# Patient Record
Sex: Male | Born: 1935
Health system: Southern US, Community
[De-identification: ages and names within clinical notes are randomized; demographics above are authoritative.]

## PROBLEM LIST (undated history)

## (undated) DIAGNOSIS — M109 Gout, unspecified: Secondary | ICD-10-CM

## (undated) DIAGNOSIS — F419 Anxiety disorder, unspecified: Secondary | ICD-10-CM

## (undated) DIAGNOSIS — I10 Essential (primary) hypertension: Secondary | ICD-10-CM

## (undated) DIAGNOSIS — N529 Male erectile dysfunction, unspecified: Secondary | ICD-10-CM

## (undated) DIAGNOSIS — G4700 Insomnia, unspecified: Secondary | ICD-10-CM

## (undated) DIAGNOSIS — F32A Depression, unspecified: Secondary | ICD-10-CM

## (undated) DIAGNOSIS — N400 Enlarged prostate without lower urinary tract symptoms: Secondary | ICD-10-CM

## (undated) DIAGNOSIS — F329 Major depressive disorder, single episode, unspecified: Secondary | ICD-10-CM

## (undated) DIAGNOSIS — E785 Hyperlipidemia, unspecified: Secondary | ICD-10-CM

## (undated) HISTORY — DX: Depression, unspecified: F32.A

## (undated) HISTORY — DX: Insomnia, unspecified: G47.00

## (undated) HISTORY — DX: Major depressive disorder, single episode, unspecified: F32.9

## (undated) HISTORY — DX: Anxiety disorder, unspecified: F41.9

## (undated) HISTORY — DX: Benign prostatic hyperplasia without lower urinary tract symptoms: N40.0

## (undated) HISTORY — DX: Hyperlipidemia, unspecified: E78.5

## (undated) HISTORY — DX: Male erectile dysfunction, unspecified: N52.9

## (undated) HISTORY — DX: Gout, unspecified: M10.9

## (undated) HISTORY — DX: Essential (primary) hypertension: I10

---

## 2007-03-26 ENCOUNTER — Ambulatory Visit: Payer: Self-pay | Admitting: Gastroenterology

## 2007-03-26 LAB — HM COLONOSCOPY

## 2012-07-18 ENCOUNTER — Encounter: Payer: Self-pay | Admitting: Nurse Practitioner

## 2012-07-18 ENCOUNTER — Encounter: Payer: Self-pay | Admitting: Cardiothoracic Surgery

## 2014-12-01 DIAGNOSIS — I10 Essential (primary) hypertension: Secondary | ICD-10-CM | POA: Diagnosis not present

## 2014-12-01 DIAGNOSIS — I129 Hypertensive chronic kidney disease with stage 1 through stage 4 chronic kidney disease, or unspecified chronic kidney disease: Secondary | ICD-10-CM | POA: Diagnosis not present

## 2014-12-01 DIAGNOSIS — F17200 Nicotine dependence, unspecified, uncomplicated: Secondary | ICD-10-CM | POA: Diagnosis not present

## 2014-12-01 DIAGNOSIS — E785 Hyperlipidemia, unspecified: Secondary | ICD-10-CM | POA: Diagnosis not present

## 2014-12-01 DIAGNOSIS — G4700 Insomnia, unspecified: Secondary | ICD-10-CM | POA: Diagnosis not present

## 2014-12-01 DIAGNOSIS — Z0000 Encounter for general adult medical examination without abnormal findings: Secondary | ICD-10-CM | POA: Diagnosis not present

## 2014-12-01 DIAGNOSIS — R8299 Other abnormal findings in urine: Secondary | ICD-10-CM | POA: Diagnosis not present

## 2015-05-05 DIAGNOSIS — I10 Essential (primary) hypertension: Secondary | ICD-10-CM | POA: Diagnosis not present

## 2015-05-05 DIAGNOSIS — E785 Hyperlipidemia, unspecified: Secondary | ICD-10-CM | POA: Diagnosis not present

## 2015-05-05 DIAGNOSIS — S51812A Laceration without foreign body of left forearm, initial encounter: Secondary | ICD-10-CM | POA: Diagnosis not present

## 2015-05-05 DIAGNOSIS — L233 Allergic contact dermatitis due to drugs in contact with skin: Secondary | ICD-10-CM | POA: Diagnosis not present

## 2015-05-05 DIAGNOSIS — T490X5A Adverse effect of local antifungal, anti-infective and anti-inflammatory drugs, initial encounter: Secondary | ICD-10-CM | POA: Diagnosis not present

## 2015-05-05 DIAGNOSIS — Z886 Allergy status to analgesic agent status: Secondary | ICD-10-CM | POA: Diagnosis not present

## 2015-05-05 DIAGNOSIS — S51822A Laceration with foreign body of left forearm, initial encounter: Secondary | ICD-10-CM | POA: Diagnosis not present

## 2015-05-05 DIAGNOSIS — R21 Rash and other nonspecific skin eruption: Secondary | ICD-10-CM | POA: Diagnosis not present

## 2015-05-05 DIAGNOSIS — L03114 Cellulitis of left upper limb: Secondary | ICD-10-CM | POA: Diagnosis not present

## 2015-05-05 DIAGNOSIS — Z792 Long term (current) use of antibiotics: Secondary | ICD-10-CM | POA: Diagnosis not present

## 2015-05-05 DIAGNOSIS — Z66 Do not resuscitate: Secondary | ICD-10-CM | POA: Diagnosis not present

## 2015-05-05 DIAGNOSIS — M799 Soft tissue disorder, unspecified: Secondary | ICD-10-CM | POA: Diagnosis not present

## 2015-05-05 DIAGNOSIS — Z7952 Long term (current) use of systemic steroids: Secondary | ICD-10-CM | POA: Diagnosis not present

## 2015-05-06 DIAGNOSIS — S51812A Laceration without foreign body of left forearm, initial encounter: Secondary | ICD-10-CM | POA: Insufficient documentation

## 2015-05-06 DIAGNOSIS — L03114 Cellulitis of left upper limb: Secondary | ICD-10-CM | POA: Insufficient documentation

## 2015-05-06 DIAGNOSIS — R21 Rash and other nonspecific skin eruption: Secondary | ICD-10-CM | POA: Insufficient documentation

## 2015-05-11 ENCOUNTER — Telehealth: Payer: Self-pay | Admitting: Unknown Physician Specialty

## 2015-05-11 DIAGNOSIS — R21 Rash and other nonspecific skin eruption: Secondary | ICD-10-CM | POA: Diagnosis not present

## 2015-05-11 DIAGNOSIS — L03114 Cellulitis of left upper limb: Secondary | ICD-10-CM | POA: Diagnosis not present

## 2015-05-11 DIAGNOSIS — I10 Essential (primary) hypertension: Secondary | ICD-10-CM | POA: Diagnosis not present

## 2015-05-11 DIAGNOSIS — S51812D Laceration without foreign body of left forearm, subsequent encounter: Secondary | ICD-10-CM | POA: Diagnosis not present

## 2015-05-11 NOTE — Telephone Encounter (Signed)
I looked up this patient in practice partner ([phone]) and I do not see anywhere that we have ever given him tramadol. Elnita MaxwellCheryl can you look and double check on this please?

## 2015-05-11 NOTE — Telephone Encounter (Signed)
[facility] was discharged form [facility] and has misplaced a rx for ultram and was wondering what he needed to do. He has already called [facility] but got the run around.

## 2015-05-11 NOTE — Telephone Encounter (Signed)
Called and spoke to a woman who said that the patient was with a nurse right now. I asked for the patient to call us back when he could.

## 2015-05-11 NOTE — Telephone Encounter (Signed)
I have not.  [facility]  I will not prescribe without an evaluation.

## 2015-05-13 DIAGNOSIS — I10 Essential (primary) hypertension: Secondary | ICD-10-CM | POA: Diagnosis not present

## 2015-05-13 DIAGNOSIS — R21 Rash and other nonspecific skin eruption: Secondary | ICD-10-CM | POA: Diagnosis not present

## 2015-05-13 DIAGNOSIS — S51812D Laceration without foreign body of left forearm, subsequent encounter: Secondary | ICD-10-CM | POA: Diagnosis not present

## 2015-05-13 DIAGNOSIS — L03114 Cellulitis of left upper limb: Secondary | ICD-10-CM | POA: Diagnosis not present

## 2015-05-13 NOTE — Telephone Encounter (Signed)
Called and left patient a voicemail asking for him to please return my call.  

## 2015-05-14 ENCOUNTER — Other Ambulatory Visit: Payer: Self-pay | Admitting: Unknown Physician Specialty

## 2015-05-16 NOTE — Telephone Encounter (Signed)
Called and left patient a voicemail asking for him to please return my call.  

## 2015-05-16 NOTE — Telephone Encounter (Signed)
Pt has appt with [facility] tomorrowand will take care of everything tomorrow

## 2015-05-17 DIAGNOSIS — S41112S Laceration without foreign body of left upper arm, sequela: Secondary | ICD-10-CM | POA: Diagnosis not present

## 2015-05-19 DIAGNOSIS — R21 Rash and other nonspecific skin eruption: Secondary | ICD-10-CM | POA: Diagnosis not present

## 2015-05-19 DIAGNOSIS — I10 Essential (primary) hypertension: Secondary | ICD-10-CM | POA: Diagnosis not present

## 2015-05-19 DIAGNOSIS — S51812D Laceration without foreign body of left forearm, subsequent encounter: Secondary | ICD-10-CM | POA: Diagnosis not present

## 2015-05-19 DIAGNOSIS — L03114 Cellulitis of left upper limb: Secondary | ICD-10-CM | POA: Diagnosis not present

## 2015-05-26 DIAGNOSIS — E291 Testicular hypofunction: Secondary | ICD-10-CM

## 2015-05-26 DIAGNOSIS — N400 Enlarged prostate without lower urinary tract symptoms: Secondary | ICD-10-CM | POA: Insufficient documentation

## 2015-05-26 DIAGNOSIS — E785 Hyperlipidemia, unspecified: Secondary | ICD-10-CM | POA: Insufficient documentation

## 2015-05-26 DIAGNOSIS — F32A Depression, unspecified: Secondary | ICD-10-CM | POA: Insufficient documentation

## 2015-05-26 DIAGNOSIS — F329 Major depressive disorder, single episode, unspecified: Secondary | ICD-10-CM | POA: Insufficient documentation

## 2015-05-26 DIAGNOSIS — G4700 Insomnia, unspecified: Secondary | ICD-10-CM | POA: Insufficient documentation

## 2015-05-26 DIAGNOSIS — F419 Anxiety disorder, unspecified: Secondary | ICD-10-CM

## 2015-05-26 DIAGNOSIS — M109 Gout, unspecified: Secondary | ICD-10-CM

## 2015-05-26 DIAGNOSIS — I10 Essential (primary) hypertension: Secondary | ICD-10-CM

## 2015-06-04 ENCOUNTER — Other Ambulatory Visit: Payer: Self-pay | Admitting: Unknown Physician Specialty

## 2015-06-08 ENCOUNTER — Ambulatory Visit: Payer: Self-pay | Admitting: Unknown Physician Specialty

## 2015-06-25 ENCOUNTER — Other Ambulatory Visit: Payer: Self-pay | Admitting: Unknown Physician Specialty

## 2015-10-22 ENCOUNTER — Other Ambulatory Visit: Payer: Self-pay | Admitting: Family Medicine

## 2015-10-23 ENCOUNTER — Other Ambulatory Visit: Payer: Self-pay | Admitting: Unknown Physician Specialty

## 2015-10-24 ENCOUNTER — Encounter: Payer: Self-pay | Admitting: Family Medicine

## 2015-10-24 NOTE — Telephone Encounter (Signed)
apt 

## 2015-10-24 NOTE — Telephone Encounter (Signed)
Letter sent.

## 2015-11-21 ENCOUNTER — Other Ambulatory Visit: Payer: Self-pay | Admitting: Family Medicine

## 2016-10-15 ENCOUNTER — Telehealth: Payer: Self-pay

## 2016-10-15 NOTE — Telephone Encounter (Signed)
Attempted to reach to schedule for Medicare Wellness. Message left for pt to return call.   

## 2018-07-10 DIAGNOSIS — H5203 Hypermetropia, bilateral: Secondary | ICD-10-CM | POA: Diagnosis not present

## 2018-07-10 DIAGNOSIS — H25813 Combined forms of age-related cataract, bilateral: Secondary | ICD-10-CM | POA: Diagnosis not present

## 2018-08-05 DIAGNOSIS — H25813 Combined forms of age-related cataract, bilateral: Secondary | ICD-10-CM | POA: Diagnosis not present

## 2018-12-08 ENCOUNTER — Inpatient Hospital Stay
Admission: EM | Admit: 2018-12-08 | Discharge: 2018-12-12 | DRG: 291 | Disposition: A | Discharge status: Home / Self Care | Payer: Medicare Other | Attending: Internal Medicine | Admitting: Internal Medicine

## 2018-12-08 ENCOUNTER — Emergency Department: Payer: Medicare Other

## 2018-12-08 ENCOUNTER — Ambulatory Visit: Payer: Self-pay | Admitting: *Deleted

## 2018-12-08 ENCOUNTER — Encounter: Payer: Self-pay | Admitting: Emergency Medicine

## 2018-12-08 ENCOUNTER — Inpatient Hospital Stay
Admit: 2018-12-08 | Discharge: 2018-12-08 | Disposition: A | Discharge status: Home / Self Care | Payer: Medicare Other | Attending: Nurse Practitioner | Admitting: Nurse Practitioner

## 2018-12-08 ENCOUNTER — Other Ambulatory Visit: Payer: Self-pay

## 2018-12-08 DIAGNOSIS — F419 Anxiety disorder, unspecified: Secondary | ICD-10-CM | POA: Diagnosis present

## 2018-12-08 DIAGNOSIS — M109 Gout, unspecified: Secondary | ICD-10-CM | POA: Diagnosis present

## 2018-12-08 DIAGNOSIS — Z881 Allergy status to other antibiotic agents status: Secondary | ICD-10-CM

## 2018-12-08 DIAGNOSIS — J9801 Acute bronchospasm: Secondary | ICD-10-CM | POA: Diagnosis present

## 2018-12-08 DIAGNOSIS — F329 Major depressive disorder, single episode, unspecified: Secondary | ICD-10-CM | POA: Diagnosis present

## 2018-12-08 DIAGNOSIS — J9811 Atelectasis: Secondary | ICD-10-CM | POA: Diagnosis not present

## 2018-12-08 DIAGNOSIS — Z66 Do not resuscitate: Secondary | ICD-10-CM | POA: Diagnosis present

## 2018-12-08 DIAGNOSIS — I10 Essential (primary) hypertension: Secondary | ICD-10-CM | POA: Diagnosis not present

## 2018-12-08 DIAGNOSIS — F1010 Alcohol abuse, uncomplicated: Secondary | ICD-10-CM | POA: Diagnosis present

## 2018-12-08 DIAGNOSIS — J449 Chronic obstructive pulmonary disease, unspecified: Secondary | ICD-10-CM | POA: Diagnosis present

## 2018-12-08 DIAGNOSIS — Z885 Allergy status to narcotic agent status: Secondary | ICD-10-CM

## 2018-12-08 DIAGNOSIS — J90 Pleural effusion, not elsewhere classified: Secondary | ICD-10-CM | POA: Diagnosis not present

## 2018-12-08 DIAGNOSIS — Z79890 Hormone replacement therapy (postmenopausal): Secondary | ICD-10-CM

## 2018-12-08 DIAGNOSIS — R000 Tachycardia, unspecified: Secondary | ICD-10-CM | POA: Diagnosis not present

## 2018-12-08 DIAGNOSIS — J9600 Acute respiratory failure, unspecified whether with hypoxia or hypercapnia: Secondary | ICD-10-CM | POA: Diagnosis present

## 2018-12-08 DIAGNOSIS — I4891 Unspecified atrial fibrillation: Secondary | ICD-10-CM | POA: Diagnosis present

## 2018-12-08 DIAGNOSIS — I110 Hypertensive heart disease with heart failure: Secondary | ICD-10-CM | POA: Diagnosis not present

## 2018-12-08 DIAGNOSIS — Z79899 Other long term (current) drug therapy: Secondary | ICD-10-CM | POA: Diagnosis not present

## 2018-12-08 DIAGNOSIS — E785 Hyperlipidemia, unspecified: Secondary | ICD-10-CM | POA: Diagnosis present

## 2018-12-08 DIAGNOSIS — Z87891 Personal history of nicotine dependence: Secondary | ICD-10-CM | POA: Diagnosis not present

## 2018-12-08 DIAGNOSIS — G4700 Insomnia, unspecified: Secondary | ICD-10-CM | POA: Diagnosis not present

## 2018-12-08 DIAGNOSIS — I429 Cardiomyopathy, unspecified: Secondary | ICD-10-CM | POA: Diagnosis not present

## 2018-12-08 DIAGNOSIS — I5021 Acute systolic (congestive) heart failure: Secondary | ICD-10-CM | POA: Diagnosis present

## 2018-12-08 DIAGNOSIS — N179 Acute kidney failure, unspecified: Secondary | ICD-10-CM | POA: Diagnosis present

## 2018-12-08 DIAGNOSIS — I5033 Acute on chronic diastolic (congestive) heart failure: Secondary | ICD-10-CM | POA: Diagnosis not present

## 2018-12-08 DIAGNOSIS — J9601 Acute respiratory failure with hypoxia: Secondary | ICD-10-CM | POA: Diagnosis not present

## 2018-12-08 DIAGNOSIS — Z20828 Contact with and (suspected) exposure to other viral communicable diseases: Secondary | ICD-10-CM | POA: Diagnosis present

## 2018-12-08 DIAGNOSIS — R0602 Shortness of breath: Secondary | ICD-10-CM

## 2018-12-08 DIAGNOSIS — R079 Chest pain, unspecified: Secondary | ICD-10-CM | POA: Diagnosis not present

## 2018-12-08 DIAGNOSIS — I480 Paroxysmal atrial fibrillation: Secondary | ICD-10-CM | POA: Diagnosis not present

## 2018-12-08 DIAGNOSIS — N400 Enlarged prostate without lower urinary tract symptoms: Secondary | ICD-10-CM | POA: Diagnosis present

## 2018-12-08 DIAGNOSIS — Z7982 Long term (current) use of aspirin: Secondary | ICD-10-CM

## 2018-12-08 LAB — CBC WITH DIFFERENTIAL/PLATELET
Abs Immature Granulocytes: 0.05 10*3/uL (ref 0.00–0.07)
Basophils Absolute: 0.1 10*3/uL (ref 0.0–0.1)
Basophils Relative: 0 %
Eosinophils Absolute: 0.2 10*3/uL (ref 0.0–0.5)
Eosinophils Relative: 2 %
HCT: 46.7 % (ref 39.0–52.0)
Hemoglobin: 15.4 g/dL (ref 13.0–17.0)
Immature Granulocytes: 0 %
Lymphocytes Relative: 11 %
Lymphs Abs: 1.5 10*3/uL (ref 0.7–4.0)
MCH: 31.6 pg (ref 26.0–34.0)
MCHC: 33 g/dL (ref 30.0–36.0)
MCV: 95.7 fL (ref 80.0–100.0)
Monocytes Absolute: 1.2 10*3/uL — ABNORMAL HIGH (ref 0.1–1.0)
Monocytes Relative: 9 %
Neutro Abs: 10.8 10*3/uL — ABNORMAL HIGH (ref 1.7–7.7)
Neutrophils Relative %: 78 %
Platelets: 253 10*3/uL (ref 150–400)
RBC: 4.88 MIL/uL (ref 4.22–5.81)
RDW: 12.7 % (ref 11.5–15.5)
WBC: 13.9 10*3/uL — ABNORMAL HIGH (ref 4.0–10.5)
nRBC: 0 % (ref 0.0–0.2)

## 2018-12-08 LAB — COMPREHENSIVE METABOLIC PANEL
ALT: 18 U/L (ref 0–44)
AST: 24 U/L (ref 15–41)
Albumin: 4.1 g/dL (ref 3.5–5.0)
Alkaline Phosphatase: 53 U/L (ref 38–126)
Anion gap: 15 (ref 5–15)
BUN: 28 mg/dL — ABNORMAL HIGH (ref 8–23)
CO2: 18 mmol/L — ABNORMAL LOW (ref 22–32)
Calcium: 8.6 mg/dL — ABNORMAL LOW (ref 8.9–10.3)
Chloride: 108 mmol/L (ref 98–111)
Creatinine, Ser: 1.48 mg/dL — ABNORMAL HIGH (ref 0.61–1.24)
GFR calc Af Amer: 50 mL/min — ABNORMAL LOW (ref >60)
GFR calc non Af Amer: 43 mL/min — ABNORMAL LOW (ref >60)
Glucose, Bld: 129 mg/dL — ABNORMAL HIGH (ref 70–99)
Potassium: 3.8 mmol/L (ref 3.5–5.1)
Sodium: 141 mmol/L (ref 135–145)
Total Bilirubin: 1.1 mg/dL (ref 0.3–1.2)
Total Protein: 7 g/dL (ref 6.5–8.1)

## 2018-12-08 LAB — TROPONIN I
Troponin I: <0.03 ng/mL (ref <0.03)
Troponin I: <0.03 ng/mL (ref <0.03)
Troponin I: <0.03 ng/mL (ref <0.03)

## 2018-12-08 LAB — PROTIME-INR
INR: 1.1 (ref 0.8–1.2)
Prothrombin Time: 14 seconds (ref 11.4–15.2)

## 2018-12-08 LAB — SARS CORONAVIRUS 2 BY RT PCR (HOSPITAL ORDER, PERFORMED IN ~~LOC~~ HOSPITAL LAB): SARS Coronavirus 2: NEGATIVE

## 2018-12-08 LAB — URINE DRUG SCREEN, QUALITATIVE (ARMC ONLY)
Amphetamines, Ur Screen: NOT DETECTED
Barbiturates, Ur Screen: NOT DETECTED
Benzodiazepine, Ur Scrn: NOT DETECTED
Cannabinoid 50 Ng, Ur ~~LOC~~: NOT DETECTED
Cocaine Metabolite,Ur ~~LOC~~: NOT DETECTED
MDMA (Ecstasy)Ur Screen: NOT DETECTED
Methadone Scn, Ur: NOT DETECTED
Opiate, Ur Screen: NOT DETECTED
Phencyclidine (PCP) Ur S: NOT DETECTED
Tricyclic, Ur Screen: NOT DETECTED

## 2018-12-08 LAB — URINALYSIS, COMPLETE (UACMP) WITH MICROSCOPIC
Bilirubin Urine: NEGATIVE
Glucose, UA: NEGATIVE mg/dL
Hgb urine dipstick: NEGATIVE
Ketones, ur: 20 mg/dL — AB
Nitrite: NEGATIVE
Protein, ur: 30 mg/dL — AB
Specific Gravity, Urine: 1.021 (ref 1.005–1.030)
WBC, UA: >50 WBC/hpf — ABNORMAL HIGH (ref 0–5)
pH: 5 (ref 5.0–8.0)

## 2018-12-08 LAB — APTT: aPTT: 33 seconds (ref 24–36)

## 2018-12-08 LAB — ECHOCARDIOGRAM COMPLETE
Height: 66 in
Weight: 2464 oz

## 2018-12-08 MED ORDER — ONDANSETRON HCL 4 MG/2ML IJ SOLN: 4.0000 mg | Freq: Four times a day (QID) | INTRAMUSCULAR | Status: DC | PRN

## 2018-12-08 MED ORDER — ENOXAPARIN SODIUM 100 MG/ML ~~LOC~~ SOLN
1.0000 mg/kg | Freq: Two times a day (BID) | SUBCUTANEOUS | Status: DC
  Administered 2018-12-08 – 2018-12-09 (×2): 95 mg via SUBCUTANEOUS
  Filled 2018-12-08 (×2): qty 1

## 2018-12-08 MED ORDER — DILTIAZEM HCL 100 MG IV SOLR
5.0000 mg/h | INTRAVENOUS | Status: DC
  Administered 2018-12-08: 12.5 mg/h via INTRAVENOUS
  Administered 2018-12-08: 10 mg/h via INTRAVENOUS
  Administered 2018-12-09: 12.5 mg/h via INTRAVENOUS
  Filled 2018-12-08 (×3): qty 100

## 2018-12-08 MED ORDER — ZOLPIDEM TARTRATE 5 MG PO TABS
5.0000 mg | ORAL_TABLET | Freq: Every evening | ORAL | Status: DC | PRN
  Administered 2018-12-08 – 2018-12-10 (×3): 5 mg via ORAL
  Filled 2018-12-08 (×4): qty 1

## 2018-12-08 MED ORDER — FERROUS GLUCONATE 324 (38 FE) MG PO TABS
324.0000 mg | ORAL_TABLET | Freq: Every day | ORAL | Status: DC
  Administered 2018-12-09 – 2018-12-12 (×4): 324 mg via ORAL
  Filled 2018-12-08 (×4): qty 1

## 2018-12-08 MED ORDER — DILTIAZEM HCL 25 MG/5ML IV SOLN
15.0000 mg | Freq: Once | INTRAVENOUS | Status: AC
  Administered 2018-12-08: 15 mg via INTRAVENOUS
  Filled 2018-12-08: qty 5

## 2018-12-08 MED ORDER — ACETAMINOPHEN 325 MG PO TABS
650.0000 mg | ORAL_TABLET | ORAL | Status: DC | PRN
  Administered 2018-12-08 – 2018-12-12 (×4): 650 mg via ORAL
  Filled 2018-12-08 (×4): qty 2

## 2018-12-08 MED ORDER — SIMVASTATIN 10 MG PO TABS
20.0000 mg | ORAL_TABLET | Freq: Every day | ORAL | Status: DC
  Administered 2018-12-08 – 2018-12-11 (×4): 20 mg via ORAL
  Filled 2018-12-08 (×6): qty 2

## 2018-12-08 NOTE — ED Triage Notes (Signed)
Pt from home via EMS with c/o CP x3months, was told by his PCP to come to ER. Per EMS pt was a-fib, hr 90s-150s. PT has no hx of afib. PT has noted wheezing. States SOB with exertion . A&Ox4

## 2018-12-08 NOTE — ED Notes (Signed)
diltiazen titrated to 51ml/hr

## 2018-12-08 NOTE — Consult Note (Signed)
ANTICOAGULATION CONSULT NOTE - Initial Consult  Pharmacy Consult for Lovenox Dosing Indication: atrial fibrillation  Allergies  Allergen Reactions  . Bactrim [Sulfamethoxazole-Trimethoprim] Itching  . Codeine     Patient Measurements: Weight: 210 lb (95.3 kg) Heparin Dosing Weight: N/A  Vital Signs: Temp Source: Oral (06/01 1040) BP: 131/107 (06/01 1330) Pulse Rate: 62 (06/01 1330)  Labs: Recent Labs    12/08/18 1046  HGB 15.4  HCT 46.7  PLT 253  APTT 33  LABPROT 14.0  INR 1.1  CREATININE 1.48*  TROPONINI <0.03    CrCl cannot be calculated (Unknown ideal weight.).   Medical History: Past Medical History:  Diagnosis Date  . Anxiety   . BPH (benign prostatic hypertrophy)   . Depression   . ED (erectile dysfunction)   . Gout   . Hyperlipidemia   . Hypertension   . Insomnia     Medications:  (Not in a hospital admission)  Scheduled:  . enoxaparin (LOVENOX) injection  1 mg/kg Subcutaneous Q12H  . [START ON 12/09/2018] ferrous gluconate  324 mg Oral Q breakfast  . simvastatin  20 mg Oral q1800   Infusions:  . diltiazem (CARDIZEM) infusion 10 mg/hr (12/08/18 1059)   PRN: acetaminophen, ondansetron (ZOFRAN) IV  Assessment: Pharmacy consulted for initiation of Lovenox dosing in patient with Afib. Patient has no prior record of having condition and was not on any prior anticoagulation, therefore will start therapy immediately. Baseline Hgb level= 15.4, aPTT=33, Protime/INR=1.1.    Goal of Therapy:  Prevention of Cardiovascular Complications   Plan:  Lovenox 95mg  subcutaneous injection Q12 hours based off recommended dosing of 1mg /kg for Afib treatment.  Will monitor renal function and CBC daily and at least every 3 days per protocol.   Bettey Costa, PharmD Clinical Pharmacist 12/08/2018 2:51 PM

## 2018-12-08 NOTE — Telephone Encounter (Signed)
Pt and his daughter in law called with the patient having chest pain, shortness of breath and pain facial pain. He is having visible shortness of breath.  Pt states it hurts to take a deep breath in his stomach. He wheezes at night. Advised her to call 911, she voiced understanding.

## 2018-12-08 NOTE — ED Provider Notes (Signed)
[facility] Emergency Department Provider Note   ____________________________________________    I have reviewed the triage vital signs and the nursing notes.   HISTORY  Chief Complaint Chest Pain     HPI Caleb Fleming. is a 85 y.o. male with a history as noted below who presents with complaints of mild chest tightness and shortness of breath over the last 2 to 3 days.  He reports he feels quite winded especially with any exertion but also at rest.  He is never had this before.  He denies fevers or chills.  No cough.  No nausea or vomiting or diaphoresis.  No recent travel.  No calf pain or swelling.  Is not take anything for this  Past Medical History:  Diagnosis Date  . Anxiety   . BPH (benign prostatic hypertrophy)   . Depression   . ED (erectile dysfunction)   . Gout   . Hyperlipidemia   . Hypertension   . Insomnia     Patient Active Problem List   Diagnosis Date Noted  . BPH (benign prostatic hypertrophy) 05/26/2015  . Gout 05/26/2015  . Hyperlipidemia 05/26/2015  . Insomnia 05/26/2015  . Hypogonadism in male 05/26/2015  . Hypertension 05/26/2015  . Anxiety and depression 05/26/2015    History reviewed. No pertinent surgical history.  Prior to Admission medications   Medication Sig Start Date End Date Taking? Authorizing Provider  acetaminophen (TYLENOL) 325 MG tablet Take 650 mg by mouth every 4 (four) hours as needed. 05/09/15  Yes [provider]  amLODipine (NORVASC) 5 MG tablet TAKE ONE TABLET BY MOUTH ONCE DAILY 11/21/15   Steele Sizer, MD  aspirin 81 MG tablet Take 81 mg by mouth daily.    [provider]  Aspirin-Calcium Carbonate 81-777 MG TABS Take 1 tablet by mouth daily.    [provider]  atenolol (TENORMIN) 25 MG tablet TAKE 1 TABLET BY MOUTH DAILY 06/06/15   Gabriel Cirri, NP  Cholecalciferol (VITAMIN D3) 25 MCG (1000 UT) CAPS Take 1,000 Units by mouth daily.    [provider]  citalopram (CELEXA) 20 MG tablet 1 TABLET AT BED-TIME ORAL 05/16/15   Gabriel Cirri, NP  ferrous gluconate (FERGON) 324 MG tablet Take 324 mg by mouth daily with breakfast.    [provider]  Melatonin 300 MCG TABS Take 300 mcg by mouth daily.    [provider]  Misc Natural Products (TUMERSAID) TABS Take 1 tablet by mouth daily.    [provider]  Omega-3 1000 MG CAPS Take 2 g by mouth daily.    [provider]  simvastatin (ZOCOR) 20 MG tablet 1 TABLET ONCE EACH DAY ORAL 10/24/15   Gabriel Cirri, NP  vitamin C (ASCORBIC ACID) 500 MG tablet Take 500 mg by mouth daily.    [provider]     Allergies Bactrim [sulfamethoxazole-trimethoprim] and Codeine  Family History  Problem Relation Age of Onset  . Cancer Mother   . Cancer Father     Social History Social History   Tobacco Use  . Smoking status: Former Games developer  . Smokeless tobacco: Never Used  Substance Use Topics  . Alcohol use: Not Currently    Alcohol/week: 0.0 standard drinks  . Drug use: Never    Review of Systems  Constitutional: No fever/chills Eyes: No visual changes.  ENT: No sore throat. Cardiovascular: As above Respiratory: As above Gastrointestinal: No abdominal pain Genitourinary: Negative for dysuria. Musculoskeletal: Negative for  back pain. Skin: Negative for rash. Neurological: Negative for headaches   ____________________________________________   PHYSICAL EXAM:  VITAL SIGNS: ED Triage Vitals [12/08/18 1040]  Enc Vitals Group     BP      Pulse Rate (!) 175     Resp (!) 24     Temp      Temp Source Oral     SpO2      Weight      Height      Head Circumference      Peak Flow      Pain Score 0     Pain Loc      Pain Edu?      Excl. in GC?     Constitutional: Alert and oriented.   Mouth/Throat: Mucous membranes are moist.    Cardiovascular: Tachycardia, regular rhythm. Grossly normal heart sounds.  Good peripheral  circulation. Respiratory: Normal respiratory effort.  No retractions.  Bibasilar Rales Gastrointestinal: Soft and nontender. No distention.  No CVA tenderness. Genitourinary: deferred Musculoskeletal: No lower extremity tenderness nor edema.  Warm and well perfused Neurologic:  Normal speech and language. No gross focal neurologic deficits are appreciated.  Skin:  Skin is warm, dry and intact. No rash noted. Psychiatric: Mood and affect are normal. Speech and behavior are normal.  ____________________________________________   LABS (all labs ordered are listed, but only abnormal results are displayed)  Labs Reviewed  CBC WITH DIFFERENTIAL/PLATELET - Abnormal; Notable for the following components:      Result Value   WBC 13.9 (*)    Neutro Abs 10.8 (*)    Monocytes Absolute 1.2 (*)    All other components within normal limits  COMPREHENSIVE METABOLIC PANEL - Abnormal; Notable for the following components:   CO2 18 (*)    Glucose, Bld 129 (*)    BUN 28 (*)    Creatinine, Ser 1.48 (*)    Calcium 8.6 (*)    GFR calc non Af Amer 43 (*)    GFR calc Af Amer 50 (*)    All other components within normal limits  SARS CORONAVIRUS 2 (HOSPITAL ORDER, PERFORMED IN [facility])  TROPONIN I  APTT  PROTIME-INR   ____________________________________________  EKG  ED ECG REPORT I, Jene Every, the attending physician, personally viewed and interpreted this ECG.  Date: 12/08/2018  Rhythm: Atrial fibrillation QRS Axis: normal Intervals: Abnormal ST/T Wave abnormalities: normal Narrative Interpretation: Atrial fibrillation with RVR  ____________________________________________  RADIOLOGY  Chest x-ray pending ____________________________________________   PROCEDURES  Procedure(s) performed: No  Procedures   Critical Care performed: yes  CRITICAL CARE Performed by: Jene Every   Total critical care time:30 minutes  Critical care time was exclusive of  separately billable procedures and treating other patients.  Critical care was necessary to treat or prevent imminent or life-threatening deterioration.  Critical care was time spent personally by me on the following activities: development of treatment plan with patient and/or surrogate as well as nursing, discussions with consultants, evaluation of patient's response to treatment, examination of patient, obtaining history from patient or surrogate, ordering and performing treatments and interventions, ordering and review of laboratory studies, ordering and review of radiographic studies, pulse oximetry and re-evaluation of patient's condition.  ____________________________________________   INITIAL IMPRESSION / ASSESSMENT AND PLAN / ED COURSE  Pertinent labs & imaging results that were available during my care of the patient were reviewed by me and considered in my medical decision making (see chart for details).  Patient  presents with shortness of breath, notable tachycardia.  EKG is most consistent with atrial fibrillation with RVR.  I suspect this is the cause of his symptoms.  We will give IV Cardizem given blood pressure of 150 systolic and start Cardizem drip.  Patient's heart rate is improving on a Cardizem drip, blood pressure remained stable.  Lab work is overall reassuring.  I discussed with hospitalist for admission    ____________________________________________   FINAL CLINICAL IMPRESSION(S) / ED DIAGNOSES  Final diagnoses:  Atrial fibrillation with RVR (HCC)        Note:  This document was prepared using Dragon voice recognition software and may include unintentional dictation errors.   Jene EveryKinner, Kortney Schoenfelder, MD 12/08/18 1243

## 2018-12-08 NOTE — H&P (Addendum)
[facility] [place] [facility]   PATIENT NAME: Caleb Fleming    MR#:  [id number]  DATE OF BIRTH:  [birth year]  DATE OF ADMISSION:  12/08/2018  PRIMARY CARE PHYSICIAN: Steele Sizerrissman, Mark A, MD   REQUESTING/REFERRING PHYSICIAN:   CHIEF COMPLAINT:   Chief Complaint  Patient presents with  . Chest Pain   HISTORY OF PRESENT ILLNESS:  Caleb Samrnest Hoskin is a 85 y.o. male with a known history of hyperlipidemia, depression and anxiety, hypertension and insomnia presenting to the ED with chief complaints of chest pain, shortness of breath and facial pain.  Patient reports that he has been having intermittent chest pain x3 months associated shortness of breath.  He reports the symptoms have been worse over the last 2 to 3 days.  He denies associated symptoms of diaphoresis, nausea or vomiting, dizziness, cough, fevers or chills.  He called his PCP today who advised him to go to the ED for further evaluation.  On arrival to the ED, he was afebrile with blood pressure 157/139 mm Hg and pulse rate 175 beats/min. There were no focal neurological deficits; he was alert and oriented x4.  In the ED he was noted to be short of breath at rest and audibly wheezing.  EKG showed atrial fibrillation with RVR.  Patient was given IV Cardizem bolus and started on Cardizem drip for rate control.  Initial labs reveal elevated white count of 13.9, BUN 28, creatinine 1.48, troponin <0.03, SARS Coronavirus 2 negative.  Chest x-ray showed small pleural effusion with bibasilar atelectasis, no frank edema or consolidation.  Patient is currently being admitted to hospitalist service for further management.  PAST MEDICAL HISTORY:   Past Medical History:  Diagnosis Date  . Anxiety   . BPH (benign prostatic hypertrophy)   . Depression   . ED (erectile dysfunction)   . Gout   . Hyperlipidemia   . Hypertension   . Insomnia     PAST SURGICAL HISTORY:  History reviewed. No pertinent surgical  history.  SOCIAL HISTORY:   Social History   Tobacco Use  . Smoking status: Former Games developermoker  . Smokeless tobacco: Never Used  Substance Use Topics  . Alcohol use: Not Currently    Alcohol/week: 0.0 standard drinks    FAMILY HISTORY:   Family History  Problem Relation Age of Onset  . Cancer Mother   . Cancer Father     DRUG ALLERGIES:   Allergies  Allergen Reactions  . Bactrim [Sulfamethoxazole-Trimethoprim] Itching  . Codeine     REVIEW OF SYSTEMS:   Review of Systems  Constitutional: Negative for chills, fever, malaise/fatigue and weight loss.  HENT: Positive for hearing loss. Negative for congestion and sore throat.   Eyes: Negative for blurred vision and double vision.  Respiratory: Negative for cough, shortness of breath and wheezing.   Cardiovascular: Positive for chest pain and palpitations. Negative for orthopnea and leg swelling.  Gastrointestinal: Negative for abdominal pain, diarrhea, nausea and vomiting.  Genitourinary: Negative for dysuria and urgency.  Musculoskeletal: Negative for myalgias.  Skin: Negative for rash.  Neurological: Negative for dizziness, sensory change, speech change, focal weakness and headaches.  Psychiatric/Behavioral: Positive for depression. The patient is nervous/anxious and has insomnia.    MEDICATIONS AT HOME:   Prior to Admission medications   Medication Sig Start Date End Date Taking? Authorizing Provider  acetaminophen (TYLENOL) 325 MG tablet Take 650 mg by mouth every 4 (four) hours as needed. 05/09/15  Yes [provider]  amLODipine (  NORVASC) 5 MG tablet TAKE ONE TABLET BY MOUTH ONCE DAILY 11/21/15  Yes Crissman, Redge Gainer, MD  aspirin 81 MG tablet Take 81 mg by mouth daily.   Yes [provider]  atenolol (TENORMIN) 25 MG tablet TAKE 1 TABLET BY MOUTH DAILY 06/06/15  Yes Gabriel Cirri, NP  Cholecalciferol (VITAMIN D3) 25 MCG (1000 UT) CAPS Take 1,000 Units by mouth daily.   Yes [provider]   citalopram (CELEXA) 20 MG tablet 1 TABLET AT BED-TIME ORAL 05/16/15  Yes Gabriel Cirri, NP  ferrous gluconate (FERGON) 324 MG tablet Take 324 mg by mouth daily with breakfast.   Yes [provider]  Misc Natural Products (TUMERSAID) TABS Take 1 tablet by mouth daily.   Yes [provider]  Omega-3 1000 MG CAPS Take 2 g by mouth daily.   Yes [provider]  simvastatin (ZOCOR) 20 MG tablet 1 TABLET ONCE EACH DAY ORAL 10/24/15  Yes Gabriel Cirri, NP  vitamin C (ASCORBIC ACID) 500 MG tablet Take 500 mg by mouth daily.   Yes [provider]     VITAL SIGNS:  Blood pressure (!) 131/107, pulse 62, resp. rate (!) 23, SpO2 98 %.  PHYSICAL EXAMINATION:   Physical Exam  GENERAL:  85 y.o.-year-old patient lying in the bed with no acute distress.  EYES: Pupils equal, round, reactive to light and accommodation. No scleral icterus. Extraocular muscles intact.  HEENT: Head atraumatic, normocephalic. Oropharynx and nasopharynx clear.  NECK:  Supple, no jugular venous distention. No thyroid enlargement, no tenderness.  LUNGS: Decreased breath sounds bilaterally, mild wheezing bilaterally, rales,rhonchi or crepitation. No use of accessory muscles of respiration.  CARDIOVASCULAR: S1, S2 normal. No murmurs, Irregular. No rubs, or gallops.  ABDOMEN: Soft, nontender, nondistended. Bowel sounds present. No organomegaly or mass.  EXTREMITIES: No pedal edema, cyanosis, or clubbing.  NEUROLOGIC: Mental Status:Alert, oriented, thought content appropriate.  Speech fluent without evidence of aphasia.  Able to follow 3 step commands without difficulty. Attention span and concentration seemed appropriate  Cranial Nerves: II: Discs flat bilaterally; Visual fields grossly normal, pupils equal, round, reactive to light and accommodation III,IV, VI: ptosis not present, extra-ocular motions intact bilaterally V,VII: smile symmetric, facial light touch sensation intact VIII: hearing  normal bilaterally IX,X: gag reflex present XI: bilateral shoulder shrug XII: midline tongue extension Muscle strength 5/5 in all extremities.  Gait: not tested due to safety concerns PSYCHIATRIC: The patient is alert and oriented x 3.  SKIN: No obvious rash, lesion, or ulcer.   DATA REVIEWED:  LABORATORY PANEL:   CBC Recent Labs  Lab 12/08/18 1046  WBC 13.9*  HGB 15.4  HCT 46.7  PLT 253   ------------------------------------------------------------------------------------------------------------------  Chemistries  Recent Labs  Lab 12/08/18 1046  NA 141  K 3.8  CL 108  CO2 18*  GLUCOSE 129*  BUN 28*  CREATININE 1.48*  CALCIUM 8.6*  AST 24  ALT 18  ALKPHOS 53  BILITOT 1.1   ------------------------------------------------------------------------------------------------------------------  Cardiac Enzymes Recent Labs  Lab 12/08/18 1046  TROPONINI <0.03   ------------------------------------------------------------------------------------------------------------------  RADIOLOGY:  Dg Chest Port 1 View  Result Date: 12/08/2018 CLINICAL DATA:  Shortness of breath EXAM: PORTABLE CHEST 1 VIEW COMPARISON:  None. FINDINGS: There are small pleural effusions bilaterally with bibasilar atelectasis. There is no edema or consolidation. Heart is upper normal in size with pulmonary vascularity normal. No adenopathy. There old healed rib fractures on the right. IMPRESSION: Small pleural effusions with bibasilar atelectasis. No frank edema or consolidation. Heart upper  normal in size. Electronically Signed   By: Bretta Bang III M.D.   On: 12/08/2018 11:17    EKG:  EKG: atrial fibrillation with RVR. Vent. rate 143 BPM PR interval * ms QRS duration 76 ms QT/QTc 320/494 ms P-R-T axes * 70 72 IMPRESSION AND PLAN:   85 y.o. male with a known history of hyperlipidemia, depression and anxiety, hypertension and insomnia presenting to the ED with chief complaints of chest  pain, shortness of breath and facial pain.  1. New onset Atrial Fibrillation with RVR -unclear etiology, no history of MI, CAD or COPD, no use of stimulants. - Admit to telemetry unit - TTEcho to evaluate  any structural abnormalities (atrial dilation, valvular disease, infiltrative, ischemia, etc.) - Chest x-ray showed small pleural effusion with bibasilar atelectasis - EKG+Telemetry - Trend troponins - TSH, FT4 - EtOH + utox - Diltiazem 0.25mg /kg ( ) IV x1 then maintain 5-15mg /hr drip - Keep NPO for option of TEEcho + DCCardioversion - Cardiology Consultation for other regimens, ablation, pacer strategies, etc.  2. AKI -BUN/Cr elevated - Likely prerenal, will check UA/utox - Avoid nephrotoxins    3. HLD + Goal LDL<100 - Simvastatin  PO qhs  4. HTN + Goal BP <130/80 - Beta-blocker: Atenolol held due to RVR - Hold Norvasc for now will reconsider restarting when hemodynamically stable  5. Thromboembolism Risk Management CHADS2 = 2 - Lovenox    All the records are reviewed and case discussed with ED provider. Management plans discussed with the patient, family and they are in agreement.  CODE STATUS: DNR  TOTAL TIME TAKING CARE OF THIS PATIENT: 45 minutes.    on 12/08/2018 at 2:08 PM  This patient was staffed with Dr. Enid Baas, Jude who personally evaluated patient, reviewed documentation and agreed with assessment and plan of care as above.  Webb Silversmith, DNP, FNP-BC [facility] Nurse Practitioner Between 7am to 6pm - Pager [phone]  After 6pm go to [email or web address] - Social research officer, government  [facility]  Office  [phone]  CC: Primary care physician; Steele Sizer, MD

## 2018-12-08 NOTE — ED Notes (Signed)
Patient reports feeling tired and short of breath.  Patient states he began feeling weak 2-3 days ago.

## 2018-12-08 NOTE — Telephone Encounter (Signed)
  Reason for Disposition . Sounds like a life-threatening emergency to the triager  Protocols used: CHEST PAIN-A-AH

## 2018-12-08 NOTE — ED Notes (Signed)
Assumed care of patient aox4, denies pain, reports just feeling weak and tired. Cardizem drip infusing rate afib btwn 127-150's. States felling a little sob 2lnc applied, patient sating 99%. Will continue to monitor.

## 2018-12-08 NOTE — ED Notes (Signed)
MD at bedside. 

## 2018-12-08 NOTE — ED Notes (Signed)
ED TO INPATIENT HANDOFF REPORT  ED Nurse Name and Phone #:   S Name/Age/Gender Caleb ShiverErnest E Carmicheal Jr. 85 y.o. male Room/Bed: ED07A/ED07A  Code Status   Code Status: DNR  Home/SNF/Other Home Patient oriented to: self, place, time and situation Is this baseline? Yes   Triage Complete: Triage complete  Chief Complaint chest discomfort ems  Triage Note Pt from home via EMS with c/o CP [phone], was told by his PCP to come to ER. Per EMS pt was a-fib, hr 90s-150s. PT has no hx of afib. PT has noted wheezing. States SOB with exertion . A&Ox4   Allergies Allergies  Allergen Reactions  . Bactrim [Sulfamethoxazole-Trimethoprim] Itching  . Codeine     Level of Care/Admitting Diagnosis ED Disposition    ED Disposition Condition Comment   Admit  Hospital Area[facility] [[id number]]  Level of Care: Telemetry [5]  Covid Evaluation: Confirmed COVID Negative  Diagnosis: Atrial fibrillation with rapid ventricular response [facility](HCC) [[id number]]  Admitting Physician: Cristie HemUMA, ELIZABETH ACHIENG [[id number]]  Attending Physician: Jama FlavorsJIE, JUDE [[id number]]  Estimated length of stay: past midnight tomorrow  Certification:: I certify this patient will need inpatient services for at least 2 midnights  PT Class (Do Not Modify): Inpatient [101]  PT Acc Code (Do Not Modify): Private [1]       B Medical/Surgery History Past Medical History:  Diagnosis Date  . Anxiety   . BPH (benign prostatic hypertrophy)   . Depression   . ED (erectile dysfunction)   . Gout   . Hyperlipidemia   . Hypertension   . Insomnia    History reviewed. No pertinent surgical history.   A IV Location/Drains/Wounds Patient Lines/Drains/Airways Status   Active Line/Drains/Airways    Name:   Placement date:   Placement time:   Site:   Days:   Peripheral IV 12/08/18 Right Antecubital   12/08/18    1046    Antecubital   less than 1   Peripheral IV 12/08/18 Left Antecubital   12/08/18    1034    Antecubital    less than 1          Intake/Output Last 24 hours No intake or output data in the 24 hours ending 12/08/18 1549  Labs/Imaging Results for orders placed or performed during the hospital encounter of 12/08/18 (from the past 48 hour(s))  CBC with Differential     Status: Abnormal   Collection Time: 12/08/18 10:46 AM  Result Value Ref Range   WBC 13.9 (H) 4.0 - 10.5 K/uL   RBC 4.88 4.22 - 5.81 MIL/uL   Hemoglobin 15.4 13.0 - 17.0 g/dL   HCT 04.546.7 40.939.0 - 81.152.0 %   MCV 95.7 80.0 - 100.0 fL   MCH 31.6 26.0 - 34.0 pg   MCHC 33.0 30.0 - 36.0 g/dL   RDW 91.412.7 78.211.5 - 95.615.5 %   Platelets 253 150 - 400 K/uL   nRBC 0.0 0.0 - 0.2 %   Neutrophils Relative % 78 %   Neutro Abs 10.8 (H) 1.7 - 7.7 K/uL   Lymphocytes Relative 11 %   Lymphs Abs 1.5 0.7 - 4.0 K/uL   Monocytes Relative 9 %   Monocytes Absolute 1.2 (H) 0.1 - 1.0 K/uL   Eosinophils Relative 2 %   Eosinophils Absolute 0.2 0.0 - 0.5 K/uL   Basophils Relative 0 %   Basophils Absolute 0.1 0.0 - 0.1 K/uL   Immature Granulocytes 0 %   Abs Immature Granulocytes 0.05 0.00 - 0.07 K/uL  Comment: Performed at [facility], [address] [place], [place] [address]  Comprehensive metabolic panel     Status: Abnormal   Collection Time: 12/08/18 10:46 AM  Result Value Ref Range   Sodium 141 135 - 145 mmol/L   Potassium 3.8 3.5 - 5.1 mmol/L   Chloride 108 98 - 111 mmol/L   CO2 18 (L) 22 - 32 mmol/L   Glucose, Bld 129 (H) 70 - 99 mg/dL   BUN 28 (H) 8 - 23 mg/dL   Creatinine, Ser 8.76 (H) 0.61 - 1.24 mg/dL   Calcium 8.6 (L) 8.9 - 10.3 mg/dL   Total Protein 7.0 6.5 - 8.1 g/dL   Albumin 4.1 3.5 - 5.0 g/dL   AST 24 15 - 41 U/L   ALT 18 0 - 44 U/L   Alkaline Phosphatase 53 38 - 126 U/L   Total Bilirubin 1.1 0.3 - 1.2 mg/dL   GFR calc non Af Amer 43 (L) >60 mL/min   GFR calc Af Amer 50 (L) >60 mL/min   Anion gap 15 5 - 15    Comment: Performed at [facility], [address] [place], [place] [address]  Troponin I - ONCE  - STAT     Status: None   Collection Time: 12/08/18 10:46 AM  Result Value Ref Range   Troponin I <0.03 <0.03 ng/mL    Comment: Performed at [facility], [address] [place], [place] [address]  APTT     Status: None   Collection Time: 12/08/18 10:46 AM  Result Value Ref Range   aPTT 33 24 - 36 seconds    Comment: Performed at [facility], [address] [place], [place] [address]  Protime-INR     Status: None   Collection Time: 12/08/18 10:46 AM  Result Value Ref Range   Prothrombin Time 14.0 11.4 - 15.2 seconds   INR 1.1 0.8 - 1.2    Comment: (NOTE) INR goal varies based on device and disease states. Performed at [facility], [address] [place], [place] [address]   SARS Coronavirus 2 (CEPHEID - Performed in [facility] hospital lab), Hosp Order     Status: None   Collection Time: 12/08/18 12:14 PM  Result Value Ref Range   SARS Coronavirus 2 NEGATIVE NEGATIVE    Comment: (NOTE) If result is NEGATIVE SARS-CoV-2 target nucleic acids are NOT DETECTED. The SARS-CoV-2 RNA is generally detectable in upper and lower  respiratory specimens during the acute phase of infection. The lowest  concentration of SARS-CoV-2 viral copies this assay can detect is 250  copies / mL. A negative result does not preclude SARS-CoV-2 infection  and should not be used as the sole basis for treatment or other  patient management decisions.  A negative result may occur with  improper specimen collection / handling, submission of specimen other  than nasopharyngeal swab, presence of viral mutation(s) within the  areas targeted by this assay, and inadequate number of viral copies  (<250 copies / mL). A negative result must be combined with clinical  observations, patient history, and epidemiological information. If result is POSITIVE SARS-CoV-2 target nucleic acids are DETECTED. The SARS-CoV-2 RNA is generally detectable in upper and lower  respiratory specimens  dur ing the acute phase of infection.  Positive  results are indicative of active infection with SARS-CoV-2.  Clinical  correlation with patient history and other diagnostic information is  necessary to determine patient infection status.  Positive results do  not rule out bacterial infection or co-infection with other viruses. If result is PRESUMPTIVE POSTIVE SARS-CoV-2 nucleic acids MAY BE PRESENT.   A presumptive positive result was obtained on the submitted specimen  and confirmed on repeat testing.  While 2019 novel coronavirus  (SARS-CoV-2) nucleic acids may be present in the submitted sample  additional confirmatory testing may be necessary for epidemiological  and / or clinical management purposes  to differentiate between  SARS-CoV-2 and other Sarbecovirus currently known to infect humans.  If clinically indicated additional testing with an alternate test  methodology [phone]) is advised. The SARS-CoV-2 RNA is generally  detectable in upper and lower respiratory sp ecimens during the acute  phase of infection. The expected result is Negative. Fact Sheet for Patients:  BoilerBrush.com.cy Fact Sheet for Healthcare Providers: https://pope.com/ This test is not yet approved or cleared by the [place] FDA and has been authorized for detection and/or diagnosis of SARS-CoV-2 by FDA under an Emergency Use Authorization (EUA).  This EUA will remain in effect (meaning this test can be used) for the duration of the COVID-19 declaration under Section 564(b)(1) of the Act, 21 U.S.C. section 360bbb-3(b)(1), unless the authorization is terminated or revoked sooner. Performed at [facility], [address] [place], [place] [address]    Dg Chest Port 1 View  Result Date: 12/08/2018 CLINICAL DATA:  Shortness of breath EXAM: PORTABLE CHEST 1 VIEW COMPARISON:  None. FINDINGS: There are small pleural effusions bilaterally with  bibasilar atelectasis. There is no edema or consolidation. Heart is upper normal in size with pulmonary vascularity normal. No adenopathy. There old healed rib fractures on the right. IMPRESSION: Small pleural effusions with bibasilar atelectasis. No frank edema or consolidation. Heart upper normal in size. Electronically Signed   By: Bretta Bang III M.D.   On: 12/08/2018 11:17    Pending Labs Unresulted Labs (From admission, onward)    Start     Ordered   12/09/18 0500  TSH  Tomorrow morning,   STAT     12/08/18 1343   12/09/18 0500  Basic metabolic panel  Tomorrow morning,   STAT     12/08/18 1343   12/09/18 0500  CBC  Tomorrow morning,   STAT     12/08/18 1343   12/09/18 0500  Magnesium  Tomorrow morning,   STAT     12/08/18 1343   12/08/18 1643  Troponin I - Once  Once,   STAT     12/08/18 1343   12/08/18 1449  Urinalysis, Complete w Microscopic  Once,   STAT     12/08/18 1448   12/08/18 1449  Urine Drug Screen, Qualitative ([facility] only)  Once,   STAT     12/08/18 1448   12/08/18 1343  Troponin I - Once  Once,   STAT     12/08/18 1343          Vitals/Pain Today's Vitals   12/08/18 1445 12/08/18 1445 12/08/18 1500 12/08/18 1528  BP: 140/76 140/76 (!) 133/103 (!) 133/103  Pulse: 67 (!) 125  (!) 118  Resp: (!) 23 18 (!) 27 (!) 23  TempSrc:      SpO2: 100% 96%  96%  Weight:      PainSc:  0-No pain  0-No pain    Isolation Precautions No active isolations  Medications Medications  diltiazem (CARDIZEM) 100 mg in dextrose 5 % 100 mL (1 mg/mL) infusion (10 mg/hr Intravenous New Bag/Given 12/08/18 1059)  simvastatin (ZOCOR) tablet 20 mg (  has no administration in time range)  ferrous gluconate (FERGON) tablet 324 mg (has no administration in time range)  acetaminophen (TYLENOL) tablet 650 mg (has no administration in time range)  ondansetron (ZOFRAN) injection 4 mg (has no administration in time range)  enoxaparin (LOVENOX) injection 95 mg (has no administration in time  range)  diltiazem (CARDIZEM) injection 15 mg (15 mg Intravenous Given 12/08/18 1101)    Mobility walks Low fall risk   Focused Assessments Cardiac Assessment Handoff:  Cardiac Rhythm: Atrial fibrillation Lab Results  Component Value Date   TROPONINI <0.03 12/08/2018   No results found for: DDIMER Does the Patient currently have chest pain? No     R Recommendations: See Admitting Provider Note  Report given to:   Additional Notes:

## 2018-12-09 ENCOUNTER — Inpatient Hospital Stay: Payer: Medicare Other

## 2018-12-09 LAB — BASIC METABOLIC PANEL
Anion gap: 10 (ref 5–15)
BUN: 31 mg/dL — ABNORMAL HIGH (ref 8–23)
CO2: 23 mmol/L (ref 22–32)
Calcium: 8.6 mg/dL — ABNORMAL LOW (ref 8.9–10.3)
Chloride: 110 mmol/L (ref 98–111)
Creatinine, Ser: 1.19 mg/dL (ref 0.61–1.24)
GFR calc Af Amer: >60 mL/min (ref >60)
GFR calc non Af Amer: 56 mL/min — ABNORMAL LOW (ref >60)
Glucose, Bld: 90 mg/dL (ref 70–99)
Potassium: 3.7 mmol/L (ref 3.5–5.1)
Sodium: 143 mmol/L (ref 135–145)

## 2018-12-09 LAB — CBC
HCT: 41.4 % (ref 39.0–52.0)
Hemoglobin: 13.5 g/dL (ref 13.0–17.0)
MCH: 31.3 pg (ref 26.0–34.0)
MCHC: 32.6 g/dL (ref 30.0–36.0)
MCV: 96.1 fL (ref 80.0–100.0)
Platelets: 217 10*3/uL (ref 150–400)
RBC: 4.31 MIL/uL (ref 4.22–5.81)
RDW: 12.5 % (ref 11.5–15.5)
WBC: 9 10*3/uL (ref 4.0–10.5)
nRBC: 0 % (ref 0.0–0.2)

## 2018-12-09 LAB — MAGNESIUM: Magnesium: 2.2 mg/dL (ref 1.7–2.4)

## 2018-12-09 LAB — BRAIN NATRIURETIC PEPTIDE: B Natriuretic Peptide: 257 pg/mL — ABNORMAL HIGH (ref 0.0–100.0)

## 2018-12-09 LAB — TSH: TSH: 1.608 u[IU]/mL (ref 0.350–4.500)

## 2018-12-09 MED ORDER — IOHEXOL 350 MG/ML SOLN
75.0000 mL | Freq: Once | INTRAVENOUS | Status: AC | PRN
  Administered 2018-12-09: 75 mL via INTRAVENOUS

## 2018-12-09 MED ORDER — LEVALBUTEROL HCL 1.25 MG/0.5ML IN NEBU
1.2500 mg | INHALATION_SOLUTION | Freq: Four times a day (QID) | RESPIRATORY_TRACT | Status: DC
  Administered 2018-12-09 – 2018-12-10 (×4): 1.25 mg via RESPIRATORY_TRACT
  Filled 2018-12-09 (×6): qty 0.5

## 2018-12-09 MED ORDER — ATENOLOL 25 MG PO TABS
25.0000 mg | ORAL_TABLET | Freq: Every day | ORAL | Status: DC
  Administered 2018-12-09: 25 mg via ORAL
  Filled 2018-12-09 (×2): qty 1

## 2018-12-09 MED ORDER — BUDESONIDE 0.25 MG/2ML IN SUSP
0.2500 mg | Freq: Two times a day (BID) | RESPIRATORY_TRACT | Status: DC
  Administered 2018-12-09 – 2018-12-12 (×6): 0.25 mg via RESPIRATORY_TRACT
  Filled 2018-12-09 (×6): qty 2

## 2018-12-09 MED ORDER — FUROSEMIDE 10 MG/ML IJ SOLN
40.0000 mg | Freq: Two times a day (BID) | INTRAMUSCULAR | Status: DC
  Administered 2018-12-09 – 2018-12-12 (×7): 40 mg via INTRAVENOUS
  Filled 2018-12-09 (×7): qty 4

## 2018-12-09 MED ORDER — ENOXAPARIN SODIUM 80 MG/0.8ML ~~LOC~~ SOLN: 1.0000 mg/kg | Freq: Two times a day (BID) | SUBCUTANEOUS | Status: DC

## 2018-12-09 MED ORDER — GUAIFENESIN ER 600 MG PO TB12
600.0000 mg | ORAL_TABLET | Freq: Two times a day (BID) | ORAL | Status: DC
  Administered 2018-12-09 – 2018-12-12 (×7): 600 mg via ORAL
  Filled 2018-12-09 (×7): qty 1

## 2018-12-09 MED ORDER — METHYLPREDNISOLONE SODIUM SUCC 125 MG IJ SOLR
60.0000 mg | Freq: Four times a day (QID) | INTRAMUSCULAR | Status: DC
  Administered 2018-12-09 – 2018-12-12 (×13): 60 mg via INTRAVENOUS
  Filled 2018-12-09 (×13): qty 2

## 2018-12-09 MED ORDER — GUAIFENESIN-DM 100-10 MG/5ML PO SYRP: 15.0000 mL | ORAL_SOLUTION | ORAL | Status: DC | PRN

## 2018-12-09 MED ORDER — APIXABAN 5 MG PO TABS
5.0000 mg | ORAL_TABLET | Freq: Two times a day (BID) | ORAL | Status: DC
  Administered 2018-12-09 – 2018-12-12 (×6): 5 mg via ORAL
  Filled 2018-12-09 (×6): qty 1

## 2018-12-09 MED ORDER — DILTIAZEM HCL 30 MG PO TABS
60.0000 mg | ORAL_TABLET | Freq: Four times a day (QID) | ORAL | Status: DC
  Administered 2018-12-09 (×3): 60 mg via ORAL
  Filled 2018-12-09 (×3): qty 2

## 2018-12-09 NOTE — Progress Notes (Signed)
Back from CT

## 2018-12-09 NOTE — Progress Notes (Signed)
[name] daughter-in-law called and she was updated on the plan of care. She has no questions at this time but did want to make [place] aware that the patient drinks approximately 12 pack/day of beer.

## 2018-12-09 NOTE — Plan of Care (Signed)
Pt continues on cardizem gtt @ 12.5mg /hr, still afib, but rate in the 90's. Pt did not get any sleep, but states he never sleeps, even got pt an Ambien PRN to help with sleep, with no effect. On 2L O2 acute, still pretty dyspneic when moving at all.   Problem: Nutrition: Goal: Adequate nutrition will be maintained Outcome: Progressing   Problem: Coping: Goal: Level of anxiety will decrease Outcome: Progressing   Problem: Pain Managment: Goal: General experience of comfort will improve Outcome: Progressing Note:  Complained of a headache once, treated with tylenol which gave relief   Problem: Safety: Goal: Ability to remain free from injury will improve Outcome: Progressing   Problem: Skin Integrity: Goal: Risk for impaired skin integrity will decrease Outcome: Progressing   Problem: Cardiac: Goal: Ability to achieve and maintain adequate cardiopulmonary perfusion will improve Outcome: Progressing

## 2018-12-09 NOTE — Consult Note (Signed)
[facility] Cardiology  CARDIOLOGY CONSULT NOTE  Patient ID: Caleb Fleming. MRN: [id number] DOB/AGE: [birth year] 85 y.o.  Admit date: 12/08/2018 Referring Physician Enid Baas Primary Physician [facility] Primary Cardiologist None per patient Reason for Consultation New onset atrial fibrillation with RVR  HPI: 85 year old male referred for new onset atrial fibrillation with RVR.  The patient has a history of hyperlipidemia, hypertension, and alcohol abuse, reportedly drinks a 12 pack of beer per day.  The patient reports experiencing intermittent mild left sided chest discomfort for about a year, which he has not given much thought to. Over the last 2-3 days, he experienced persistent left sided chest discomfort with shortness of breath.  The patient presented to [facility] ER for further evaluation. ECG revealed atrial fibrillation with RVR at a rate of 143 bpm without acute ST or T wave abnormalities. The patient was given IV Cardizem bolus and started on Cardizem drip.  Chest x-ray revealed small pleural effusion with basilar atelectasis. CT angio chest revealed no pulmonary embolism with moderate dependent bilateral pleural effusions with moderate atelectasis in the lower lobes, borderline mild cardiomegaly, and pulmonary edema. Admission labs notable for BNP 257, TSH within normal range, troponin less than 0.033, negative COVID. Currently, the patient reports feeling fine. He denies chest pain or shrotness of breath on supplemental oxygen, and denies palpitations. He denies a known cardiac history, stroke, heart failure, diabetes, CKD, or blood clot. He has a chads vasc score of 2 (age, HTN).   Review of systems complete and found to be negative unless listed above     Past Medical History:  Diagnosis Date  . Anxiety   . BPH (benign prostatic hypertrophy)   . Depression   . ED (erectile dysfunction)   . Gout   . Hyperlipidemia   . Hypertension   . Insomnia     History reviewed. No pertinent surgical  history.  Medications Prior to Admission  Medication Sig Dispense Refill Last Dose  . acetaminophen (TYLENOL) 325 MG tablet Take 650 mg by mouth every 4 (four) hours as needed.   prn at prn  . amLODipine (NORVASC) 5 MG tablet TAKE ONE TABLET BY MOUTH ONCE DAILY 5 tablet 0 Past Month at Unknown time  . aspirin 81 MG tablet Take 81 mg by mouth daily.   12/08/2018 at 0800  . atenolol (TENORMIN) 25 MG tablet TAKE 1 TABLET BY MOUTH DAILY 90 tablet 1 Past Month at Unknown time  . Cholecalciferol (VITAMIN D3) 25 MCG (1000 UT) CAPS Take 1,000 Units by mouth daily.   12/08/2018 at 0800  . citalopram (CELEXA) 20 MG tablet 1 TABLET AT BED-TIME ORAL 90 tablet 1 Past Month at Unknown time  . ferrous gluconate (FERGON) 324 MG tablet Take 324 mg by mouth daily with breakfast.   12/08/2018 at 0800  . Misc Natural Products (TUMERSAID) TABS Take 1 tablet by mouth daily.   12/08/2018 at 0800  . Omega-3 1000 MG CAPS Take 2 g by mouth daily.   12/08/2018 at 0800  . simvastatin (ZOCOR) 20 MG tablet 1 TABLET ONCE EACH DAY ORAL 90 tablet 1 Past Month at Unknown time  . vitamin C (ASCORBIC ACID) 500 MG tablet Take 500 mg by mouth daily.   12/08/2018 at 0800   Social History   Socioeconomic History  . Marital status: Married    Spouse name: Not on file  . Number of children: Not on file  . Years of education: Not on file  . Highest education level: Not on  file  Occupational History  . Not on file  Social Needs  . Financial resource strain: Not on file  . Food insecurity:    Worry: Not on file    Inability: Not on file  . Transportation needs:    Medical: Not on file    Non-medical: Not on file  Tobacco Use  . Smoking status: Former Games developermoker  . Smokeless tobacco: Never Used  Substance and Sexual Activity  . Alcohol use: Not Currently    Alcohol/week: 0.0 standard drinks  . Drug use: Never  . Sexual activity: Not on file  Lifestyle  . Physical activity:    Days per week: Not on file    Minutes per session: Not on  file  . Stress: Not on file  Relationships  . Social connections:    Talks on phone: Not on file    Gets together: Not on file    Attends religious service: Not on file    Active member of club or organization: Not on file    Attends meetings of clubs or organizations: Not on file    Relationship status: Not on file  . Intimate partner violence:    Fear of current or ex partner: Not on file    Emotionally abused: Not on file    Physically abused: Not on file    Forced sexual activity: Not on file  Other Topics Concern  . Not on file  Social History Narrative  . Not on file    Family History  Problem Relation Age of Onset  . Cancer Mother   . Cancer Father       Review of systems complete and found to be negative unless listed above      PHYSICAL EXAM  General: Well developed, well nourished, in no acute distress HEENT:  Normocephalic and atramatic Neck:  No JVD.  Lungs: Clear bilaterally to auscultation and percussion. Heart: HRRR . Normal S1 and S2 without gallops or murmurs.  Abdomen: Bowel sounds are positive, abdomen soft and non-tender  Msk:  Back normal, normal gait. Normal strength and tone for age. Extremities: No clubbing, cyanosis or edema.   Neuro: Alert and oriented X 3. Psych:  Good affect, responds appropriately  Labs:   Lab Results  Component Value Date   WBC 9.0 12/09/2018   HGB 13.5 12/09/2018   HCT 41.4 12/09/2018   MCV 96.1 12/09/2018   PLT 217 12/09/2018    Recent Labs  Lab 12/08/18 1046 12/09/18 0321  NA 141 143  K 3.8 3.7  CL 108 110  CO2 18* 23  BUN 28* 31*  CREATININE 1.48* 1.19  CALCIUM 8.6* 8.6*  PROT 7.0  --   BILITOT 1.1  --   ALKPHOS 53  --   ALT 18  --   AST 24  --   GLUCOSE 129* 90   Lab Results  Component Value Date   TROPONINI <0.03 12/08/2018   No results found for: CHOL No results found for: HDL No results found for: LDLCALC No results found for: TRIG No results found for: CHOLHDL No results found for:  LDLDIRECT    Radiology: Ct Angio Chest Pe W Or Wo Contrast  Result Date: 12/09/2018 CLINICAL DATA:  Inpatient. Dyspnea and chest pain. Atrial fibrillation. EXAM: CT ANGIOGRAPHY CHEST WITH CONTRAST TECHNIQUE: Multidetector CT imaging of the chest was performed using the standard protocol during bolus administration of intravenous contrast. Multiplanar CT image reconstructions and MIPs were obtained to evaluate the vascular anatomy.  CONTRAST:  75mL OMNIPAQUE IOHEXOL 350 MG/ML SOLN COMPARISON:  Chest radiograph from one day prior. FINDINGS: Cardiovascular: The study is moderate quality for the evaluation of pulmonary embolism, with some motion degradation. There are no filling defects in the central, lobar, segmental or subsegmental pulmonary artery branches to suggest acute pulmonary embolism. Atherosclerotic nonaneurysmal thoracic aorta. Normal caliber pulmonary arteries. Borderline mild cardiomegaly. No significant pericardial fluid/thickening. Mediastinum/Nodes: No discrete thyroid nodules. Unremarkable esophagus. No pathologically enlarged axillary, mediastinal or hilar lymph nodes. Lungs/Pleura: No pneumothorax. Moderate dependent bilateral pleural effusions with moderate passive atelectasis in the dependent lower lobes bilaterally. No central airway stenoses. No lung masses or significant pulmonary nodules in the aerated portions of the lungs. Mild patchy ground-glass attenuation and minimal interlobular septal thickening in both lungs. Upper abdomen: Small hiatal hernia. Partially visualize simple 1.3 cm medial upper right renal cyst. Musculoskeletal: No aggressive appearing focal osseous lesions. Moderate thoracic spondylosis. Review of the MIP images confirms the above findings. IMPRESSION: 1. No pulmonary embolism. 2. Moderate dependent bilateral pleural effusions with moderate passive atelectasis in the lower lobes. 3. Borderline mild cardiomegaly. Mild patchy ground-glass attenuation and minimal  interlobular septal thickening in the lungs, suggesting mild cardiogenic pulmonary edema. 4. Small hiatal hernia. Aortic Atherosclerosis (ICD10-I70.0). Electronically Signed   By: Delbert Phenix M.D.   On: 12/09/2018 10:21   Dg Chest Port 1 View  Result Date: 12/08/2018 CLINICAL DATA:  Shortness of breath EXAM: PORTABLE CHEST 1 VIEW COMPARISON:  None. FINDINGS: There are small pleural effusions bilaterally with bibasilar atelectasis. There is no edema or consolidation. Heart is upper normal in size with pulmonary vascularity normal. No adenopathy. There old healed rib fractures on the right. IMPRESSION: Small pleural effusions with bibasilar atelectasis. No frank edema or consolidation. Heart upper normal in size. Electronically Signed   By: Bretta Bang III M.D.   On: 12/08/2018 11:17    EKG: Atrial fibrillation, rate 96-103 bpm  ASSESSMENT AND PLAN:  1. New onset atrial fibrillation with RVR, started on Cardizem drip, rates improved to 90-low100s bpm at rest. Chads vasc score of 2 (age, HTN).  2. Acute CHF, unknown type, BNP elevated to 257, bilateral moderate pleural effusions. Receiving IV Lasix. Troponin less than 0.03 x 3.  3. Hypertension, typically on atenolol and amlodipine at home. Diastolic BP mildly elevated, 129/95 4. Alcohol abuse, reportedly drinks 12 pack/day  Recommendations: 1. Start Eliquis 5 mg BID for stroke prevention; discontinue Lovenox 2. 2D echocardiogram 3. Continue IV Lasix 40 mg BID for now with careful monitoring of renal status 4. Discontinue cardizem drip 5. Start Cardizem 60 mg q 6 hrs 6. Resume atenolol 25 mg once daily 7. Strongly encouraged patient to limit alcohol intake  Signed: Leanora Ivanoff PA-C 12/09/2018, 11:27 AM

## 2018-12-09 NOTE — Progress Notes (Signed)
[employer] - [place] [facility]                                                                                                                                                                                  Patient Demographics   Caleb Fleming, is a 85 y.o. male, DOB - [birth year], ZOX:[id number]  Admit date - 12/08/2018   Admitting Physician Jude Enid Baas, MD  Outpatient Primary MD for the patient is Steele Sizer, MD   LOS - 1  Subjective: Patient was very dyspneic this morning.    Review of Systems:   CONSTITUTIONAL: No documented fever. No fatigue, weakness. No weight gain, no weight loss.  EYES: No blurry or double vision.  ENT: No tinnitus. No postnasal drip. No redness of the oropharynx.  RESPIRATORY: Positive cough, no wheeze, no hemoptysis.  Positive dyspnea.  CARDIOVASCULAR: No chest pain. No orthopnea. No palpitations. No syncope.  GASTROINTESTINAL: No nausea, no vomiting or diarrhea. No abdominal pain. No melena or hematochezia.  GENITOURINARY: No dysuria or hematuria.  ENDOCRINE: No polyuria or nocturia. No heat or cold intolerance.  HEMATOLOGY: No anemia. No bruising. No bleeding.  INTEGUMENTARY: No rashes. No lesions.  MUSCULOSKELETAL: No arthritis. No swelling. No gout.  NEUROLOGIC: No numbness, tingling, or ataxia. No seizure-type activity.  PSYCHIATRIC: No anxiety. No insomnia. No ADD.    Vitals:   Vitals:   12/09/18 0011 12/09/18 0457 12/09/18 0727 12/09/18 1338  BP: 117/84 125/88 (!) 129/95   Pulse: 96 (!) 57 92   Resp: (!) 26  20   Temp:  (!) 97.5 F (36.4 C) 97.8 F (36.6 C)   TempSrc:  Oral Oral   SpO2: 96% 97% 98% 97%  Weight:      Height:        Wt Readings from Last 3 Encounters:  12/08/18 69.9 kg  12/01/14 75.3 kg     Intake/Output Summary (Last 24 hours) at 12/09/2018 1442 Last data filed at 12/09/2018 1300 Gross per 24 hour  Intake 272.8 ml  Output 1425 ml  Net -1152.2 ml    Physical Exam:   GENERAL:  Pleasant-appearing in no apparent distress.  HEAD, EYES, EARS, NOSE AND THROAT: Atraumatic, normocephalic. Extraocular muscles are intact. Pupils equal and reactive to light. Sclerae anicteric. No conjunctival injection. No oro-pharyngeal erythema.  NECK: Supple. There is no jugular venous distention. No bruits, no lymphadenopathy, no thyromegaly.  HEART: Regular rate and rhythm,. No murmurs, no rubs, no clicks.  LUNGS: Diminished sounds at the bases as well as crackles ABDOMEN: Soft, flat, nontender, nondistended. Has good bowel sounds. No hepatosplenomegaly appreciated.  EXTREMITIES: No evidence of any cyanosis, clubbing, or peripheral edema.  +  2 pedal and radial pulses bilaterally.  NEUROLOGIC: The patient is alert, awake, and oriented x3 with no focal motor or sensory deficits appreciated bilaterally.  SKIN: Moist and warm with no rashes appreciated.  Psych: Not anxious, depressed LN: No inguinal LN enlargement    Antibiotics   Anti-infectives (From admission, onward)   None      Medications   Scheduled Meds: . apixaban  5 mg Oral BID  . atenolol  25 mg Oral Daily  . budesonide (PULMICORT) nebulizer solution  0.25 mg Nebulization BID  . diltiazem  60 mg Oral Q6H  . ferrous gluconate  324 mg Oral Q breakfast  . furosemide  40 mg Intravenous Q12H  . guaiFENesin  600 mg Oral BID  . levalbuterol  1.25 mg Nebulization Q6H  . methylPREDNISolone (SOLU-MEDROL) injection  60 mg Intravenous Q6H  . simvastatin  20 mg Oral q1800   Continuous Infusions: PRN Meds:.acetaminophen, guaiFENesin-dextromethorphan, ondansetron (ZOFRAN) IV, zolpidem   Data Review:   Micro Results Recent Results (from the past 240 hour(s))  SARS Coronavirus 2 (CEPHEID - [facility] Hosp Order     Status: None   Collection Time: 12/08/18 12:14 PM  Result Value Ref Range Status   SARS Coronavirus 2 NEGATIVE NEGATIVE Final    Comment: (NOTE) If result is NEGATIVE SARS-CoV-2  target nucleic acids are NOT DETECTED. The SARS-CoV-2 RNA is generally detectable in upper and lower  respiratory specimens during the acute phase of infection. The lowest  concentration of SARS-CoV-2 viral copies this assay can detect is 250  copies / mL. A negative result does not preclude SARS-CoV-2 infection  and should not be used as the sole basis for treatment or other  patient management decisions.  A negative result may occur with  improper specimen collection / handling, submission of specimen other  than nasopharyngeal swab, presence of viral mutation(s) within the  areas targeted by this assay, and inadequate number of viral copies  (<250 copies / mL). A negative result must be combined with clinical  observations, patient history, and epidemiological information. If result is POSITIVE SARS-CoV-2 target nucleic acids are DETECTED. The SARS-CoV-2 RNA is generally detectable in upper and lower  respiratory specimens dur ing the acute phase of infection.  Positive  results are indicative of active infection with SARS-CoV-2.  Clinical  correlation with patient history and other diagnostic information is  necessary to determine patient infection status.  Positive results do  not rule out bacterial infection or co-infection with other viruses. If result is PRESUMPTIVE POSTIVE SARS-CoV-2 nucleic acids MAY BE PRESENT.   A presumptive positive result was obtained on the submitted specimen  and confirmed on repeat testing.  While 2019 novel coronavirus  (SARS-CoV-2) nucleic acids may be present in the submitted sample  additional confirmatory testing may be necessary for epidemiological  and / or clinical management purposes  to differentiate between  SARS-CoV-2 and other Sarbecovirus currently known to infect humans.  If clinically indicated additional testing with an alternate test  methodology [phone]([id number]) is advised. The SARS-CoV-2 RNA is generally  detectable in upper and lower  respiratory sp ecimens during the acute  phase of infection. The expected result is Negative. Fact Sheet for Patients:  BoilerBrush.com.cyhttps://www.fda.gov/media/136312/download Fact Sheet for Healthcare Providers: https://pope.com/https://www.fda.gov/media/136313/download This test is not yet approved or cleared by the [place] FDA and has been authorized for detection and/or diagnosis of SARS-CoV-2 by FDA under an Emergency Use Authorization (EUA).  This EUA will remain in  effect (meaning this test can be used) for the duration of the COVID-19 declaration under Section 564(b)(1) of the Act, 21 U.S.C. section 360bbb-3(b)(1), unless the authorization is terminated or revoked sooner. Performed at [facility], [address], [place], [place] [address]     Radiology Reports Ct Angio Chest Pe W Or Wo Contrast  Result Date: 12/09/2018 CLINICAL DATA:  Inpatient. Dyspnea and chest pain. Atrial fibrillation. EXAM: CT ANGIOGRAPHY CHEST WITH CONTRAST TECHNIQUE: Multidetector CT imaging of the chest was performed using the standard protocol during bolus administration of intravenous contrast. Multiplanar CT image reconstructions and MIPs were obtained to evaluate the vascular anatomy. CONTRAST:  75mL OMNIPAQUE IOHEXOL 350 MG/ML SOLN COMPARISON:  Chest radiograph from one day prior. FINDINGS: Cardiovascular: The study is moderate quality for the evaluation of pulmonary embolism, with some motion degradation. There are no filling defects in the central, lobar, segmental or subsegmental pulmonary artery branches to suggest acute pulmonary embolism. Atherosclerotic nonaneurysmal thoracic aorta. Normal caliber pulmonary arteries. Borderline mild cardiomegaly. No significant pericardial fluid/thickening. Mediastinum/Nodes: No discrete thyroid nodules. Unremarkable esophagus. No pathologically enlarged axillary, mediastinal or hilar lymph nodes. Lungs/Pleura: No pneumothorax. Moderate dependent bilateral pleural effusions with  moderate passive atelectasis in the dependent lower lobes bilaterally. No central airway stenoses. No lung masses or significant pulmonary nodules in the aerated portions of the lungs. Mild patchy ground-glass attenuation and minimal interlobular septal thickening in both lungs. Upper abdomen: Small hiatal hernia. Partially visualize simple 1.3 cm medial upper right renal cyst. Musculoskeletal: No aggressive appearing focal osseous lesions. Moderate thoracic spondylosis. Review of the MIP images confirms the above findings. IMPRESSION: 1. No pulmonary embolism. 2. Moderate dependent bilateral pleural effusions with moderate passive atelectasis in the lower lobes. 3. Borderline mild cardiomegaly. Mild patchy ground-glass attenuation and minimal interlobular septal thickening in the lungs, suggesting mild cardiogenic pulmonary edema. 4. Small hiatal hernia. Aortic Atherosclerosis (ICD10-I70.0). Electronically Signed   By: Delbert Phenix M.D.   On: 12/09/2018 10:21   Dg Chest Port 1 View  Result Date: 12/08/2018 CLINICAL DATA:  Shortness of breath EXAM: PORTABLE CHEST 1 VIEW COMPARISON:  None. FINDINGS: There are small pleural effusions bilaterally with bibasilar atelectasis. There is no edema or consolidation. Heart is upper normal in size with pulmonary vascularity normal. No adenopathy. There old healed rib fractures on the right. IMPRESSION: Small pleural effusions with bibasilar atelectasis. No frank edema or consolidation. Heart upper normal in size. Electronically Signed   By: Bretta Bang III M.D.   On: 12/08/2018 11:17     CBC Recent Labs  Lab 12/08/18 1046 12/09/18 0321  WBC 13.9* 9.0  HGB 15.4 13.5  HCT 46.7 41.4  PLT 253 217  MCV 95.7 96.1  MCH 31.6 31.3  MCHC 33.0 32.6  RDW 12.7 12.5  LYMPHSABS 1.5  --   MONOABS 1.2*  --   EOSABS 0.2  --   BASOSABS 0.1  --     Chemistries  Recent Labs  Lab 12/08/18 1046 12/09/18 0321  NA 141 143  K 3.8 3.7  CL 108 110  CO2 18* 23   GLUCOSE 129* 90  BUN 28* 31*  CREATININE 1.48* 1.19  CALCIUM 8.6* 8.6*  MG  --  2.2  AST 24  --   ALT 18  --   ALKPHOS 53  --   BILITOT 1.1  --    ------------------------------------------------------------------------------------------------------------------ estimated creatinine clearance is 41.7 mL/min (by C-G formula based on SCr of 1.19 mg/dL). ------------------------------------------------------------------------------------------------------------------ No results for input(s): HGBA1C  in the last 72 hours. ------------------------------------------------------------------------------------------------------------------ No results for input(s): CHOL, HDL, LDLCALC, TRIG, CHOLHDL, LDLDIRECT in the last 72 hours. ------------------------------------------------------------------------------------------------------------------ Recent Labs    12/09/18 0321  TSH 1.608   ------------------------------------------------------------------------------------------------------------------ No results for input(s): VITAMINB12, FOLATE, FERRITIN, TIBC, IRON, RETICCTPCT in the last 72 hours.  Coagulation profile Recent Labs  Lab 12/08/18 1046  INR 1.1    No results for input(s): DDIMER in the last 72 hours.  Cardiac Enzymes Recent Labs  Lab 12/08/18 1046 12/08/18 1447 12/08/18 1639  TROPONINI <0.03 <0.03 <0.03   ------------------------------------------------------------------------------------------------------------------ Invalid input(s): POCBNP    Assessment & Plan   85 y.o. male with a known history of hyperlipidemia, depression and anxiety, hypertension and insomnia presenting to the ED with chief complaints of chest pain, shortness of breath and facial pain.  1. New onset Atrial Fibrillation with RVR  Heart rate stable Patient converted to Cardizem orally Continue Eliquis twice daily    2.  Acute respiratory failure CT scan was ordered by me and shows  bilateral pleural effusions consistent with acute CHF We will treat with IV Lasix Also he has bronchospasm we will treat with nebs steroids    3.. AKI -BUN/Cr elevated -Follow renal function  4.. HLD + Goal LDL<100 - Simvastatin 20mg  PO qhs  5. HTN + Goal BP <130/80 - Beta-blocker: Atenolol held due to RVR - Hold Norvasc for now will reconsider restarting when hemodynamically stable  6. Thromboembolism Risk Management CHADS2 = 2 - Lovenox     Code Status Orders  (From admission, onward)         Start     Ordered   12/08/18 1320  Do not attempt resuscitation (DNR)  Continuous    Question Answer Comment  In the event of cardiac or respiratory ARREST Do not call a "code blue"   In the event of cardiac or respiratory ARREST Do not perform Intubation, CPR, defibrillation or ACLS   In the event of cardiac or respiratory ARREST Use medication by any route, position, wound care, and other measures to relive pain and suffering. May use oxygen, suction and manual treatment of airway obstruction as needed for comfort.      12/08/18 1323        Code Status History    This patient has a current code status but no historical code status.           Consults   Cardiology DVT Prophylaxis Eliquis Lab Results  Component Value Date   PLT 217 12/09/2018     Time Spent in minutes Greater than 50% of time spent in care coordination and counseling patient regarding the condition and plan of care.   Auburn Bilberry M.D on 12/09/2018 at 2:42 PM  Between 7am to 6pm - Pager - [phone]  After 6pm go to [email or web address] - Social research officer, government  [employer]   Office  [phone]

## 2018-12-09 NOTE — Progress Notes (Signed)
To cat scan via bed with Marchelle Folks RN

## 2018-12-09 NOTE — Plan of Care (Signed)
  Problem: Health Behavior/Discharge Planning: Goal: Ability to manage health-related needs will improve Outcome: Progressing   Problem: Clinical Measurements: Goal: Ability to maintain clinical measurements within normal limits will improve Outcome: Progressing Goal: Will remain free from infection Outcome: Progressing Note:  Remains afebrile   Problem: Clinical Measurements: Goal: Diagnostic test results will improve Outcome: Not Progressing Note:  BNP 257, chest CT shows bil pleural effusions, mild pulm edema Goal: Respiratory complications will improve Outcome: Not Progressing Note:  IV steroids, Nebs ordered Goal: Cardiovascular complication will be avoided Outcome: Not Progressing Note:  Remains in afib   Problem: Education: Goal: Understanding of medication regimen will improve Outcome: Not Progressing Note:  Remains on Cardizem gtt

## 2018-12-09 NOTE — Progress Notes (Signed)
Page out to Dr. Allena Katz regarding pt's bil wheezing and diminished lung sounds and SOB with 3L O2.

## 2018-12-10 MED ORDER — LEVALBUTEROL HCL 1.25 MG/0.5ML IN NEBU
1.2500 mg | INHALATION_SOLUTION | Freq: Two times a day (BID) | RESPIRATORY_TRACT | Status: DC
  Administered 2018-12-11 – 2018-12-12 (×3): 1.25 mg via RESPIRATORY_TRACT
  Filled 2018-12-10 (×4): qty 0.5

## 2018-12-10 MED ORDER — CITALOPRAM HYDROBROMIDE 20 MG PO TABS
20.0000 mg | ORAL_TABLET | Freq: Every day | ORAL | Status: DC
  Administered 2018-12-10 – 2018-12-12 (×3): 20 mg via ORAL
  Filled 2018-12-10 (×3): qty 1

## 2018-12-10 MED ORDER — AMIODARONE HCL 200 MG PO TABS
200.0000 mg | ORAL_TABLET | Freq: Every day | ORAL | Status: DC
  Administered 2018-12-10 – 2018-12-12 (×3): 200 mg via ORAL
  Filled 2018-12-10 (×3): qty 1

## 2018-12-10 MED ORDER — METOPROLOL SUCCINATE ER 25 MG PO TB24
12.5000 mg | ORAL_TABLET | Freq: Every day | ORAL | Status: DC
  Administered 2018-12-10: 12.5 mg via ORAL
  Filled 2018-12-10: qty 1

## 2018-12-10 NOTE — Plan of Care (Signed)
  Problem: Clinical Measurements: Goal: Will remain free from infection Outcome: Progressing   Problem: Coping: Goal: Level of anxiety will decrease Outcome: Progressing   Problem: Elimination: Goal: Will not experience complications related to urinary retention Outcome: Progressing   Problem: Pain Managment: Goal: General experience of comfort will improve Outcome: Progressing   Problem: Safety: Goal: Ability to remain free from injury will improve Outcome: Progressing   Problem: Skin Integrity: Goal: Risk for impaired skin integrity will decrease Outcome: Progressing   Problem: Activity: Goal: Ability to tolerate increased activity will improve Outcome: Progressing

## 2018-12-10 NOTE — Progress Notes (Signed)
[employer] - [place] [facility]                                                                                                                                                                                  Patient Demographics   Caleb Fleming, is a 85 y.o. male, DOB - 1932/03/08, ZOX:[id number]  Admit date - 12/08/2018   Admitting Physician Jude Enid Baasjie, MD  Outpatient Primary MD for the patient is Steele Sizerrissman, Mark A, MD   LOS - 2  Subjective: Patient still short of breath but improved compared to yesterday    Review of Systems:   CONSTITUTIONAL: No documented fever. No fatigue, weakness. No weight gain, no weight loss.  EYES: No blurry or double vision.  ENT: No tinnitus. No postnasal drip. No redness of the oropharynx.  RESPIRATORY: Positive cough, no wheeze, no hemoptysis.  Positive dyspnea.  CARDIOVASCULAR: No chest pain. No orthopnea. No palpitations. No syncope.  GASTROINTESTINAL: No nausea, no vomiting or diarrhea. No abdominal pain. No melena or hematochezia.  GENITOURINARY: No dysuria or hematuria.  ENDOCRINE: No polyuria or nocturia. No heat or cold intolerance.  HEMATOLOGY: No anemia. No bruising. No bleeding.  INTEGUMENTARY: No rashes. No lesions.  MUSCULOSKELETAL: No arthritis. No swelling. No gout.  NEUROLOGIC: No numbness, tingling, or ataxia. No seizure-type activity.  PSYCHIATRIC: No anxiety. No insomnia. No ADD.    Vitals:   Vitals:   12/09/18 2354 12/10/18 0537 12/10/18 0742 12/10/18 1000  BP:  96/76  128/87  Pulse: 79 74  (!) 114  Resp:  18    Temp:  97.7 F (36.5 C)    TempSrc:  Oral    SpO2: 94% 91% 91%   Weight:  68.9 kg    Height:        Wt Readings from Last 3 Encounters:  12/10/18 68.9 kg  12/01/14 75.3 kg     Intake/Output Summary (Last 24 hours) at 12/10/2018 1526 Last data filed at 12/10/2018 1036 Gross per 24 hour  Intake 480 ml  Output 1600 ml  Net -1120 ml    Physical Exam:   GENERAL: Pleasant-appearing in  no apparent distress.  HEAD, EYES, EARS, NOSE AND THROAT: Atraumatic, normocephalic. Extraocular muscles are intact. Pupils equal and reactive to light. Sclerae anicteric. No conjunctival injection. No oro-pharyngeal erythema.  NECK: Supple. There is no jugular venous distention. No bruits, no lymphadenopathy, no thyromegaly.  HEART: Regular rate and rhythm,. No murmurs, no rubs, no clicks.  LUNGS: Diminished sounds at the bases as well as crackles ABDOMEN: Soft, flat, nontender, nondistended. Has good bowel sounds. No hepatosplenomegaly appreciated.  EXTREMITIES: No evidence of any cyanosis, clubbing, or peripheral edema.  +2  pedal and radial pulses bilaterally.  NEUROLOGIC: The patient is alert, awake, and oriented x3 with no focal motor or sensory deficits appreciated bilaterally.  SKIN: Moist and warm with no rashes appreciated.  Psych: Not anxious, depressed LN: No inguinal LN enlargement    Antibiotics   Anti-infectives (From admission, onward)   None      Medications   Scheduled Meds: . amiodarone  200 mg Oral Daily  . apixaban  5 mg Oral BID  . budesonide (PULMICORT) nebulizer solution  0.25 mg Nebulization BID  . citalopram  20 mg Oral Daily  . ferrous gluconate  324 mg Oral Q breakfast  . furosemide  40 mg Intravenous Q12H  . guaiFENesin  600 mg Oral BID  . [START ON 12/11/2018] levalbuterol  1.25 mg Nebulization BID  . methylPREDNISolone (SOLU-MEDROL) injection  60 mg Intravenous Q6H  . metoprolol succinate  12.5 mg Oral Daily  . simvastatin  20 mg Oral q1800   Continuous Infusions: PRN Meds:.acetaminophen, guaiFENesin-dextromethorphan, ondansetron (ZOFRAN) IV, zolpidem   Data Review:   Micro Results Recent Results (from the past 240 hour(s))  SARS Coronavirus 2 (CEPHEID - Performed in [facility] hospital lab), Hosp Order     Status: None   Collection Time: 12/08/18 12:14 PM  Result Value Ref Range Status   SARS Coronavirus 2 NEGATIVE NEGATIVE Final     Comment: (NOTE) If result is NEGATIVE SARS-CoV-2 target nucleic acids are NOT DETECTED. The SARS-CoV-2 RNA is generally detectable in upper and lower  respiratory specimens during the acute phase of infection. The lowest  concentration of SARS-CoV-2 viral copies this assay can detect is 250  copies / mL. A negative result does not preclude SARS-CoV-2 infection  and should not be used as the sole basis for treatment or other  patient management decisions.  A negative result may occur with  improper specimen collection / handling, submission of specimen other  than nasopharyngeal swab, presence of viral mutation(s) within the  areas targeted by this assay, and inadequate number of viral copies  (<250 copies / mL). A negative result must be combined with clinical  observations, patient history, and epidemiological information. If result is POSITIVE SARS-CoV-2 target nucleic acids are DETECTED. The SARS-CoV-2 RNA is generally detectable in upper and lower  respiratory specimens dur ing the acute phase of infection.  Positive  results are indicative of active infection with SARS-CoV-2.  Clinical  correlation with patient history and other diagnostic information is  necessary to determine patient infection status.  Positive results do  not rule out bacterial infection or co-infection with other viruses. If result is PRESUMPTIVE POSTIVE SARS-CoV-2 nucleic acids MAY BE PRESENT.   A presumptive positive result was obtained on the submitted specimen  and confirmed on repeat testing.  While 2019 novel coronavirus  (SARS-CoV-2) nucleic acids may be present in the submitted sample  additional confirmatory testing may be necessary for epidemiological  and / or clinical management purposes  to differentiate between  SARS-CoV-2 and other Sarbecovirus currently known to infect humans.  If clinically indicated additional testing with an alternate test  methodology [phone]) is advised. The SARS-CoV-2  RNA is generally  detectable in upper and lower respiratory sp ecimens during the acute  phase of infection. The expected result is Negative. Fact Sheet for Patients:  BoilerBrush.com.cy Fact Sheet for Healthcare Providers: https://pope.com/ This test is not yet approved or cleared by the [place] FDA and has been authorized for detection and/or diagnosis of SARS-CoV-2 by FDA under  an Emergency Use Authorization (EUA).  This EUA will remain in effect (meaning this test can be used) for the duration of the COVID-19 declaration under Section 564(b)(1) of the Act, 21 U.S.C. section 360bbb-3(b)(1), unless the authorization is terminated or revoked sooner. Performed at [facility], [address], [place], [place] [address]     Radiology Reports Ct Angio Chest Pe W Or Wo Contrast  Result Date: 12/09/2018 CLINICAL DATA:  Inpatient. Dyspnea and chest pain. Atrial fibrillation. EXAM: CT ANGIOGRAPHY CHEST WITH CONTRAST TECHNIQUE: Multidetector CT imaging of the chest was performed using the standard protocol during bolus administration of intravenous contrast. Multiplanar CT image reconstructions and MIPs were obtained to evaluate the vascular anatomy. CONTRAST:  75mL OMNIPAQUE IOHEXOL 350 MG/ML SOLN COMPARISON:  Chest radiograph from one day prior. FINDINGS: Cardiovascular: The study is moderate quality for the evaluation of pulmonary embolism, with some motion degradation. There are no filling defects in the central, lobar, segmental or subsegmental pulmonary artery branches to suggest acute pulmonary embolism. Atherosclerotic nonaneurysmal thoracic aorta. Normal caliber pulmonary arteries. Borderline mild cardiomegaly. No significant pericardial fluid/thickening. Mediastinum/Nodes: No discrete thyroid nodules. Unremarkable esophagus. No pathologically enlarged axillary, mediastinal or hilar lymph nodes. Lungs/Pleura: No pneumothorax.  Moderate dependent bilateral pleural effusions with moderate passive atelectasis in the dependent lower lobes bilaterally. No central airway stenoses. No lung masses or significant pulmonary nodules in the aerated portions of the lungs. Mild patchy ground-glass attenuation and minimal interlobular septal thickening in both lungs. Upper abdomen: Small hiatal hernia. Partially visualize simple 1.3 cm medial upper right renal cyst. Musculoskeletal: No aggressive appearing focal osseous lesions. Moderate thoracic spondylosis. Review of the MIP images confirms the above findings. IMPRESSION: 1. No pulmonary embolism. 2. Moderate dependent bilateral pleural effusions with moderate passive atelectasis in the lower lobes. 3. Borderline mild cardiomegaly. Mild patchy ground-glass attenuation and minimal interlobular septal thickening in the lungs, suggesting mild cardiogenic pulmonary edema. 4. Small hiatal hernia. Aortic Atherosclerosis (ICD10-I70.0). Electronically Signed   By: Delbert Phenix M.D.   On: 12/09/2018 10:21   Dg Chest Port 1 View  Result Date: 12/08/2018 CLINICAL DATA:  Shortness of breath EXAM: PORTABLE CHEST 1 VIEW COMPARISON:  None. FINDINGS: There are small pleural effusions bilaterally with bibasilar atelectasis. There is no edema or consolidation. Heart is upper normal in size with pulmonary vascularity normal. No adenopathy. There old healed rib fractures on the right. IMPRESSION: Small pleural effusions with bibasilar atelectasis. No frank edema or consolidation. Heart upper normal in size. Electronically Signed   By: Bretta Bang III M.D.   On: 12/08/2018 11:17     CBC Recent Labs  Lab 12/08/18 1046 12/09/18 0321  WBC 13.9* 9.0  HGB 15.4 13.5  HCT 46.7 41.4  PLT 253 217  MCV 95.7 96.1  MCH 31.6 31.3  MCHC 33.0 32.6  RDW 12.7 12.5  LYMPHSABS 1.5  --   MONOABS 1.2*  --   EOSABS 0.2  --   BASOSABS 0.1  --     Chemistries  Recent Labs  Lab 12/08/18 1046 12/09/18 0321  NA  141 143  K 3.8 3.7  CL 108 110  CO2 18* 23  GLUCOSE 129* 90  BUN 28* 31*  CREATININE 1.48* 1.19  CALCIUM 8.6* 8.6*  MG  --  2.2  AST 24  --   ALT 18  --   ALKPHOS 53  --   BILITOT 1.1  --    ------------------------------------------------------------------------------------------------------------------ estimated creatinine clearance is 41.7 mL/min (by C-G formula based  on SCr of 1.19 mg/dL). ------------------------------------------------------------------------------------------------------------------ No results for input(s): HGBA1C in the last 72 hours. ------------------------------------------------------------------------------------------------------------------ No results for input(s): CHOL, HDL, LDLCALC, TRIG, CHOLHDL, LDLDIRECT in the last 72 hours. ------------------------------------------------------------------------------------------------------------------ Recent Labs    12/09/18 0321  TSH 1.608   ------------------------------------------------------------------------------------------------------------------ No results for input(s): VITAMINB12, FOLATE, FERRITIN, TIBC, IRON, RETICCTPCT in the last 72 hours.  Coagulation profile Recent Labs  Lab 12/08/18 1046  INR 1.1    No results for input(s): DDIMER in the last 72 hours.  Cardiac Enzymes Recent Labs  Lab 12/08/18 1046 12/08/18 1447 12/08/18 1639  TROPONINI <0.03 <0.03 <0.03   ------------------------------------------------------------------------------------------------------------------ Invalid input(s): POCBNP    Assessment & Plan   85 y.o. male with a known history of hyperlipidemia, depression and anxiety, hypertension and insomnia presenting to the ED with chief complaints of chest pain, shortness of breath and facial pain.  1. New onset Atrial Fibrillation with RVR  Heart rate stable Now on metoprolol Continue Eliquis twice daily    2.  Acute respiratory failure CT scan was  ordered by me and shows bilateral pleural effusions consistent with acute systolic CHF Continue diuresis  3.. AKI -BUN/Cr elevated -Follow renal function  4.. HLD + Goal LDL<100 - Simvastatin  PO qhs  5. HTN + Goal BP <130/80 - Beta-blocker: Atenolol held due to RVR   6. Thromboembolism Risk Management CHADS2 = 2 - Eliquis     Code Status Orders  (From admission, onward)         Start     Ordered   12/08/18 1320  Do not attempt resuscitation (DNR)  Continuous    Question Answer Comment  In the event of cardiac or respiratory ARREST Do not call a "code blue"   In the event of cardiac or respiratory ARREST Do not perform Intubation, CPR, defibrillation or ACLS   In the event of cardiac or respiratory ARREST Use medication by any route, position, wound care, and other measures to relive pain and suffering. May use oxygen, suction and manual treatment of airway obstruction as needed for comfort.      12/08/18 1323        Code Status History    This patient has a current code status but no historical code status.           Consults   Cardiology DVT Prophylaxis Eliquis Lab Results  Component Value Date   PLT 217 12/09/2018     Time Spent in minutes Greater than 50% of time spent in care coordination and counseling patient regarding the condition and plan of care.   Auburn Bilberry M.D on 12/10/2018 at 3:26 PM  Between 7am to 6pm - Pager - [phone]  After 6pm go to [email or web address] - Social research officer, government  [employer]   Office  [phone]

## 2018-12-10 NOTE — Progress Notes (Signed)
[facility]  SUBJECTIVE: The patient reports feeling well this morning, is even making jokes, and denies chest pain, palpitations, or shortness of breath.   Vitals:   12/09/18 2300 12/09/18 2354 12/10/18 0537 12/10/18 0742  BP: (!) 117/91  96/76   Pulse: (!) 101 79 74   Resp:   18   Temp:   97.7 F (36.5 C)   TempSrc:   Oral   SpO2:  94% 91% 91%  Weight:   68.9 kg   Height:         Intake/Output Summary (Last 24 hours) at 12/10/2018 0915 Last data filed at 12/10/2018 0800 Gross per 24 hour  Intake 296.22 ml  Output 3250 ml  Net -2953.78 ml      PHYSICAL EXAM  General: Well developed, well nourished, in no acute distress HEENT:  Normocephalic and atramatic Neck:  No JVD.  Lungs: normal effort of breathing on room air, bibasilar crackles, no wheezing Heart: irregularly irregular. Normal S1 and S2 without gallops or murmurs.  Abdomen: nondistended  Msk:  Back normal, gait not assessed. Normal strength and tone for age. Extremities: No clubbing, cyanosis or edema.   Neuro: Alert and oriented X 3. Psych:  Good affect, responds appropriately   LABS: Basic Metabolic Panel: Recent Labs    12/08/18 1046 12/09/18 0321  NA 141 143  K 3.8 3.7  CL 108 110  CO2 18* 23  GLUCOSE 129* 90  BUN 28* 31*  CREATININE 1.48* 1.19  CALCIUM 8.6* 8.6*  MG  --  2.2   Liver Function Tests: Recent Labs    12/08/18 1046  AST 24  ALT 18  ALKPHOS 53  BILITOT 1.1  PROT 7.0  ALBUMIN 4.1   No results for input(s): LIPASE, AMYLASE in the last 72 hours. CBC: Recent Labs    12/08/18 1046 12/09/18 0321  WBC 13.9* 9.0  NEUTROABS 10.8*  --   HGB 15.4 13.5  HCT 46.7 41.4  MCV 95.7 96.1  PLT 253 217   Cardiac Enzymes: Recent Labs    12/08/18 1046 12/08/18 1447 12/08/18 1639  TROPONINI <0.03 <0.03 <0.03   BNP: Invalid input(s): POCBNP D-Dimer: No results for input(s): DDIMER in the last 72 hours. Hemoglobin A1C: No results for input(s): HGBA1C in the last 72  hours. Fasting Lipid Panel: No results for input(s): CHOL, HDL, LDLCALC, TRIG, CHOLHDL, LDLDIRECT in the last 72 hours. Thyroid Function Tests: Recent Labs    12/09/18 0321  TSH 1.608   Anemia Panel: No results for input(s): VITAMINB12, FOLATE, FERRITIN, TIBC, IRON, RETICCTPCT in the last 72 hours.  Ct Angio Chest Pe W Or Wo Contrast  Result Date: 12/09/2018 CLINICAL DATA:  Inpatient. Dyspnea and chest pain. Atrial fibrillation. EXAM: CT ANGIOGRAPHY CHEST WITH CONTRAST TECHNIQUE: Multidetector CT imaging of the chest was performed using the standard protocol during bolus administration of intravenous contrast. Multiplanar CT image reconstructions and MIPs were obtained to evaluate the vascular anatomy. CONTRAST:  75mL OMNIPAQUE IOHEXOL 350 MG/ML SOLN COMPARISON:  Chest radiograph from one day prior. FINDINGS: Cardiovascular: The study is moderate quality for the evaluation of pulmonary embolism, with some motion degradation. There are no filling defects in the central, lobar, segmental or subsegmental pulmonary artery branches to suggest acute pulmonary embolism. Atherosclerotic nonaneurysmal thoracic aorta. Normal caliber pulmonary arteries. Borderline mild cardiomegaly. No significant pericardial fluid/thickening. Mediastinum/Nodes: No discrete thyroid nodules. Unremarkable esophagus. No pathologically enlarged axillary, mediastinal or hilar lymph nodes. Lungs/Pleura: No pneumothorax. Moderate dependent bilateral pleural effusions with moderate passive  atelectasis in the dependent lower lobes bilaterally. No central airway stenoses. No lung masses or significant pulmonary nodules in the aerated portions of the lungs. Mild patchy ground-glass attenuation and minimal interlobular septal thickening in both lungs. Upper abdomen: Small hiatal hernia. Partially visualize simple 1.3 cm medial upper right renal cyst. Musculoskeletal: No aggressive appearing focal osseous lesions. Moderate thoracic  spondylosis. Review of the MIP images confirms the above findings. IMPRESSION: 1. No pulmonary embolism. 2. Moderate dependent bilateral pleural effusions with moderate passive atelectasis in the lower lobes. 3. Borderline mild cardiomegaly. Mild patchy ground-glass attenuation and minimal interlobular septal thickening in the lungs, suggesting mild cardiogenic pulmonary edema. 4. Small hiatal hernia. Aortic Atherosclerosis (ICD10-I70.0). Electronically Signed   By: Delbert Phenix M.D.   On: 12/09/2018 10:21   Dg Chest Port 1 View  Result Date: 12/08/2018 CLINICAL DATA:  Shortness of breath EXAM: PORTABLE CHEST 1 VIEW COMPARISON:  None. FINDINGS: There are small pleural effusions bilaterally with bibasilar atelectasis. There is no edema or consolidation. Heart is upper normal in size with pulmonary vascularity normal. No adenopathy. There old healed rib fractures on the right. IMPRESSION: Small pleural effusions with bibasilar atelectasis. No frank edema or consolidation. Heart upper normal in size. Electronically Signed   By: Bretta Bang III M.D.   On: 12/08/2018 11:17     Echo EF 35-40%, mild to moderate MR, moderate TR  TELEMETRY: atrial fibrillation, rate 120s  ASSESSMENT AND PLAN:  Active Problems:   Atrial fibrillation with rapid ventricular response (HCC)    1. New onset atrial fibrillation with RVR, on Cardizem drip, rates around low 100s-120s. Chads vasc score of 2 (age, HTN). Patient asymptomatic. 2D echocardiogram revealed LVEF 35-40%, likely appears lower due to patient being in atrial fibrillation with RVR. Can assess reduced LVEF as outpatient once patient is in sinus rhythm. 2. Acute systolic CHF exacerbated by atrial fibrillation with RVR, with bilateral pleural effusions, receiving IV Lasix. BNP 257. Troponin negative x 3. Adding beta blocker. Will hold off on ACE-I or ARB for now due to soft BP.  3. Hypertension, on Lasix, low dose metoprolol  Recommendations: 1.  Discontinue Cardizem with reduced EF 2. Add low dose metoprolol succinate 12.5 mg as blood pressure is soft 3. Add amiodarone 200 mg for rhythm control  4. Continue IV Lasix 40 mg with careful monitoring of renal status and blood pressure response 5. Continue Eliquis for stroke prevention   Leanora Ivanoff, PA-C 12/10/2018 9:15 AM

## 2018-12-11 ENCOUNTER — Inpatient Hospital Stay: Payer: Medicare Other

## 2018-12-11 MED ORDER — CIPROFLOXACIN HCL 500 MG PO TABS
250.0000 mg | ORAL_TABLET | Freq: Two times a day (BID) | ORAL | Status: DC
  Administered 2018-12-11 – 2018-12-12 (×3): 250 mg via ORAL
  Filled 2018-12-11 (×5): qty 0.5

## 2018-12-11 MED ORDER — ENSURE ENLIVE PO LIQD
237.0000 mL | Freq: Two times a day (BID) | ORAL | Status: DC
  Administered 2018-12-11: 237 mL via ORAL

## 2018-12-11 MED ORDER — ADULT MULTIVITAMIN W/MINERALS CH
1.0000 | ORAL_TABLET | Freq: Every day | ORAL | Status: DC
  Administered 2018-12-12: 1 via ORAL
  Filled 2018-12-11: qty 1

## 2018-12-11 MED ORDER — ACETYLCYSTEINE 20 % IN SOLN: 600.0000 mg | Freq: Two times a day (BID) | RESPIRATORY_TRACT | Status: DC

## 2018-12-11 MED ORDER — METOPROLOL SUCCINATE ER 25 MG PO TB24
25.0000 mg | ORAL_TABLET | Freq: Every day | ORAL | Status: DC
  Administered 2018-12-11 – 2018-12-12 (×2): 25 mg via ORAL
  Filled 2018-12-11 (×2): qty 1

## 2018-12-11 NOTE — Plan of Care (Signed)
Receiving IV lasix & Iv steroids, BM last night 6/3  Problem: Clinical Measurements: Goal: Respiratory complications will improve Outcome: Progressing Note:  On room air   Problem: Activity: Goal: Risk for activity intolerance will decrease Outcome: Progressing Note:  Walked around nurses station 3 times, independently, tolerated well   Problem: Coping: Goal: Level of anxiety will decrease Outcome: Progressing   Problem: Pain Managment: Goal: General experience of comfort will improve Outcome: Progressing Note:  Complaints of a headache once, treated with tylenol    Problem: Safety: Goal: Ability to remain free from injury will improve Outcome: Progressing   Problem: Skin Integrity: Goal: Risk for impaired skin integrity will decrease Outcome: Progressing   Problem: Nutrition: Goal: Adequate nutrition will be maintained Outcome: Completed/Met

## 2018-12-11 NOTE — Plan of Care (Signed)
Nutrition Education Note  RD consulted for nutrition education regarding new onset CHF.  85 y.o. male with a known history of hyperlipidemia, depression and anxiety, hypertension and insomnia presenting to the ED with chief complaints of chest pain, shortness of breath and facial pain. Pt found to have Afib and CHF  Spoke with pt via phone. Pt reports fair appetite and oral intake pta; pt reports " I aint no big eater". Pt reports eating one meal per day and frequent snacking. Pt reports he used to drink chocolate protein shakes but he stopped because they quit making the kind he likes. Pt currently eating 100% of meals. Pt is willing to drink chocolate supplements while in hospital. There is no weight history in chart to determine if any recent loss but pt denies any weight loss.   RD provided "Low Sodium Nutrition Therapy" handout from the [employer] via mail. Reviewed patient's dietary recall. Provided examples on ways to decrease sodium intake in diet. Discouraged intake of processed foods and use of salt shaker. Encouraged fresh fruits and vegetables as well as whole grain sources of carbohydrates to maximize fiber intake.   RD discussed why it is important for patient to adhere to diet recommendations, and emphasized the role of fluids, foods to avoid, and importance of weighing self daily. Teach back method used.  Expect fair compliance.  Body mass index is 24.32 kg/m. Pt meets criteria for normal weight for age based on current BMI.  Current diet order is 2 gram sodium diet, patient is consuming approximately 100% of meals at this time. Labs and medications reviewed.   RD will add Ensure Enlive po BID, each supplement provides 350 kcal and 20 grams of protein  No further nutrition interventions warranted at this time. RD contact information provided. If additional nutrition issues arise, please re-consult RD.   Betsey Holiday MS, RD, LDN Pager #-  [phone] Office#- [phone] After Hours Pager: [phone]

## 2018-12-11 NOTE — Progress Notes (Signed)
Pt HR is going from 119-140 lowest one I saw was HR went to 106. Notify [facility] and talked to Marylene Land and order that if pt HR sustain at 130 to get EKG and give 2.5mg  IV metropolol one time dose.. Will continue to monitor.

## 2018-12-11 NOTE — Progress Notes (Signed)
[employer] - [place] at [facility]                                                                                                                                                                                  Patient Demographics   Caleb Fleming, is a 85 y.o. male, DOB - May 03, 1932, FAO:[id number]  Admit date - 12/08/2018   Admitting Physician Jude Enid Baas, MD  Outpatient Primary MD for the patient is Steele Sizer, MD   LOS - 3  Subjective: Patient's breathing is improved    Review of Systems:   CONSTITUTIONAL: No documented fever. No fatigue, weakness. No weight gain, no weight loss.  EYES: No blurry or double vision.  ENT: No tinnitus. No postnasal drip. No redness of the oropharynx.  RESPIRATORY: Positive cough, no wheeze, no hemoptysis.  Positive dyspnea.  CARDIOVASCULAR: No chest pain. No orthopnea. No palpitations. No syncope.  GASTROINTESTINAL: No nausea, no vomiting or diarrhea. No abdominal pain. No melena or hematochezia.  GENITOURINARY: No dysuria or hematuria.  ENDOCRINE: No polyuria or nocturia. No heat or cold intolerance.  HEMATOLOGY: No anemia. No bruising. No bleeding.  INTEGUMENTARY: No rashes. No lesions.  MUSCULOSKELETAL: No arthritis. No swelling. No gout.  NEUROLOGIC: No numbness, tingling, or ataxia. No seizure-type activity.  PSYCHIATRIC: No anxiety. No insomnia. No ADD.    Vitals:   Vitals:   12/11/18 0436 12/11/18 0800 12/11/18 0845 12/11/18 0847  BP:   97/84 117/70  Pulse:   (!) 131 (!) 52  Resp:   16   Temp:      TempSrc:      SpO2:  92% 96% 97%  Weight: 68.4 kg     Height:        Wt Readings from Last 3 Encounters:  12/11/18 68.4 kg  12/01/14 75.3 kg     Intake/Output Summary (Last 24 hours) at 12/11/2018 1412 Last data filed at 12/11/2018 1256 Gross per 24 hour  Intake -  Output 850 ml  Net -850 ml    Physical Exam:   GENERAL: Pleasant-appearing in no apparent distress.  HEAD, EYES, EARS, NOSE AND THROAT:  Atraumatic, normocephalic. Extraocular muscles are intact. Pupils equal and reactive to light. Sclerae anicteric. No conjunctival injection. No oro-pharyngeal erythema.  NECK: Supple. There is no jugular venous distention. No bruits, no lymphadenopathy, no thyromegaly.  HEART: Regular rate and rhythm,. No murmurs, no rubs, no clicks.  LUNGS: Diminished sounds at the bases as well as crackles ABDOMEN: Soft, flat, nontender, nondistended. Has good bowel sounds. No hepatosplenomegaly appreciated.  EXTREMITIES: No evidence of any cyanosis, clubbing, or peripheral edema.  +2 pedal and radial pulses bilaterally.  NEUROLOGIC: The patient  is alert, awake, and oriented x3 with no focal motor or sensory deficits appreciated bilaterally.  SKIN: Moist and warm with no rashes appreciated.  Psych: Not anxious, depressed LN: No inguinal LN enlargement    Antibiotics   Anti-infectives (From admission, onward)   Start     Dose/Rate Route Frequency Ordered Stop   12/11/18 0900  ciprofloxacin (CIPRO) tablet 250 mg     250 mg Oral 2 times daily 12/11/18 0854        Medications   Scheduled Meds: . amiodarone  200 mg Oral Daily  . apixaban  5 mg Oral BID  . budesonide (PULMICORT) nebulizer solution  0.25 mg Nebulization BID  . ciprofloxacin  250 mg Oral BID  . citalopram  20 mg Oral Daily  . ferrous gluconate  324 mg Oral Q breakfast  . furosemide  40 mg Intravenous Q12H  . guaiFENesin  600 mg Oral BID  . levalbuterol  1.25 mg Nebulization BID  . methylPREDNISolone (SOLU-MEDROL) injection  60 mg Intravenous Q6H  . metoprolol succinate  25 mg Oral Daily  . simvastatin  20 mg Oral q1800   Continuous Infusions: PRN Meds:.acetaminophen, guaiFENesin-dextromethorphan, ondansetron (ZOFRAN) IV, zolpidem   Data Review:   Micro Results Recent Results (from the past 240 hour(s))  SARS Coronavirus 2 (CEPHEID - Performed in [facility] hospital lab), Hosp Order     Status: None   Collection Time: 12/08/18  12:14 PM  Result Value Ref Range Status   SARS Coronavirus 2 NEGATIVE NEGATIVE Final    Comment: (NOTE) If result is NEGATIVE SARS-CoV-2 target nucleic acids are NOT DETECTED. The SARS-CoV-2 RNA is generally detectable in upper and lower  respiratory specimens during the acute phase of infection. The lowest  concentration of SARS-CoV-2 viral copies this assay can detect is 250  copies / mL. A negative result does not preclude SARS-CoV-2 infection  and should not be used as the sole basis for treatment or other  patient management decisions.  A negative result may occur with  improper specimen collection / handling, submission of specimen other  than nasopharyngeal swab, presence of viral mutation(s) within the  areas targeted by this assay, and inadequate number of viral copies  (<250 copies / mL). A negative result must be combined with clinical  observations, patient history, and epidemiological information. If result is POSITIVE SARS-CoV-2 target nucleic acids are DETECTED. The SARS-CoV-2 RNA is generally detectable in upper and lower  respiratory specimens dur ing the acute phase of infection.  Positive  results are indicative of active infection with SARS-CoV-2.  Clinical  correlation with patient history and other diagnostic information is  necessary to determine patient infection status.  Positive results do  not rule out bacterial infection or co-infection with other viruses. If result is PRESUMPTIVE POSTIVE SARS-CoV-2 nucleic acids MAY BE PRESENT.   A presumptive positive result was obtained on the submitted specimen  and confirmed on repeat testing.  While 2019 novel coronavirus  (SARS-CoV-2) nucleic acids may be present in the submitted sample  additional confirmatory testing may be necessary for epidemiological  and / or clinical management purposes  to differentiate between  SARS-CoV-2 and other Sarbecovirus currently known to infect humans.  If clinically indicated  additional testing with an alternate test  methodology [phone]) is advised. The SARS-CoV-2 RNA is generally  detectable in upper and lower respiratory sp ecimens during the acute  phase of infection. The expected result is Negative. Fact Sheet for Patients:  BoilerBrush.com.cy Fact Sheet for  Healthcare Providers: https://pope.com/ This test is not yet approved or cleared by the [place] and has been authorized for detection and/or diagnosis of SARS-CoV-2 by FDA under an Emergency Use Authorization (EUA).  This EUA will remain in effect (meaning this test can be used) for the duration of the COVID-19 declaration under Section 564(b)(1) of the Act, 21 U.S.C. section 360bbb-3(b)(1), unless the authorization is terminated or revoked sooner. Performed at [facility], [address] [place], [place] [address]     Radiology Reports Ct Angio Chest Pe W Or Wo Contrast  Result Date: 12/09/2018 CLINICAL DATA:  Inpatient. Dyspnea and chest pain. Atrial fibrillation. EXAM: CT ANGIOGRAPHY CHEST WITH CONTRAST TECHNIQUE: Multidetector CT imaging of the chest was performed using the standard protocol during bolus administration of intravenous contrast. Multiplanar CT image reconstructions and MIPs were obtained to evaluate the vascular anatomy. CONTRAST:  75mL OMNIPAQUE IOHEXOL 350 MG/ML SOLN COMPARISON:  Chest radiograph from one day prior. FINDINGS: Cardiovascular: The study is moderate quality for the evaluation of pulmonary embolism, with some motion degradation. There are no filling defects in the central, lobar, segmental or subsegmental pulmonary artery branches to suggest acute pulmonary embolism. Atherosclerotic nonaneurysmal thoracic aorta. Normal caliber pulmonary arteries. Borderline mild cardiomegaly. No significant pericardial fluid/thickening. Mediastinum/Nodes: No discrete thyroid nodules. Unremarkable esophagus. No  pathologically enlarged axillary, mediastinal or hilar lymph nodes. Lungs/Pleura: No pneumothorax. Moderate dependent bilateral pleural effusions with moderate passive atelectasis in the dependent lower lobes bilaterally. No central airway stenoses. No lung masses or significant pulmonary nodules in the aerated portions of the lungs. Mild patchy ground-glass attenuation and minimal interlobular septal thickening in both lungs. Upper abdomen: Small hiatal hernia. Partially visualize simple 1.3 cm medial upper right renal cyst. Musculoskeletal: No aggressive appearing focal osseous lesions. Moderate thoracic spondylosis. Review of the MIP images confirms the above findings. IMPRESSION: 1. No pulmonary embolism. 2. Moderate dependent bilateral pleural effusions with moderate passive atelectasis in the lower lobes. 3. Borderline mild cardiomegaly. Mild patchy ground-glass attenuation and minimal interlobular septal thickening in the lungs, suggesting mild cardiogenic pulmonary edema. 4. Small hiatal hernia. Aortic Atherosclerosis (ICD10-I70.0). Electronically Signed   By: Delbert Phenix M.D.   On: 12/09/2018 10:21   Dg Chest Port 1 View  Result Date: 12/11/2018 CLINICAL DATA:  Increasing shortness of breath EXAM: PORTABLE CHEST 1 VIEW COMPARISON:  12/09/2018 FINDINGS: Cardiac shadow is at the upper limits of normal in size but accentuated by the frontal technique. The lungs are well aerated bilaterally with the exception of a small left pleural effusion. No focal infiltrate is seen. Old rib fractures are noted on the right. IMPRESSION: Small left pleural effusion. Old rib fractures on the right. Electronically Signed   By: Alcide Clever M.D.   On: 12/11/2018 10:04   Dg Chest Port 1 View  Result Date: 12/08/2018 CLINICAL DATA:  Shortness of breath EXAM: PORTABLE CHEST 1 VIEW COMPARISON:  None. FINDINGS: There are small pleural effusions bilaterally with bibasilar atelectasis. There is no edema or consolidation. Heart  is upper normal in size with pulmonary vascularity normal. No adenopathy. There old healed rib fractures on the right. IMPRESSION: Small pleural effusions with bibasilar atelectasis. No frank edema or consolidation. Heart upper normal in size. Electronically Signed   By: Bretta Bang III M.D.   On: 12/08/2018 11:17     CBC Recent Labs  Lab 12/08/18 1046 12/09/18 0321  WBC 13.9* 9.0  HGB 15.4 13.5  HCT 46.7 41.4  PLT 253 217  MCV  95.7 96.1  MCH 31.6 31.3  MCHC 33.0 32.6  RDW 12.7 12.5  LYMPHSABS 1.5  --   MONOABS 1.2*  --   EOSABS 0.2  --   BASOSABS 0.1  --     Chemistries  Recent Labs  Lab 12/08/18 1046 12/09/18 0321  NA 141 143  K 3.8 3.7  CL 108 110  CO2 18* 23  GLUCOSE 129* 90  BUN 28* 31*  CREATININE 1.48* 1.19  CALCIUM 8.6* 8.6*  MG  --  2.2  AST 24  --   ALT 18  --   ALKPHOS 53  --   BILITOT 1.1  --    ------------------------------------------------------------------------------------------------------------------ estimated creatinine clearance is 41.7 mL/min (by C-G formula based on SCr of 1.19 mg/dL). ------------------------------------------------------------------------------------------------------------------ No results for input(s): HGBA1C in the last 72 hours. ------------------------------------------------------------------------------------------------------------------ No results for input(s): CHOL, HDL, LDLCALC, TRIG, CHOLHDL, LDLDIRECT in the last 72 hours. ------------------------------------------------------------------------------------------------------------------ Recent Labs    12/09/18 0321  TSH 1.608   ------------------------------------------------------------------------------------------------------------------ No results for input(s): VITAMINB12, FOLATE, FERRITIN, TIBC, IRON, RETICCTPCT in the last 72 hours.  Coagulation profile Recent Labs  Lab 12/08/18 1046  INR 1.1    No results for input(s): DDIMER in the last  72 hours.  Cardiac Enzymes Recent Labs  Lab 12/08/18 1046 12/08/18 1447 12/08/18 1639  TROPONINI <0.03 <0.03 <0.03   ------------------------------------------------------------------------------------------------------------------ Invalid input(s): POCBNP    Assessment & Plan   85 y.o. male with a known history of hyperlipidemia, depression and anxiety, hypertension and insomnia presenting to the ED with chief complaints of chest pain, shortness of breath and facial pain.  1. New onset Atrial Fibrillation with RVR  Heart rate stable Metoprolol increased today Continue Eliquis twice daily    2.  Acute respiratory failure CT scan was ordered by me and shows bilateral pleural effusions consistent with acute systolic CHF Continue diuresis  3.. AKI -BUN/Cr elevated -Renal function stable  4.. HLD + Goal LDL<100 - Simvastatin 20mg  PO qhs  5. HTN + Goal BP <130/80 -Continue metoprolol   6. Thromboembolism Risk Management CHADS2 = 2 - Eliquis     Code Status Orders  (From admission, onward)         Start     Ordered   12/08/18 1320  Do not attempt resuscitation (DNR)  Continuous    Question Answer Comment  In the event of cardiac or respiratory ARREST Do not call a "code blue"   In the event of cardiac or respiratory ARREST Do not perform Intubation, CPR, defibrillation or ACLS   In the event of cardiac or respiratory ARREST Use medication by any route, position, wound care, and other measures to relive pain and suffering. May use oxygen, suction and manual treatment of airway obstruction as needed for comfort.      12/08/18 1323        Code Status History    This patient has a current code status but no historical code status.           Consults   Cardiology DVT Prophylaxis Eliquis Lab Results  Component Value Date   PLT 217 12/09/2018     Time Spent in minutes 35min Greater than 50% of time spent in care coordination and counseling  patient regarding the condition and plan of care.   Auburn BilberryShreyang Kayliegh Boyers M.D on 12/11/2018 at 2:12 PM  Between 7am to 6pm - Pager - [phone]  After 6pm go to [email or web address] - Librarian, [email or web address] [email or web address]  [employer]   Office  [phone]  

## 2018-12-11 NOTE — Plan of Care (Signed)
Patient remains in Afib HR anywhere from 90's-140's at rest, cardio made aware, metop was increased, following for improvement.   Patient able to ambulate with minimal assistance.  No c/o pain.   Problem: Activity: Goal: Risk for activity intolerance will decrease Outcome: Progressing   Problem: Elimination: Goal: Will not experience complications related to bowel motility Outcome: Progressing   Problem: Safety: Goal: Ability to remain free from injury will improve Outcome: Progressing   Problem: Cardiac: Goal: Ability to achieve and maintain adequate cardiopulmonary perfusion will improve Outcome: Not Progressing   Problem: Cardiac: Goal: Ability to achieve and maintain adequate cardiopulmonary perfusion will improve Outcome: Not Progressing

## 2018-12-11 NOTE — Progress Notes (Signed)
[facility]  SUBJECTIVE: The patient has no complaints this morning, other than requesting to go home today. His breathing is better. He denies chest pain or palpitations.   Vitals:   12/10/18 2039 12/11/18 0419 12/11/18 0436 12/11/18 0800  BP:  116/83    Pulse:  98    Resp:  18    Temp:  97.9 F (36.6 C)    TempSrc:  Oral    SpO2: 95% 93%  92%  Weight:   68.4 kg   Height:         Intake/Output Summary (Last 24 hours) at 12/11/2018 0825 Last data filed at 12/11/2018 0444 Gross per 24 hour  Intake 240 ml  Output 850 ml  Net -610 ml      PHYSICAL EXAM  General: Elderly gentleman sitting on side of bed after walking to bathroom, in no acute distress HEENT:  Normocephalic and atramatic Neck:  No JVD.  Lungs: some wheezing, no crackles, normal effort of breathing on room air. Slightly dyspneic after walking to bathroom. Heart: irregularly irregular. Normal S1 and S2 without gallops or murmurs.  Abdomen: nondistended Msk:  Back normal. Normal strength and tone for age. Extremities: No clubbing, cyanosis or edema.   Neuro: Alert and oriented X 3. Psych:  Good affect, responds appropriately   LABS: Basic Metabolic Panel: Recent Labs    12/08/18 1046 12/09/18 0321  NA 141 143  K 3.8 3.7  CL 108 110  CO2 18* 23  GLUCOSE 129* 90  BUN 28* 31*  CREATININE 1.48* 1.19  CALCIUM 8.6* 8.6*  MG  --  2.2   Liver Function Tests: Recent Labs    12/08/18 1046  AST 24  ALT 18  ALKPHOS 53  BILITOT 1.1  PROT 7.0  ALBUMIN 4.1   No results for input(s): LIPASE, AMYLASE in the last 72 hours. CBC: Recent Labs    12/08/18 1046 12/09/18 0321  WBC 13.9* 9.0  NEUTROABS 10.8*  --   HGB 15.4 13.5  HCT 46.7 41.4  MCV 95.7 96.1  PLT 253 217   Cardiac Enzymes: Recent Labs    12/08/18 1046 12/08/18 1447 12/08/18 1639  TROPONINI <0.03 <0.03 <0.03   BNP: Invalid input(s): POCBNP D-Dimer: No results for input(s): DDIMER in the last 72 hours. Hemoglobin A1C: No  results for input(s): HGBA1C in the last 72 hours. Fasting Lipid Panel: No results for input(s): CHOL, HDL, LDLCALC, TRIG, CHOLHDL, LDLDIRECT in the last 72 hours. Thyroid Function Tests: Recent Labs    12/09/18 0321  TSH 1.608   Anemia Panel: No results for input(s): VITAMINB12, FOLATE, FERRITIN, TIBC, IRON, RETICCTPCT in the last 72 hours.  Ct Angio Chest Pe W Or Wo Contrast  Result Date: 12/09/2018 CLINICAL DATA:  Inpatient. Dyspnea and chest pain. Atrial fibrillation. EXAM: CT ANGIOGRAPHY CHEST WITH CONTRAST TECHNIQUE: Multidetector CT imaging of the chest was performed using the standard protocol during bolus administration of intravenous contrast. Multiplanar CT image reconstructions and MIPs were obtained to evaluate the vascular anatomy. CONTRAST:  75mL OMNIPAQUE IOHEXOL 350 MG/ML SOLN COMPARISON:  Chest radiograph from one day prior. FINDINGS: Cardiovascular: The study is moderate quality for the evaluation of pulmonary embolism, with some motion degradation. There are no filling defects in the central, lobar, segmental or subsegmental pulmonary artery branches to suggest acute pulmonary embolism. Atherosclerotic nonaneurysmal thoracic aorta. Normal caliber pulmonary arteries. Borderline mild cardiomegaly. No significant pericardial fluid/thickening. Mediastinum/Nodes: No discrete thyroid nodules. Unremarkable esophagus. No pathologically enlarged axillary, mediastinal or hilar lymph nodes.  Lungs/Pleura: No pneumothorax. Moderate dependent bilateral pleural effusions with moderate passive atelectasis in the dependent lower lobes bilaterally. No central airway stenoses. No lung masses or significant pulmonary nodules in the aerated portions of the lungs. Mild patchy ground-glass attenuation and minimal interlobular septal thickening in both lungs. Upper abdomen: Small hiatal hernia. Partially visualize simple 1.3 cm medial upper right renal cyst. Musculoskeletal: No aggressive appearing focal  osseous lesions. Moderate thoracic spondylosis. Review of the MIP images confirms the above findings. IMPRESSION: 1. No pulmonary embolism. 2. Moderate dependent bilateral pleural effusions with moderate passive atelectasis in the lower lobes. 3. Borderline mild cardiomegaly. Mild patchy ground-glass attenuation and minimal interlobular septal thickening in the lungs, suggesting mild cardiogenic pulmonary edema. 4. Small hiatal hernia. Aortic Atherosclerosis (ICD10-I70.0). Electronically Signed   By: Delbert Phenix M.D.   On: 12/09/2018 10:21     Echo EF 35-40% (in setting of rapid atrial fibrillation)  TELEMETRY: Atrial fibrillation, rates 120-140s with minimal activity  ASSESSMENT AND PLAN:  Active Problems:   Atrial fibrillation with rapid ventricular response (HCC)    1. New onset atrial fibrillation with RVR, on Cardizem drip, rates around low 100s-120s. Chads vasc score of 2 (age, HTN). Patient asymptomatic. 2D echocardiogram revealed LVEF 35-40%, may appear lower due to patient being in atrial fibrillation with RVR. Can assess reduced LVEF as outpatient once patient is in sinus rhythm. Amiodarone added. Heart rate elevates to 130-140s with minimal activity. 2. Acute systolic CHF exacerbated by atrial fibrillation with RVR, with bilateral pleural effusions, receiving IV Lasix. Slowly improving. BNP 257. Troponin negative x 3. Currently on metoprolol. Will hold off on ACE-I or ARB for now due to soft BP.  3. Hypertension, on Lasix and metoprolol  Recommendations: 1. Increase metoprolol to 25 mg for better heart rate control, monitoring of low blood pressure 2. Continue IV Lasix 40 mg BID with careful monitoring of renal status 3. Continue amiodarone 200 mg once daily 4. Continue Eliquis for stroke prevention 5. Continue simvastatin  6. Plan to scheduled follow-up appointment with Dr. Darrold Junker for one week   Leanora Ivanoff, PA-C 12/11/2018 8:25 AM

## 2018-12-11 NOTE — Care Management Important Message (Signed)
Important Message  Patient Details  Name: Caleb Fleming. MRN: [id number] Date of Birth: 11-14-1931   Medicare Important Message Given:  Yes   Spoke with Boss Kubik, daughter-in-law, at [phone] to review upon patient's request.  [name] requested copy be sent to her email.  Copy sent securely to email address provided and confirmed: [email or web address].  Patient's daughter, Rosey Bath [phone]), is also aware of right.  Johnell Comings 12/11/2018, 2:36 PM

## 2018-12-12 LAB — BASIC METABOLIC PANEL
Anion gap: 11 (ref 5–15)
BUN: 53 mg/dL — ABNORMAL HIGH (ref 8–23)
CO2: 24 mmol/L (ref 22–32)
Calcium: 8.1 mg/dL — ABNORMAL LOW (ref 8.9–10.3)
Chloride: 101 mmol/L (ref 98–111)
Creatinine, Ser: 1.69 mg/dL — ABNORMAL HIGH (ref 0.61–1.24)
GFR calc Af Amer: 42 mL/min — ABNORMAL LOW (ref >60)
GFR calc non Af Amer: 36 mL/min — ABNORMAL LOW (ref >60)
Glucose, Bld: 143 mg/dL — ABNORMAL HIGH (ref 70–99)
Potassium: 3.2 mmol/L — ABNORMAL LOW (ref 3.5–5.1)
Sodium: 136 mmol/L (ref 135–145)

## 2018-12-12 MED ORDER — POTASSIUM CHLORIDE ER 10 MEQ PO TBCR: 10.0000 meq | EXTENDED_RELEASE_TABLET | Freq: Every day | ORAL | 0 refills | Status: DC

## 2018-12-12 MED ORDER — SIMVASTATIN 20 MG PO TABS: 20.0000 mg | ORAL_TABLET | Freq: Every day | ORAL | 0 refills | Status: DC

## 2018-12-12 MED ORDER — PREDNISONE 10 MG (21) PO TBPK: ORAL_TABLET | ORAL | 0 refills | Status: DC

## 2018-12-12 MED ORDER — FUROSEMIDE 40 MG PO TABS: 40.0000 mg | ORAL_TABLET | Freq: Two times a day (BID) | ORAL | 11 refills | Status: DC

## 2018-12-12 MED ORDER — TRAMADOL HCL 50 MG PO TABS: 50.0000 mg | ORAL_TABLET | Freq: Four times a day (QID) | ORAL | Status: DC | PRN

## 2018-12-12 MED ORDER — IPRATROPIUM-ALBUTEROL 20-100 MCG/ACT IN AERS: 1.0000 | INHALATION_SPRAY | Freq: Four times a day (QID) | RESPIRATORY_TRACT | 3 refills | Status: DC | PRN

## 2018-12-12 MED ORDER — CIPROFLOXACIN HCL 250 MG PO TABS: 250.0000 mg | ORAL_TABLET | Freq: Two times a day (BID) | ORAL | 0 refills | Status: AC

## 2018-12-12 MED ORDER — TRAMADOL HCL 50 MG PO TABS: 50.0000 mg | ORAL_TABLET | Freq: Four times a day (QID) | ORAL | 0 refills | Status: AC | PRN

## 2018-12-12 MED ORDER — METOPROLOL SUCCINATE ER 25 MG PO TB24: 25.0000 mg | ORAL_TABLET | Freq: Every day | ORAL | 0 refills | Status: DC

## 2018-12-12 MED ORDER — APIXABAN 5 MG PO TABS: 5.0000 mg | ORAL_TABLET | Freq: Two times a day (BID) | ORAL | 0 refills | Status: DC

## 2018-12-12 MED ORDER — AMIODARONE HCL 200 MG PO TABS: 200.0000 mg | ORAL_TABLET | Freq: Every day | ORAL | 0 refills | Status: DC

## 2018-12-12 MED ORDER — FLUTICASONE-SALMETEROL 250-50 MCG/DOSE IN AEPB: 1.0000 | INHALATION_SPRAY | Freq: Two times a day (BID) | RESPIRATORY_TRACT | 11 refills | Status: DC

## 2018-12-12 NOTE — Plan of Care (Signed)
  Problem: Health Behavior/Discharge Planning: Goal: Ability to manage health-related needs will improve Outcome: Adequate for Discharge   Problem: Clinical Measurements: Goal: Ability to maintain clinical measurements within normal limits will improve Outcome: Adequate for Discharge Goal: Will remain free from infection Outcome: Adequate for Discharge Goal: Diagnostic test results will improve Outcome: Adequate for Discharge Goal: Respiratory complications will improve Outcome: Adequate for Discharge Goal: Cardiovascular complication will be avoided Outcome: Adequate for Discharge   Problem: Activity: Goal: Risk for activity intolerance will decrease Outcome: Adequate for Discharge   Problem: Coping: Goal: Level of anxiety will decrease Outcome: Adequate for Discharge   Problem: Elimination: Goal: Will not experience complications related to bowel motility Outcome: Adequate for Discharge Goal: Will not experience complications related to urinary retention Outcome: Adequate for Discharge   Problem: Pain Managment: Goal: General experience of comfort will improve Outcome: Adequate for Discharge   Problem: Safety: Goal: Ability to remain free from injury will improve Outcome: Adequate for Discharge   Problem: Skin Integrity: Goal: Risk for impaired skin integrity will decrease Outcome: Adequate for Discharge   Problem: Education: Goal: Knowledge of disease or condition will improve Outcome: Adequate for Discharge Goal: Understanding of medication regimen will improve Outcome: Adequate for Discharge Goal: Individualized Educational Video(s) Outcome: Adequate for Discharge   Problem: Activity: Goal: Ability to tolerate increased activity will improve Outcome: Adequate for Discharge   Problem: Cardiac: Goal: Ability to achieve and maintain adequate cardiopulmonary perfusion will improve Outcome: Adequate for Discharge   Problem: Health Behavior/Discharge  Planning: Goal: Ability to safely manage health-related needs after discharge will improve Outcome: Adequate for Discharge   Problem: Education: Goal: Ability to demonstrate management of disease process will improve Outcome: Adequate for Discharge Goal: Ability to verbalize understanding of medication therapies will improve Outcome: Adequate for Discharge Goal: Individualized Educational Video(s) Outcome: Adequate for Discharge   Problem: Activity: Goal: Capacity to carry out activities will improve Outcome: Adequate for Discharge   Problem: Cardiac: Goal: Ability to achieve and maintain adequate cardiopulmonary perfusion will improve Outcome: Adequate for Discharge

## 2018-12-12 NOTE — Progress Notes (Signed)
Discharge orders for patient. Discharge instructions gone over with patient and daughter-in-law [name] over the phone. IV taken out and tele monitor off. Patient and daughter in law verbalized understanding with no questions or concerns. Patient feeling well, ready to go home. No complaints of chest pain or shortness of breath.

## 2018-12-12 NOTE — TOC Transition Note (Signed)
Transition of Care [facility]) - CM/SW Discharge Note   Patient Details  Name: Caleb Fleming. MRN: [id number] Date of Birth: [birth year]  Transition of [facility]) CM/SW Contact:  Eber Hong, RN Phone Number: 12/12/2018, 7:04 PM   Clinical Narrative:      no consult. Low risk. Discharge on Eliquis.  Patient verbally confirms he has medicare D coverage.  Provided with 30 day trial coupoln       Patient Goals and CMS Choice        Discharge Placement                       Discharge Plan and Services                                     Social Determinants of Health (SDOH) Interventions     Readmission Risk Interventions No flowsheet data found.

## 2018-12-12 NOTE — Progress Notes (Signed)
[facility] Cardiology  SUBJECTIVE: Resting comfortably in bed, no complaints   Vitals:   12/11/18 1632 12/11/18 1910 12/11/18 2012 12/12/18 0425  BP: 102/68 116/83  107/69  Pulse: (!) 129 (!) 103  (!) 58  Resp:  16  16  Temp:  (!) 97.5 F (36.4 C)  97.7 F (36.5 C)  TempSrc:  Oral  Oral  SpO2: 95% 96% 95% 92%  Weight:    67.2 kg  Height:         Intake/Output Summary (Last 24 hours) at 12/12/2018 0754 Last data filed at 12/12/2018 0342 Gross per 24 hour  Intake -  Output 1465 ml  Net -1465 ml      PHYSICAL EXAM  General: Well developed, well nourished, in no acute distress HEENT:  Normocephalic and atramatic Neck:  No JVD.  Lungs: Clear bilaterally to auscultation and percussion. Heart: HRRR . Normal S1 and S2 without gallops or murmurs.  Abdomen: Bowel sounds are positive, abdomen soft and non-tender  Msk:  Back normal, normal gait. Normal strength and tone for age. Extremities: No clubbing, cyanosis or edema.   Neuro: Alert and oriented X 3. Psych:  Good affect, responds appropriately   LABS: Basic Metabolic Panel: Recent Labs    12/12/18 0524  NA 136  K 3.2*  CL 101  CO2 24  GLUCOSE 143*  BUN 53*  CREATININE 1.69*  CALCIUM 8.1*   Liver Function Tests: No results for input(s): AST, ALT, ALKPHOS, BILITOT, PROT, ALBUMIN in the last 72 hours. No results for input(s): LIPASE, AMYLASE in the last 72 hours. CBC: No results for input(s): WBC, NEUTROABS, HGB, HCT, MCV, PLT in the last 72 hours. Cardiac Enzymes: No results for input(s): CKTOTAL, CKMB, CKMBINDEX, TROPONINI in the last 72 hours. BNP: Invalid input(s): POCBNP D-Dimer: No results for input(s): DDIMER in the last 72 hours. Hemoglobin A1C: No results for input(s): HGBA1C in the last 72 hours. Fasting Lipid Panel: No results for input(s): CHOL, HDL, LDLCALC, TRIG, CHOLHDL, LDLDIRECT in the last 72 hours. Thyroid Function Tests: No results for input(s): TSH, T4TOTAL, T3FREE, THYROIDAB in the last 72  hours.  Invalid input(s): FREET3 Anemia Panel: No results for input(s): VITAMINB12, FOLATE, FERRITIN, TIBC, IRON, RETICCTPCT in the last 72 hours.  Dg Chest Port 1 View  Result Date: 12/11/2018 CLINICAL DATA:  Increasing shortness of breath EXAM: PORTABLE CHEST 1 VIEW COMPARISON:  12/09/2018 FINDINGS: Cardiac shadow is at the upper limits of normal in size but accentuated by the frontal technique. The lungs are well aerated bilaterally with the exception of a small left pleural effusion. No focal infiltrate is seen. Old rib fractures are noted on the right. IMPRESSION: Small left pleural effusion. Old rib fractures on the right. Electronically Signed   By: Alcide Clever M.D.   On: 12/11/2018 10:04     Echo V EF 35 to 40%  TELEMETRY: Atrial fibrillation 100 bpm:  ASSESSMENT AND PLAN:  Active Problems:   Atrial fibrillation with rapid ventricular response (HCC)    1.  Atrial fibrillation, heart rate better controlled, patient appears asymptomatic 2.  Acute systolic congestive heart failure, with mild to moderate cardiomyopathy, exacerbated by atrial fibrillation with rapid ventricular rate, improved after diuresis  Recommendations  1.  Agree with overall current therapy 2.  Continue Eliquis for stroke prevention 3.  Continue amiodarone for rate control, no need to adjust dose 4.  Follow-up as outpatient next week  Sign off for now, please call if any questions   Marcina Millard,  MD, PhD, [facility] 12/12/2018 7:54 AM

## 2018-12-12 NOTE — Progress Notes (Signed)
Ambulated patient around the nurse's station x2. No chest pain, mild SOB. Heart rate in the 120s-130s while ambulating. Currently at rest 100's. Dr. Allena Katz and Dr. Cassie Freer notified.

## 2018-12-12 NOTE — Discharge Summary (Signed)
[facility] [place] at [facility]  Caleb Fleming., 85 y.o., DOB 01/24/1932, MRN [id number]. Admission date: 12/08/2018 Discharge Date 12/12/2018 Primary MD Steele Sizer, MD Admitting Physician Jude Enid Baas, MD  Admission Diagnosis  Atrial fibrillation with RVR [facility]) [I48.91]  Discharge Diagnosis       A. fib with RVR Acute respiratory failure due to acute systolic CHF Acute kidney injury Hyperlipidemia Hypertension Possible COPD  Hospital Course 86 y.o.malewith a known history ofhyperlipidemia, depression and anxiety, hypertension and insomnia presenting to the ED with chief complaints of chest pain, shortness of breath and chest pain.  She was noted to be in A. fib with RVR.  And having respiratory difficulties.  He was admitted and started on rate controlling measures.  His heart rate is much improved.  He was started on amiodarone by cardiology.  He was also started on Eliquis.  Patient also was having significant shortness of breath therefore CT scan of the chest was done which showed findings consistent with acute CHF.  Patient's breathing is much improved.  He is on room air now.  His echo showed systolic dysfunction.  Cardiology will follow him up as outpatient for further recommendations.  Patient was ambulated oxygen sats remained stable patient had mild shortness of breath but no chest pain.           Consults  cardiology  Significant Tests:  See full reports for all details     Ct Angio Chest Pe W Or Wo Contrast  Result Date: 12/09/2018 CLINICAL DATA:  Inpatient. Dyspnea and chest pain. Atrial fibrillation. EXAM: CT ANGIOGRAPHY CHEST WITH CONTRAST TECHNIQUE: Multidetector CT imaging of the chest was performed using the standard protocol during bolus administration of intravenous contrast. Multiplanar CT image reconstructions and MIPs were obtained to evaluate the vascular anatomy. CONTRAST:  36mL OMNIPAQUE IOHEXOL 350 MG/ML SOLN COMPARISON:   Chest radiograph from one day prior. FINDINGS: Cardiovascular: The study is moderate quality for the evaluation of pulmonary embolism, with some motion degradation. There are no filling defects in the central, lobar, segmental or subsegmental pulmonary artery branches to suggest acute pulmonary embolism. Atherosclerotic nonaneurysmal thoracic aorta. Normal caliber pulmonary arteries. Borderline mild cardiomegaly. No significant pericardial fluid/thickening. Mediastinum/Nodes: No discrete thyroid nodules. Unremarkable esophagus. No pathologically enlarged axillary, mediastinal or hilar lymph nodes. Lungs/Pleura: No pneumothorax. Moderate dependent bilateral pleural effusions with moderate passive atelectasis in the dependent lower lobes bilaterally. No central airway stenoses. No lung masses or significant pulmonary nodules in the aerated portions of the lungs. Mild patchy ground-glass attenuation and minimal interlobular septal thickening in both lungs. Upper abdomen: Small hiatal hernia. Partially visualize simple 1.3 cm medial upper right renal cyst. Musculoskeletal: No aggressive appearing focal osseous lesions. Moderate thoracic spondylosis. Review of the MIP images confirms the above findings. IMPRESSION: 1. No pulmonary embolism. 2. Moderate dependent bilateral pleural effusions with moderate passive atelectasis in the lower lobes. 3. Borderline mild cardiomegaly. Mild patchy ground-glass attenuation and minimal interlobular septal thickening in the lungs, suggesting mild cardiogenic pulmonary edema. 4. Small hiatal hernia. Aortic Atherosclerosis (ICD10-I70.0). Electronically Signed   By: Delbert Phenix M.D.   On: 12/09/2018 10:21   Dg Chest Port 1 View  Result Date: 12/11/2018 CLINICAL DATA:  Increasing shortness of breath EXAM: PORTABLE CHEST 1 VIEW COMPARISON:  12/09/2018 FINDINGS: Cardiac shadow is at the upper limits of normal in size but accentuated by the frontal technique. The lungs are well aerated  bilaterally with the exception of a small left pleural effusion.  No focal infiltrate is seen. Old rib fractures are noted on the right. IMPRESSION: Small left pleural effusion. Old rib fractures on the right. Electronically Signed   By: Alcide Clever M.D.   On: 12/11/2018 10:04   Dg Chest Port 1 View  Result Date: 12/08/2018 CLINICAL DATA:  Shortness of breath EXAM: PORTABLE CHEST 1 VIEW COMPARISON:  None. FINDINGS: There are small pleural effusions bilaterally with bibasilar atelectasis. There is no edema or consolidation. Heart is upper normal in size with pulmonary vascularity normal. No adenopathy. There old healed rib fractures on the right. IMPRESSION: Small pleural effusions with bibasilar atelectasis. No frank edema or consolidation. Heart upper normal in size. Electronically Signed   By: Bretta Bang III M.D.   On: 12/08/2018 11:17       Today   Subjective:   Caleb Fleming patient doing better shortness of breath improved no chest pain Objective:   Blood pressure 108/84, pulse (!) 108, temperature 98.4 F (36.9 C), resp. rate 16, height  (1.676 m), weight 67.2 kg, SpO2 95 %.  .  Intake/Output Summary (Last 24 hours) at 12/12/2018 1537 Last data filed at 12/12/2018 0342 Gross per 24 hour  Intake -  Output 1165 ml  Net -1165 ml    Exam VITAL SIGNS: Blood pressure 108/84, pulse (!) 108, temperature 98.4 F (36.9 C), resp. rate 16, height  (1.676 m), weight 67.2 kg, SpO2 95 %.  GENERAL:  85 y.o.-year-old patient lying in the bed with no acute distress.  EYES: Pupils equal, round, reactive to light and accommodation. No scleral icterus. Extraocular muscles intact.  HEENT: Head atraumatic, normocephalic. Oropharynx and nasopharynx clear.  NECK:  Supple, no jugular venous distention. No thyroid enlargement, no tenderness.  LUNGS: Normal breath sounds bilaterally, no wheezing, rales,rhonchi or crepitation. No use of accessory muscles of respiration.  CARDIOVASCULAR:  S1, S2 normal. No murmurs, rubs, or gallops.  ABDOMEN: Soft, nontender, nondistended. Bowel sounds present. No organomegaly or mass.  EXTREMITIES: No pedal edema, cyanosis, or clubbing.  NEUROLOGIC: Cranial nerves II through XII are intact. Muscle strength 5/5 in all extremities. Sensation intact. Gait not checked.  PSYCHIATRIC: The patient is alert and oriented x 3.  SKIN: No obvious rash, lesion, or ulcer.   Data Review     CBC w Diff:  Lab Results  Component Value Date   WBC 9.0 12/09/2018   HGB 13.5 12/09/2018   HCT 41.4 12/09/2018   PLT 217 12/09/2018   LYMPHOPCT 11 12/08/2018   MONOPCT 9 12/08/2018   EOSPCT 2 12/08/2018   BASOPCT 0 12/08/2018   CMP:  Lab Results  Component Value Date   NA 136 12/12/2018   K 3.2 (L) 12/12/2018   CL 101 12/12/2018   CO2 24 12/12/2018   BUN 53 (H) 12/12/2018   CREATININE 1.69 (H) 12/12/2018   PROT 7.0 12/08/2018   ALBUMIN 4.1 12/08/2018   BILITOT 1.1 12/08/2018   ALKPHOS 53 12/08/2018   AST 24 12/08/2018   ALT 18 12/08/2018  .  Micro Results Recent Results (from the past 240 hour(s))  SARS Coronavirus 2 (CEPHEID - Performed in [facility] hospital lab), Hosp Order     Status: None   Collection Time: 12/08/18 12:14 PM  Result Value Ref Range Status   SARS Coronavirus 2 NEGATIVE NEGATIVE Final    Comment: (NOTE) If result is NEGATIVE SARS-CoV-2 target nucleic acids are NOT DETECTED. The SARS-CoV-2 RNA is generally detectable in upper and lower  respiratory specimens during the  acute phase of infection. The lowest  concentration of SARS-CoV-2 viral copies this assay can detect is 250  copies / mL. A negative result does not preclude SARS-CoV-2 infection  and should not be used as the sole basis for treatment or other  patient management decisions.  A negative result may occur with  improper specimen collection / handling, submission of specimen other  than nasopharyngeal swab, presence of viral mutation(s) within the  areas  targeted by this assay, and inadequate number of viral copies  (<250 copies / mL). A negative result must be combined with clinical  observations, patient history, and epidemiological information. If result is POSITIVE SARS-CoV-2 target nucleic acids are DETECTED. The SARS-CoV-2 RNA is generally detectable in upper and lower  respiratory specimens dur ing the acute phase of infection.  Positive  results are indicative of active infection with SARS-CoV-2.  Clinical  correlation with patient history and other diagnostic information is  necessary to determine patient infection status.  Positive results do  not rule out bacterial infection or co-infection with other viruses. If result is PRESUMPTIVE POSTIVE SARS-CoV-2 nucleic acids MAY BE PRESENT.   A presumptive positive result was obtained on the submitted specimen  and confirmed on repeat testing.  While 2019 novel coronavirus  (SARS-CoV-2) nucleic acids may be present in the submitted sample  additional confirmatory testing may be necessary for epidemiological  and / or clinical management purposes  to differentiate between  SARS-CoV-2 and other Sarbecovirus currently known to infect humans.  If clinically indicated additional testing with an alternate test  methodology [phone]) is advised. The SARS-CoV-2 RNA is generally  detectable in upper and lower respiratory sp ecimens during the acute  phase of infection. The expected result is Negative. Fact Sheet for Patients:  BoilerBrush.com.cy Fact Sheet for Healthcare Providers: https://pope.com/ This test is not yet approved or cleared by the [place] FDA and has been authorized for detection and/or diagnosis of SARS-CoV-2 by FDA under an Emergency Use Authorization (EUA).  This EUA will remain in effect (meaning this test can be used) for the duration of the COVID-19 declaration under Section 564(b)(1) of the Act, 21 U.S.C. section  360bbb-3(b)(1), unless the authorization is terminated or revoked sooner. Performed at [facility], [address] [place], [place] [address]         Code Status Orders  (From admission, onward)         Start     Ordered   12/08/18 1320  Do not attempt resuscitation (DNR)  Continuous    Question Answer Comment  In the event of cardiac or respiratory ARREST Do not call a "code blue"   In the event of cardiac or respiratory ARREST Do not perform Intubation, CPR, defibrillation or ACLS   In the event of cardiac or respiratory ARREST Use medication by any route, position, wound care, and other measures to relive pain and suffering. May use oxygen, suction and manual treatment of airway obstruction as needed for comfort.      12/08/18 1323        Code Status History    This patient has a current code status but no historical code status.          Follow-up Information    Steele Sizer, MD On 12/19/2018.   Specialty:  Family Medicine Why:  appointment at [facility] information: [address] [place] [address] [phone]        Marcina Millard, MD On 12/22/2018.  Specialty:  Cardiology Why:  appointment at 11am Contact information: [address] [facility] [place] [address] [phone]           Discharge Medications   Allergies as of 12/12/2018      Reactions   Bactrim [sulfamethoxazole-trimethoprim] Itching   Codeine       Medication List    STOP taking these medications   amLODipine 5 MG tablet Commonly known as:  NORVASC   aspirin 81 MG tablet   atenolol 25 MG tablet Commonly known as:  TENORMIN   citalopram 20 MG tablet Commonly known as:  CELEXA     TAKE these medications   acetaminophen 325 MG tablet Commonly known as:  TYLENOL Take 650 mg by mouth every 4 (four) hours as needed.   amiodarone 200 MG tablet Commonly known as:  PACERONE Take 1 tablet (200 mg total) by mouth  daily. Start taking on:  December 13, 2018   apixaban 5 MG Tabs tablet Commonly known as:  ELIQUIS Take 1 tablet (5 mg total) by mouth 2 (two) times daily.   ciprofloxacin 250 MG tablet Commonly known as:  CIPRO Take 1 tablet (250 mg total) by mouth 2 (two) times daily for 3 days.   ferrous gluconate 324 MG tablet Commonly known as:  FERGON Take 324 mg by mouth daily with breakfast.   Fluticasone-Salmeterol 250-50 MCG/DOSE Aepb Commonly known as:  Advair Diskus Inhale 1 puff into the lungs 2 (two) times daily.   furosemide 40 MG tablet Commonly known as:  Lasix Take 1 tablet (40 mg total) by mouth 2 (two) times daily.   Ipratropium-Albuterol 20-100 MCG/ACT Aers respimat Commonly known as:  Combivent Respimat Inhale 1 puff into the lungs every 6 (six) hours as needed for wheezing.   metoprolol succinate 25 MG 24 hr tablet Commonly known as:  TOPROL-XL Take 1 tablet (25 mg total) by mouth daily. Start taking on:  December 13, 2018   Omega-3 1000 MG Caps Take 2 g by mouth daily.   potassium chloride 10 MEQ tablet Commonly known as:  K-DUR Take 1 tablet (10 mEq total) by mouth daily.   predniSONE 10 MG (21) Tbpk tablet Commonly known as:  STERAPRED UNI-PAK 21 TAB Start at 60mg  taper by 10mg  until complete   simvastatin 20 MG tablet Commonly known as:  ZOCOR Take 1 tablet (20 mg total) by mouth daily at 6 PM. What changed:  See the new instructions.   traMADol 50 MG tablet Commonly known as:  ULTRAM Take 1 tablet (50 mg total) by mouth every 6 (six) hours as needed for up to 5 days for moderate pain.   Tumersaid Tabs Take 1 tablet by mouth daily.   vitamin C 500 MG tablet Commonly known as:  ASCORBIC ACID Take 500 mg by mouth daily.   Vitamin D3 25 MCG (1000 UT) Caps Take 1,000 Units by mouth daily.          Total Time in preparing paper work, data evaluation and todays exam - 35 minutes  Auburn BilberryShreyang Manas Hickling M.D on 12/12/2018 at 3:37 PM [facility]   Office   [phone]

## 2018-12-12 NOTE — Plan of Care (Signed)
Problem: Health Behavior/Discharge Planning: Goal: Ability to manage health-related needs will improve 12/12/2018 1458 by Weldon Picking, Manuella Ghazi, RN Outcome: Adequate for Discharge 12/12/2018 1402 by Weldon Picking, Manuella Ghazi, RN Outcome: Adequate for Discharge   Problem: Clinical Measurements: Goal: Ability to maintain clinical measurements within normal limits will improve 12/12/2018 1458 by Erma Heritage, RN Outcome: Adequate for Discharge 12/12/2018 1402 by Weldon Picking, Manuella Ghazi, RN Outcome: Adequate for Discharge Goal: Will remain free from infection 12/12/2018 1458 by Erma Heritage, RN Outcome: Adequate for Discharge 12/12/2018 1402 by Weldon Picking, Manuella Ghazi, RN Outcome: Adequate for Discharge Goal: Diagnostic test results will improve 12/12/2018 1458 by Erma Heritage, RN Outcome: Adequate for Discharge 12/12/2018 1402 by Weldon Picking, Manuella Ghazi, RN Outcome: Adequate for Discharge Goal: Respiratory complications will improve 12/12/2018 1458 by Erma Heritage, RN Outcome: Adequate for Discharge 12/12/2018 1402 by Weldon Picking, Manuella Ghazi, RN Outcome: Adequate for Discharge Goal: Cardiovascular complication will be avoided 12/12/2018 1458 by Erma Heritage, RN Outcome: Adequate for Discharge 12/12/2018 1402 by Erma Heritage, RN Outcome: Adequate for Discharge   Problem: Activity: Goal: Risk for activity intolerance will decrease 12/12/2018 1458 by Weldon Picking, Manuella Ghazi, RN Outcome: Adequate for Discharge 12/12/2018 1402 by Weldon Picking, Manuella Ghazi, RN Outcome: Adequate for Discharge   Problem: Coping: Goal: Level of anxiety will decrease 12/12/2018 1458 by Weldon Picking, Manuella Ghazi, RN Outcome: Adequate for Discharge 12/12/2018 1402 by Weldon Picking, Manuella Ghazi, RN Outcome: Adequate for Discharge   Problem: Elimination: Goal: Will not experience complications related to bowel motility 12/12/2018 1458 by Erma Heritage, RN Outcome: Adequate for Discharge 12/12/2018 1402 by Weldon Picking, Manuella Ghazi, RN Outcome: Adequate for Discharge Goal: Will not experience complications related to urinary retention 12/12/2018 1458 by Erma Heritage, RN Outcome: Adequate for Discharge 12/12/2018 1402 by Weldon Picking, Manuella Ghazi, RN Outcome: Adequate for Discharge   Problem: Pain Managment: Goal: General experience of comfort will improve 12/12/2018 1458 by Erma Heritage, RN Outcome: Adequate for Discharge 12/12/2018 1402 by Weldon Picking, Manuella Ghazi, RN Outcome: Adequate for Discharge   Problem: Safety: Goal: Ability to remain free from injury will improve 12/12/2018 1458 by Weldon Picking, Manuella Ghazi, RN Outcome: Adequate for Discharge 12/12/2018 1402 by Weldon Picking, Manuella Ghazi, RN Outcome: Adequate for Discharge   Problem: Skin Integrity: Goal: Risk for impaired skin integrity will decrease 12/12/2018 1458 by Weldon Picking, Manuella Ghazi, RN Outcome: Adequate for Discharge 12/12/2018 1402 by Weldon Picking, Manuella Ghazi, RN Outcome: Adequate for Discharge   Problem: Education: Goal: Knowledge of disease or condition will improve 12/12/2018 1458 by Erma Heritage, RN Outcome: Adequate for Discharge 12/12/2018 1402 by Weldon Picking, Manuella Ghazi, RN Outcome: Adequate for Discharge Goal: Understanding of medication regimen will improve 12/12/2018 1458 by Erma Heritage, RN Outcome: Adequate for Discharge 12/12/2018 1402 by Erma Heritage, RN Outcome: Adequate for Discharge Goal: Individualized Educational Video(s) 12/12/2018 1458 by Erma Heritage, RN Outcome: Adequate for Discharge 12/12/2018 1402 by Erma Heritage, RN Outcome: Adequate for Discharge   Problem: Activity: Goal: Ability to tolerate increased activity will improve 12/12/2018 1458 by Weldon Picking, Manuella Ghazi, RN Outcome: Adequate for Discharge 12/12/2018 1402 by Weldon Picking, Manuella Ghazi, RN Outcome:  Adequate for Discharge   Problem: Cardiac: Goal: Ability to achieve and maintain adequate cardiopulmonary perfusion will improve 12/12/2018 1458 by Erma Heritage, RN Outcome: Adequate for Discharge 12/12/2018 1402 by Weldon Picking, Manuella Ghazi, RN Outcome: Adequate for Discharge   Problem: Health Behavior/Discharge Planning: Goal: Ability to safely manage health-related needs after discharge will improve 12/12/2018 1458 by  Erma HeritageAlejo Calderon, Alyxander Kollmann, RN Outcome: Adequate for Discharge 12/12/2018 1402 by Weldon PickingAlejo Calderon, Manuella GhaziBerenice, RN Outcome: Adequate for Discharge   Problem: Education: Goal: Ability to demonstrate management of disease process will improve 12/12/2018 1458 by Erma HeritageAlejo Calderon, Keilany Burnette, RN Outcome: Adequate for Discharge 12/12/2018 1402 by Weldon PickingAlejo Calderon, Manuella GhaziBerenice, RN Outcome: Adequate for Discharge Goal: Ability to verbalize understanding of medication therapies will improve 12/12/2018 1458 by Erma HeritageAlejo Calderon, Dusan Lipford, RN Outcome: Adequate for Discharge 12/12/2018 1402 by Erma HeritageAlejo Calderon, Jennilee Demarco, RN Outcome: Adequate for Discharge Goal: Individualized Educational Video(s) 12/12/2018 1458 by Erma HeritageAlejo Calderon, Juergen Hardenbrook, RN Outcome: Adequate for Discharge 12/12/2018 1402 by Erma HeritageAlejo Calderon, Loyd Salvador, RN Outcome: Adequate for Discharge   Problem: Activity: Goal: Capacity to carry out activities will improve 12/12/2018 1458 by Weldon PickingAlejo Calderon, Manuella GhaziBerenice, RN Outcome: Adequate for Discharge 12/12/2018 1402 by Weldon PickingAlejo Calderon, Manuella GhaziBerenice, RN Outcome: Adequate for Discharge   Problem: Cardiac: Goal: Ability to achieve and maintain adequate cardiopulmonary perfusion will improve 12/12/2018 1458 by Erma HeritageAlejo Calderon, Ab Leaming, RN Outcome: Adequate for Discharge 12/12/2018 1402 by Erma HeritageAlejo Calderon, Rudolfo Brandow, RN Outcome: Adequate for Discharge

## 2018-12-12 NOTE — Plan of Care (Signed)
  Problem: Health Behavior/Discharge Planning: Goal: Ability to manage health-related needs will improve Outcome: Progressing   Problem: Activity: Goal: Risk for activity intolerance will decrease Outcome: Progressing   

## 2018-12-19 ENCOUNTER — Ambulatory Visit (INDEPENDENT_AMBULATORY_CARE_PROVIDER_SITE_OTHER): Payer: Medicare Other | Admitting: Family Medicine

## 2018-12-19 ENCOUNTER — Encounter: Payer: Self-pay | Admitting: Family Medicine

## 2018-12-19 ENCOUNTER — Other Ambulatory Visit: Payer: Self-pay

## 2018-12-19 VITALS — BP 116/70 | HR 88 | Temp 98.3°F | Wt 145.0 lb

## 2018-12-19 DIAGNOSIS — F32A Depression, unspecified: Secondary | ICD-10-CM

## 2018-12-19 DIAGNOSIS — E782 Mixed hyperlipidemia: Secondary | ICD-10-CM | POA: Diagnosis not present

## 2018-12-19 DIAGNOSIS — F419 Anxiety disorder, unspecified: Secondary | ICD-10-CM

## 2018-12-19 DIAGNOSIS — Z7689 Persons encountering health services in other specified circumstances: Secondary | ICD-10-CM

## 2018-12-19 DIAGNOSIS — I10 Essential (primary) hypertension: Secondary | ICD-10-CM

## 2018-12-19 DIAGNOSIS — G4700 Insomnia, unspecified: Secondary | ICD-10-CM | POA: Diagnosis not present

## 2018-12-19 DIAGNOSIS — I4891 Unspecified atrial fibrillation: Secondary | ICD-10-CM | POA: Diagnosis not present

## 2018-12-19 DIAGNOSIS — F329 Major depressive disorder, single episode, unspecified: Secondary | ICD-10-CM

## 2018-12-19 DIAGNOSIS — R0602 Shortness of breath: Secondary | ICD-10-CM

## 2018-12-19 MED ORDER — SERTRALINE HCL 50 MG PO TABS: 50.0000 mg | ORAL_TABLET | Freq: Every day | ORAL | 0 refills | Status: DC

## 2018-12-19 MED ORDER — SIMVASTATIN 20 MG PO TABS: 20.0000 mg | ORAL_TABLET | Freq: Every day | ORAL | 1 refills | Status: DC

## 2018-12-19 MED ORDER — BELSOMRA 5 MG PO TABS: 5.0000 mg | ORAL_TABLET | Freq: Every evening | ORAL | 0 refills | Status: DC | PRN

## 2018-12-19 NOTE — Progress Notes (Signed)
BP 116/70   Pulse 88   Temp 98.3 F (36.8 C) (Oral)   Wt 145 lb (65.8 kg)   SpO2 97%   BMI 23.40 kg/m    Subjective:    Patient ID: Caleb ShiverErnest E Guadalupe Jr., male    DOB: [birth year], 85 y.o.   MRN: [id number]  HPI: Caleb Shiverrnest E Cornman Jr. is a 85 y.o. male  Chief Complaint  Patient presents with  . New Patient (Initial Visit)    Reestablish care after recent hospitalization.    Here today to re-establish care after being lost to follow up for 4 or so years.   Recently hospitalized for atrial fibrillation with RVR. Was placed on metoprolol, eliquis, and amiodarone which has controlled HR and sxs very well. States he feels excellent since being back home - no CP, SOB, palpitations, dizziness. Was also found to be in acute HF, for which he was put on lasix in addition to the other medications. Has appt with Cardiology on Wednesday to establish care.   HTN - has not been checking home BPs or HRs. Denies side effects to the new medications he's been placed on. Following up with Cardiology in several days.   HLD - just placed back on simvastatin. Tolerating well. Denies claudication, myalgias.   Was also found to have possible COPD during admission and placed on spiriva and advair, which he feels are helping with his SOB as well.   Hx of depression and insomnia that have been chronic and lifelong. Was previously on zoloft and ambien which helped quite a bit. Was taken off ambien by PCP in the past due to risks with his age group but wishes to be on something for sleep. Denies SI/HI.   Last CPE was at least 4 years ago.   Relevant past medical, surgical, family and social history reviewed and updated as indicated. Interim medical history since our last visit reviewed. Allergies and medications reviewed and updated.  Review of Systems  Per HPI unless specifically indicated above     Objective:    BP 116/70   Pulse 88   Temp 98.3 F (36.8 C) (Oral)   Wt 145 lb (65.8 kg)   SpO2  97%   BMI 23.40 kg/m   Wt Readings from Last 3 Encounters:  12/19/18 145 lb (65.8 kg)  12/12/18 148 lb 3.2 oz (67.2 kg)  12/01/14 166 lb (75.3 kg)    Physical Exam Vitals signs and nursing note reviewed.  Constitutional:      Appearance: Normal appearance.  HENT:     Head: Atraumatic.  Eyes:     Extraocular Movements: Extraocular movements intact.     Conjunctiva/sclera: Conjunctivae normal.  Neck:     Musculoskeletal: Normal range of motion and neck supple.  Cardiovascular:     Rate and Rhythm: Normal rate.  Pulmonary:     Effort: Pulmonary effort is normal. No respiratory distress.     Breath sounds: Normal breath sounds.  Musculoskeletal: Normal range of motion.  Skin:    General: Skin is warm and dry.  Neurological:     General: No focal deficit present.     Mental Status: He is oriented to person, place, and time.  Psychiatric:        Mood and Affect: Mood normal.        Thought Content: Thought content normal.        Judgment: Judgment normal.     Results for orders placed or performed in visit on 12/19/18  Lipid Panel w/o Chol/HDL Ratio  Result Value Ref Range   Cholesterol, Total 166 100 - 199 mg/dL   Triglycerides 100 0 - 149 mg/dL   HDL 89 >39 mg/dL   VLDL Cholesterol Cal 20 5 - 40 mg/dL   LDL Calculated 57 0 - 99 mg/dL      Assessment & Plan:   Problem List Items Addressed This Visit      Cardiovascular and Mediastinum   Hypertension    Stable and WNL on current regimen, continue and await Cardiology establish care visit      Relevant Medications   simvastatin (ZOCOR) 20 MG tablet   Atrial fibrillation with rapid ventricular response (HCC) - Primary    Rate and sxs controlled well on this regimen. Continue current regimen and f/u with Cardiology      Relevant Medications   simvastatin (ZOCOR) 20 MG tablet     Other   Hyperlipidemia    Recheck lipids, continue simvastatin      Relevant Medications   simvastatin (ZOCOR) 20 MG tablet    Other Relevant Orders   Lipid Panel w/o Chol/HDL Ratio (Completed)   Insomnia    Will start belsomra for chronic insomnia. Sleep hygiene reviewed additionally      Anxiety and depression    Restart zoloft as he's done very well in the past with this. Monitor for benefit      Relevant Medications   sertraline (ZOLOFT) 50 MG tablet    Other Visit Diagnoses    Encounter to establish care       SOB (shortness of breath)       Stable on present medications and inhalers, needs spirometry testing at upcoming visit given possible COPD dx       Follow up plan: Return in about 4 weeks (around 01/16/2019) for sleep, mood f/u.

## 2018-12-20 LAB — LIPID PANEL W/O CHOL/HDL RATIO
Cholesterol, Total: 166 mg/dL (ref 100–199)
HDL: 89 mg/dL
LDL Calculated: 57 mg/dL (ref 0–99)
Triglycerides: 100 mg/dL (ref 0–149)
VLDL Cholesterol Cal: 20 mg/dL (ref 5–40)

## 2018-12-22 ENCOUNTER — Encounter: Payer: Self-pay | Admitting: Family Medicine

## 2018-12-23 NOTE — Assessment & Plan Note (Signed)
Restart zoloft as he's done very well in the past with this. Monitor for benefit

## 2018-12-23 NOTE — Assessment & Plan Note (Signed)
Recheck lipids, continue simvastatin

## 2018-12-23 NOTE — Assessment & Plan Note (Signed)
Will start belsomra for chronic insomnia. Sleep hygiene reviewed additionally

## 2018-12-23 NOTE — Assessment & Plan Note (Signed)
Stable and WNL on current regimen, continue and await Cardiology establish care visit

## 2018-12-23 NOTE — Assessment & Plan Note (Signed)
Rate and sxs controlled well on this regimen. Continue current regimen and f/u with Cardiology

## 2018-12-24 DIAGNOSIS — E785 Hyperlipidemia, unspecified: Secondary | ICD-10-CM | POA: Diagnosis not present

## 2018-12-24 DIAGNOSIS — I10 Essential (primary) hypertension: Secondary | ICD-10-CM | POA: Diagnosis not present

## 2018-12-24 DIAGNOSIS — Z79899 Other long term (current) drug therapy: Secondary | ICD-10-CM | POA: Diagnosis not present

## 2018-12-24 DIAGNOSIS — I480 Paroxysmal atrial fibrillation: Secondary | ICD-10-CM | POA: Diagnosis not present

## 2018-12-25 DIAGNOSIS — F1010 Alcohol abuse, uncomplicated: Secondary | ICD-10-CM | POA: Insufficient documentation

## 2018-12-26 ENCOUNTER — Inpatient Hospital Stay: Payer: Medicare Other | Admitting: Family Medicine

## 2018-12-31 DIAGNOSIS — Z79899 Other long term (current) drug therapy: Secondary | ICD-10-CM | POA: Diagnosis not present

## 2019-01-01 ENCOUNTER — Telehealth: Payer: Self-pay | Admitting: Family Medicine

## 2019-01-01 NOTE — Telephone Encounter (Signed)
Patients daughter in law notified.

## 2019-01-01 NOTE — Telephone Encounter (Signed)
Will need to discuss this with his Cardiologist  Copied from [place] [id number]. Topic: General - Other >> Jan 01, 2019  8:43 AM Carolyn Stare wrote: Pt daughter in law cal lto sau when pt was in the hospital his cardiologist decrease the following med to 1 pill a day and now he is having some swelling and is asking if it is ok to go back on 2 pilss   furosemide (LASIX) 40 MG tablet

## 2019-01-05 DIAGNOSIS — I10 Essential (primary) hypertension: Secondary | ICD-10-CM | POA: Diagnosis not present

## 2019-01-05 DIAGNOSIS — I4891 Unspecified atrial fibrillation: Secondary | ICD-10-CM | POA: Diagnosis not present

## 2019-01-05 DIAGNOSIS — I4819 Other persistent atrial fibrillation: Secondary | ICD-10-CM | POA: Diagnosis not present

## 2019-01-05 DIAGNOSIS — E785 Hyperlipidemia, unspecified: Secondary | ICD-10-CM | POA: Diagnosis not present

## 2019-01-11 ENCOUNTER — Other Ambulatory Visit: Payer: Self-pay | Admitting: Family Medicine

## 2019-01-11 NOTE — Telephone Encounter (Signed)
Requested Prescriptions  Pending Prescriptions Disp Refills  . sertraline (ZOLOFT) 50 MG tablet [Pharmacy Med Name: SERTRALINE HCL 50 MG TABLET] 30 tablet 0    Sig: TAKE 1 TABLET BY MOUTH EVERY DAY     Psychiatry:  Antidepressants - SSRI Passed - 01/11/2019  9:19 AM      Passed - Valid encounter within last 6 months    Recent Outpatient Visits          3 weeks ago Atrial fibrillation with rapid ventricular response [facility])   [facility], [place]      Future Appointments            In 1 week Orene Desanctis, Lilia Argue, PA-C [facility], [place] - Completed PHQ-2 or PHQ-9 in the last 360 days.

## 2019-01-22 ENCOUNTER — Ambulatory Visit (INDEPENDENT_AMBULATORY_CARE_PROVIDER_SITE_OTHER): Payer: Medicare Other | Admitting: Family Medicine

## 2019-01-22 ENCOUNTER — Encounter: Payer: Self-pay | Admitting: Family Medicine

## 2019-01-22 ENCOUNTER — Other Ambulatory Visit: Payer: Self-pay

## 2019-01-22 VITALS — BP 128/91 | HR 102 | Temp 98.3°F

## 2019-01-22 DIAGNOSIS — G4700 Insomnia, unspecified: Secondary | ICD-10-CM

## 2019-01-22 DIAGNOSIS — F419 Anxiety disorder, unspecified: Secondary | ICD-10-CM

## 2019-01-22 DIAGNOSIS — I4891 Unspecified atrial fibrillation: Secondary | ICD-10-CM | POA: Diagnosis not present

## 2019-01-22 DIAGNOSIS — I10 Essential (primary) hypertension: Secondary | ICD-10-CM

## 2019-01-22 DIAGNOSIS — F329 Major depressive disorder, single episode, unspecified: Secondary | ICD-10-CM

## 2019-01-22 MED ORDER — POTASSIUM CHLORIDE ER 10 MEQ PO TBCR: 10.0000 meq | EXTENDED_RELEASE_TABLET | Freq: Every day | ORAL | 0 refills | Status: DC

## 2019-01-22 MED ORDER — AMIODARONE HCL 200 MG PO TABS: 200.0000 mg | ORAL_TABLET | Freq: Every day | ORAL | 0 refills | Status: DC

## 2019-01-22 MED ORDER — METOPROLOL SUCCINATE ER 25 MG PO TB24: 25.0000 mg | ORAL_TABLET | Freq: Every day | ORAL | 0 refills | Status: DC

## 2019-01-22 MED ORDER — APIXABAN 5 MG PO TABS: 5.0000 mg | ORAL_TABLET | Freq: Two times a day (BID) | ORAL | 0 refills | Status: DC

## 2019-01-22 NOTE — Progress Notes (Signed)
BP (!) 128/91   Pulse (!) 102   Temp 98.3 F (36.8 C) (Oral)   SpO2 100%    Subjective:    Patient ID: Caleb ShiverErnest E Savitz Jr., male    DOB: 07-23-1931, 85 y.o.   MRN: [id number]  HPI: Caleb Shiverrnest E Fabela Jr. is a 85 y.o. male  Chief Complaint  Patient presents with  . Depression  . Insomnia   Here today for 1 month mood and insomnia f/u. Not sleeping well, the belsomra didn't seem to help so he stopped taking it. Has been taking the zoloft daily but doesn't seem to feel any different, wanting to stop the medication. States he doesn't want to continue trying other medications at this time, has always been this way and will just continue to be this way. Denies SI/HI, significant mood swings, frequent panic attacks.   Relevant past medical, surgical, family and social history reviewed and updated as indicated. Interim medical history since our last visit reviewed. Allergies and medications reviewed and updated.  Review of Systems  Per HPI unless specifically indicated above     Objective:    BP (!) 128/91   Pulse (!) 102   Temp 98.3 F (36.8 C) (Oral)   SpO2 100%   Wt Readings from Last 3 Encounters:  12/19/18 145 lb (65.8 kg)  12/12/18 148 lb 3.2 oz (67.2 kg)  12/01/14 166 lb (75.3 kg)    Physical Exam Vitals signs and nursing note reviewed.  Constitutional:      Appearance: Normal appearance.  HENT:     Head: Atraumatic.  Eyes:     Extraocular Movements: Extraocular movements intact.     Conjunctiva/sclera: Conjunctivae normal.  Neck:     Musculoskeletal: Normal range of motion and neck supple.  Cardiovascular:     Rate and Rhythm: Normal rate.  Pulmonary:     Effort: Pulmonary effort is normal.     Breath sounds: Normal breath sounds.  Musculoskeletal: Normal range of motion.  Skin:    General: Skin is warm and dry.  Neurological:     General: No focal deficit present.     Motor: No weakness.     Gait: Gait normal.  Psychiatric:        Mood and Affect:  Mood normal.        Thought Content: Thought content normal.        Judgment: Judgment normal.     Results for orders placed or performed in visit on 12/19/18  Lipid Panel w/o Chol/HDL Ratio  Result Value Ref Range   Cholesterol, Total 166 100 - 199 mg/dL   Triglycerides 409100 0 - 149 mg/dL   HDL 89 >81>39 mg/dL   VLDL Cholesterol Cal 20 5 - 40 mg/dL   LDL Calculated 57 0 - 99 mg/dL      Assessment & Plan:   Problem List Items Addressed This Visit      Cardiovascular and Mediastinum   Hypertension - Primary   Relevant Medications   metoprolol succinate (TOPROL-XL) 25 MG 24 hr tablet   apixaban (ELIQUIS) 5 MG TABS tablet   amiodarone (PACERONE) 200 MG tablet   Atrial fibrillation with rapid ventricular response (HCC)   Relevant Medications   metoprolol succinate (TOPROL-XL) 25 MG 24 hr tablet   apixaban (ELIQUIS) 5 MG TABS tablet   amiodarone (PACERONE) 200 MG tablet     Other   Insomnia    Ongoing issue for many years. No improvement on belsomra, declines increasing dose or  trying a different medication at this time. Will continue working on sleep hygiene.       Anxiety and depression    Did not find benefit in re-addition of zoloft. Requesting to be off of it. Taper schedule reviewed          Follow up plan: Return in about 6 months (around 07/25/2019) for 6 month f/u.

## 2019-01-26 NOTE — Assessment & Plan Note (Signed)
Ongoing issue for many years. No improvement on belsomra, declines increasing dose or trying a different medication at this time. Will continue working on sleep hygiene.

## 2019-01-26 NOTE — Assessment & Plan Note (Signed)
Did not find benefit in re-addition of zoloft. Requesting to be off of it. Taper schedule reviewed

## 2019-02-04 ENCOUNTER — Emergency Department: Payer: Medicare Other

## 2019-02-04 ENCOUNTER — Emergency Department
Admission: EM | Admit: 2019-02-04 | Discharge: 2019-02-04 | Disposition: A | Discharge status: Home / Self Care | Payer: Medicare Other | Attending: Emergency Medicine | Admitting: Emergency Medicine

## 2019-02-04 ENCOUNTER — Other Ambulatory Visit: Payer: Self-pay

## 2019-02-04 ENCOUNTER — Encounter: Payer: Self-pay | Admitting: *Deleted

## 2019-02-04 DIAGNOSIS — I10 Essential (primary) hypertension: Secondary | ICD-10-CM | POA: Diagnosis not present

## 2019-02-04 DIAGNOSIS — M79671 Pain in right foot: Secondary | ICD-10-CM | POA: Insufficient documentation

## 2019-02-04 DIAGNOSIS — Y939 Activity, unspecified: Secondary | ICD-10-CM | POA: Diagnosis not present

## 2019-02-04 DIAGNOSIS — Z87891 Personal history of nicotine dependence: Secondary | ICD-10-CM | POA: Diagnosis not present

## 2019-02-04 DIAGNOSIS — Y999 Unspecified external cause status: Secondary | ICD-10-CM | POA: Diagnosis not present

## 2019-02-04 DIAGNOSIS — Y29XXXA Contact with blunt object, undetermined intent, initial encounter: Secondary | ICD-10-CM | POA: Diagnosis not present

## 2019-02-04 DIAGNOSIS — Z7901 Long term (current) use of anticoagulants: Secondary | ICD-10-CM | POA: Diagnosis not present

## 2019-02-04 DIAGNOSIS — I4891 Unspecified atrial fibrillation: Secondary | ICD-10-CM | POA: Insufficient documentation

## 2019-02-04 DIAGNOSIS — L03119 Cellulitis of unspecified part of limb: Secondary | ICD-10-CM

## 2019-02-04 DIAGNOSIS — Y929 Unspecified place or not applicable: Secondary | ICD-10-CM | POA: Diagnosis not present

## 2019-02-04 DIAGNOSIS — Z79899 Other long term (current) drug therapy: Secondary | ICD-10-CM | POA: Diagnosis not present

## 2019-02-04 DIAGNOSIS — R600 Localized edema: Secondary | ICD-10-CM | POA: Diagnosis not present

## 2019-02-04 DIAGNOSIS — L03115 Cellulitis of right lower limb: Secondary | ICD-10-CM | POA: Diagnosis not present

## 2019-02-04 LAB — CBC WITH DIFFERENTIAL/PLATELET
Abs Immature Granulocytes: 0.08 10*3/uL — ABNORMAL HIGH (ref 0.00–0.07)
Basophils Absolute: 0.1 10*3/uL (ref 0.0–0.1)
Basophils Relative: 0 %
Eosinophils Absolute: 0 10*3/uL (ref 0.0–0.5)
Eosinophils Relative: 0 %
HCT: 41.9 % (ref 39.0–52.0)
Hemoglobin: 14.6 g/dL (ref 13.0–17.0)
Immature Granulocytes: 1 %
Lymphocytes Relative: 7 %
Lymphs Abs: 1 10*3/uL (ref 0.7–4.0)
MCH: 31.2 pg (ref 26.0–34.0)
MCHC: 34.8 g/dL (ref 30.0–36.0)
MCV: 89.5 fL (ref 80.0–100.0)
Monocytes Absolute: 1.5 10*3/uL — ABNORMAL HIGH (ref 0.1–1.0)
Monocytes Relative: 10 %
Neutro Abs: 12.2 10*3/uL — ABNORMAL HIGH (ref 1.7–7.7)
Neutrophils Relative %: 82 %
Platelets: 286 10*3/uL (ref 150–400)
RBC: 4.68 MIL/uL (ref 4.22–5.81)
RDW: 13.3 % (ref 11.5–15.5)
WBC: 14.9 10*3/uL — ABNORMAL HIGH (ref 4.0–10.5)
nRBC: 0 % (ref 0.0–0.2)

## 2019-02-04 LAB — COMPREHENSIVE METABOLIC PANEL
ALT: 15 U/L (ref 0–44)
AST: 16 U/L (ref 15–41)
Albumin: 3.9 g/dL (ref 3.5–5.0)
Alkaline Phosphatase: 44 U/L (ref 38–126)
Anion gap: 13 (ref 5–15)
BUN: 22 mg/dL (ref 8–23)
CO2: 26 mmol/L (ref 22–32)
Calcium: 8.7 mg/dL — ABNORMAL LOW (ref 8.9–10.3)
Chloride: 99 mmol/L (ref 98–111)
Creatinine, Ser: 1.3 mg/dL — ABNORMAL HIGH (ref 0.61–1.24)
GFR calc Af Amer: 58 mL/min — ABNORMAL LOW (ref >60)
GFR calc non Af Amer: 50 mL/min — ABNORMAL LOW (ref >60)
Glucose, Bld: 96 mg/dL (ref 70–99)
Potassium: 2.9 mmol/L — ABNORMAL LOW (ref 3.5–5.1)
Sodium: 138 mmol/L (ref 135–145)
Total Bilirubin: 1.2 mg/dL (ref 0.3–1.2)
Total Protein: 7.2 g/dL (ref 6.5–8.1)

## 2019-02-04 LAB — LACTIC ACID, PLASMA: Lactic Acid, Venous: 1.6 mmol/L (ref 0.5–1.9)

## 2019-02-04 MED ORDER — POTASSIUM CHLORIDE 20 MEQ PO PACK
40.0000 meq | PACK | Freq: Once | ORAL | Status: AC
  Administered 2019-02-04: 22:00:00 40 meq via ORAL
  Filled 2019-02-04: qty 2

## 2019-02-04 MED ORDER — POTASSIUM CHLORIDE ER 10 MEQ PO TBCR: 20.0000 meq | EXTENDED_RELEASE_TABLET | Freq: Two times a day (BID) | ORAL | 0 refills | Status: DC

## 2019-02-04 MED ORDER — SODIUM CHLORIDE 0.9 % IV SOLN
1.0000 g | Freq: Once | INTRAVENOUS | Status: AC
  Administered 2019-02-04: 21:00:00 1 g via INTRAVENOUS
  Filled 2019-02-04: qty 10

## 2019-02-04 MED ORDER — DOXYCYCLINE MONOHYDRATE 100 MG PO TABS: 100.0000 mg | ORAL_TABLET | Freq: Two times a day (BID) | ORAL | 0 refills | Status: AC

## 2019-02-04 MED ORDER — CEPHALEXIN 500 MG PO CAPS: 500.0000 mg | ORAL_CAPSULE | Freq: Three times a day (TID) | ORAL | 0 refills | Status: AC

## 2019-02-04 NOTE — ED Notes (Signed)
Ultrasound at bedside

## 2019-02-04 NOTE — ED Triage Notes (Signed)
Pt reporting he accidentally hit his foot on a fan last week and since has had increased pain in right foot. Pain with movement and walking.

## 2019-02-04 NOTE — ED Provider Notes (Signed)
[facility] Emergency Department Provider Note  ____________________________________________  Time seen: Approximately 8:21 PM  I have reviewed the triage vital signs and the nursing notes.   HISTORY  Chief Complaint Foot Injury    HPI Caleb Shiverrnest E Gjerde Jr. is a 85 y.o. male with a history of gout, presents to the emergency department with pain, erythema and edema of the right foot.  Patient has had symptoms for about a week after hitting his foot against a fan.  Patient reports that his gout flares are restricted only to the right great toe and his symptoms currently feel different.  He denies fever and chills.  No prior history of DVT.  Patient does state that right foot pain does radiate into the right calf.  Patient symptoms have prevented him from being as active as he normally is.  No other alleviating measures have been attempted.        Past Medical History:  Diagnosis Date  . Anxiety   . BPH (benign prostatic hypertrophy)   . Depression   . ED (erectile dysfunction)   . Gout   . Hyperlipidemia   . Hypertension   . Insomnia     Patient Active Problem List   Diagnosis Date Noted  . Atrial fibrillation with rapid ventricular response (HCC) 12/08/2018  . BPH (benign prostatic hypertrophy) 05/26/2015  . Gout 05/26/2015  . Hyperlipidemia 05/26/2015  . Insomnia 05/26/2015  . Hypogonadism in male 05/26/2015  . Hypertension 05/26/2015  . Anxiety and depression 05/26/2015  . Cellulitis of left upper extremity 05/06/2015  . Laceration of left forearm 05/06/2015  . Rash 05/06/2015    History reviewed. No pertinent surgical history.  Prior to Admission medications   Medication Sig Start Date End Date Taking? Authorizing Provider  acetaminophen (TYLENOL) 325 MG tablet Take 650 mg by mouth every 4 (four) hours as needed. 05/09/15   [provider]  amiodarone (PACERONE) 200 MG tablet Take 1 tablet (200 mg total) by mouth daily. 01/22/19    Particia NearingLane, Rachel Elizabeth, PA-C  apixaban (ELIQUIS) 5 MG TABS tablet Take 1 tablet (5 mg total) by mouth 2 (two) times daily. 01/22/19   Particia NearingLane, Rachel Elizabeth, PA-C  Cholecalciferol (VITAMIN D3) 25 MCG (1000 UT) CAPS Take 1,000 Units by mouth daily.    [provider]  ferrous gluconate (FERGON) 324 MG tablet Take 324 mg by mouth daily with breakfast.    [provider]  Fluticasone-Salmeterol (ADVAIR DISKUS) 250-50 MCG/DOSE AEPB Inhale 1 puff into the lungs 2 (two) times daily. 12/12/18 12/12/19  Auburn BilberryPatel, Shreyang, MD  furosemide (LASIX) 40 MG tablet Take 1 tablet (40 mg total) by mouth 2 (two) times daily. 12/12/18 12/12/19  Auburn BilberryPatel, Shreyang, MD  Ipratropium-Albuterol (COMBIVENT RESPIMAT) 20-100 MCG/ACT AERS respimat Inhale 1 puff into the lungs every 6 (six) hours as needed for wheezing. 12/12/18   Auburn BilberryPatel, Shreyang, MD  metoprolol succinate (TOPROL-XL) 25 MG 24 hr tablet Take 1 tablet (25 mg total) by mouth daily. 01/22/19   Particia NearingLane, Rachel Elizabeth, PA-C  Misc Natural Products [place](TUMERSAID) TABS Take 1 tablet by mouth daily.    [provider]  Omega-3 1000 MG CAPS Take 2 g by mouth daily.    [provider]  potassium chloride (K-DUR) 10 MEQ tablet Take 1 tablet (10 mEq total) by mouth daily. 01/22/19   Particia NearingLane, Rachel Elizabeth, PA-C  simvastatin (ZOCOR) 20 MG tablet Take 1 tablet (20 mg total) by mouth daily at 6 PM. 12/19/18   Particia NearingLane, Rachel Elizabeth,  PA-C  vitamin C (ASCORBIC ACID) 500 MG tablet Take 500 mg by mouth daily.    [provider]    Allergies Bactrim [sulfamethoxazole-trimethoprim] and Codeine  Family History  Problem Relation Age of Onset  . Cancer Mother   . Cancer Father     Social History Social History   Tobacco Use  . Smoking status: Former Research scientist (life sciences)  . Smokeless tobacco: Never Used  Substance Use Topics  . Alcohol use: Not Currently    Alcohol/week: 0.0 standard drinks  . Drug use: Never     Review of Systems  Constitutional: No  fever/chills Eyes: No visual changes. No discharge ENT: No upper respiratory complaints. Cardiovascular: no chest pain. Respiratory: no cough. No SOB. Gastrointestinal: No abdominal pain.  No nausea, no vomiting.  No diarrhea.  No constipation. Musculoskeletal: Negative for musculoskeletal pain. Skin: Patient has edema and erythema of the right foot.  Neurological: Negative for headaches, focal weakness or numbness.   ____________________________________________   PHYSICAL EXAM:  VITAL SIGNS: ED Triage Vitals  Enc Vitals Group     BP 02/04/19 1816 101/72     Pulse Rate 02/04/19 1816 82     Resp 02/04/19 1816 19     Temp 02/04/19 1816 98.1 F (36.7 C)     Temp Source 02/04/19 1816 Oral     SpO2 02/04/19 1816 96 %     Weight 02/04/19 1817 150 lb (68 kg)     Height 02/04/19 1817 5\' 7"  (1.702 m)     Head Circumference --      Peak Flow --      Pain Score 02/04/19 1820 10     Pain Loc --      Pain Edu? --      Excl. in [place]? --      Constitutional: Alert and oriented. Well appearing and in no acute distress. Eyes: Conjunctivae are normal. PERRL. EOMI. Head: Atraumatic. Cardiovascular: Normal rate, regular rhythm. Normal S1 and S2.  Good peripheral circulation. Respiratory: Normal respiratory effort without tachypnea or retractions. Lungs CTAB. Good air entry to the bases with no decreased or absent breath sounds. Musculoskeletal: Full range of motion to all extremities. No gross deformities appreciated. Neurologic:  Normal speech and language. No gross focal neurologic deficits are appreciated.  Skin: Patient has edema and erythema of the right foot. Psychiatric: Mood and affect are normal. Speech and behavior are normal. Patient exhibits appropriate insight and judgement.   ____________________________________________   LABS (all labs ordered are listed, but only abnormal results are displayed)  Labs Reviewed  CBC WITH DIFFERENTIAL/PLATELET  COMPREHENSIVE METABOLIC  PANEL  LACTIC ACID, PLASMA  LACTIC ACID, PLASMA   ____________________________________________  EKG   ____________________________________________  RADIOLOGY I personally viewed and evaluated these images as part of my medical decision making, as well as reviewing the written report by the radiologist.  Dg Foot Complete Right  Result Date: 02/04/2019 CLINICAL DATA:  Right foot pain after injury last week. Struck foot on a fan, increased pain since that time. EXAM: RIGHT FOOT COMPLETE - 3+ VIEW COMPARISON:  None. FINDINGS: There is no evidence of acute or healing fracture. No dislocation. Degenerative change of the first metatarsal phalangeal joint. Soft tissue edema overlies the metatarsals. IMPRESSION: Soft tissue edema. No acute fracture or subluxation. Electronically Signed   By: Keith Rake M.D.   On: 02/04/2019 19:05    ____________________________________________    PROCEDURES  Procedure(s) performed:    Procedures    Medications - No data  to display   ____________________________________________   INITIAL IMPRESSION / ASSESSMENT AND PLAN / ED COURSE  Pertinent labs & imaging results that were available during my care of the patient were reviewed by me and considered in my medical decision making (see chart for details).  Review of the [place] CSRS was performed in accordance of the [facility] prior to dispensing any controlled drugs.         Assessment and plan Right Foot Pain:  85 year old male presents to the emergency department with right foot pain, erythema and edema for approximately a week.  Vital signs were stable at triage.  Patient was not tachycardic or febrile.  Physical exam, patient had erythema and edema of the right foot.  He did have some right calf pain to palpation.  Differential diagnosis included fracture, cellulitis, DVT, electrolyte abnormality...  Patient had leukocytosis on CBC, 14.9 with associated left shift.  Hypokalemia was  identified on CMP, 2.9.  Ultrasound was negative for thromboembolism.  Blood cultures are pending.  Lactic acid was within reference range.  Patient was given Rocephin in the emergency department and he was discharged with doxycycline and Keflex.  Patient was given p.o. potassium in the emergency department.  He was discharged with 3 days of supplemental potassium.  He was given strict return precautions to return to the emergency department if worsening symptoms occur.  Patient is accompanied by family member who voiced understanding regarding recommendations.  All patient questions were answered.    ____________________________________________  FINAL CLINICAL IMPRESSION(S) / ED DIAGNOSES  Final diagnoses:  None      NEW MEDICATIONS STARTED DURING THIS VISIT:  ED Discharge Orders    None          This chart was dictated using voice recognition software/Dragon. Despite best efforts to proofread, errors can occur which can change the meaning. Any change was purely unintentional.    Orvil FeilWoods, Temple Sporer M, PA-C 02/04/19 2200    Concha SeFunke, Mary E, MD 02/06/19 [phone]

## 2019-02-04 NOTE — Discharge Instructions (Signed)
Take Keflex 3 times daily for the next week. Take doxycycline twice daily for the next week. Take oral potassium by mouth for the next 3 days. Return to the emergency department if symptoms worsen.

## 2019-02-09 LAB — CULTURE, BLOOD (ROUTINE X 2)
Culture: NO GROWTH
Culture: NO GROWTH

## 2019-04-15 ENCOUNTER — Other Ambulatory Visit: Payer: Self-pay | Admitting: Family Medicine

## 2019-05-16 ENCOUNTER — Other Ambulatory Visit: Payer: Self-pay | Admitting: Family Medicine

## 2019-05-16 NOTE — Telephone Encounter (Signed)
Forwarding medication refill requests to PCP for review. 

## 2019-07-09 ENCOUNTER — Other Ambulatory Visit: Payer: Self-pay | Admitting: Family Medicine

## 2019-07-23 ENCOUNTER — Telehealth: Payer: Self-pay | Admitting: Family Medicine

## 2019-07-23 NOTE — Chronic Care Management (AMB) (Signed)
  Chronic Care Management   Note  07/23/2019 Name: Caleb Fleming. MRN: [id number] DOB: February 25, 1932  Caleb Fleming. is a 86 y.o. year old male who is a primary care patient of [facility], [place]. I reached out to [name] by phone today in response to a referral sent by [name]'s health plan.     Mr. Laura was given information about Chronic Care Management services today including:  1. CCM service includes personalized support from designated clinical staff supervised by his physician, including individualized plan of care and coordination with other care providers 2. 24/7 contact phone numbers for assistance for urgent and routine care needs. 3. Service will only be billed when office clinical staff spend 20 minutes or more in a month to coordinate care. 4. Only one practitioner may furnish and bill the service in a calendar month. 5. The patient may stop CCM services at any time (effective at the end of the month) by phone call to the office staff. 6. The patient will be responsible for cost sharing (co-pay) of up to 20% of the service fee (after annual deductible is met).  Patient's daughter in law [name] agreed to services and verbal consent obtained.   Follow up plan: Telephone appointment with care management team member scheduled for: 08/28/2019  [place], [place] [facility]  [place], [place] [address] Direct Dial: [email or web address]  Website: [email or web address]  .

## 2019-07-23 NOTE — Chronic Care Management (AMB) (Signed)
Made in error

## 2019-07-28 ENCOUNTER — Ambulatory Visit: Payer: Medicare Other | Admitting: Family Medicine

## 2019-08-12 ENCOUNTER — Other Ambulatory Visit: Payer: Self-pay

## 2019-08-12 ENCOUNTER — Ambulatory Visit (INDEPENDENT_AMBULATORY_CARE_PROVIDER_SITE_OTHER): Payer: Medicare Other | Admitting: Family Medicine

## 2019-08-12 ENCOUNTER — Encounter: Payer: Self-pay | Admitting: Family Medicine

## 2019-08-12 VITALS — Wt 165.0 lb

## 2019-08-12 DIAGNOSIS — I10 Essential (primary) hypertension: Secondary | ICD-10-CM

## 2019-08-12 DIAGNOSIS — N400 Enlarged prostate without lower urinary tract symptoms: Secondary | ICD-10-CM

## 2019-08-12 DIAGNOSIS — F329 Major depressive disorder, single episode, unspecified: Secondary | ICD-10-CM

## 2019-08-12 DIAGNOSIS — Z599 Problem related to housing and economic circumstances, unspecified: Secondary | ICD-10-CM

## 2019-08-12 DIAGNOSIS — E782 Mixed hyperlipidemia: Secondary | ICD-10-CM | POA: Diagnosis not present

## 2019-08-12 DIAGNOSIS — I4891 Unspecified atrial fibrillation: Secondary | ICD-10-CM

## 2019-08-12 DIAGNOSIS — F419 Anxiety disorder, unspecified: Secondary | ICD-10-CM

## 2019-08-12 DIAGNOSIS — Z598 Other problems related to housing and economic circumstances: Secondary | ICD-10-CM

## 2019-08-12 DIAGNOSIS — G4700 Insomnia, unspecified: Secondary | ICD-10-CM

## 2019-08-12 MED ORDER — FUROSEMIDE 40 MG PO TABS: 40.0000 mg | ORAL_TABLET | Freq: Two times a day (BID) | ORAL | 11 refills | Status: DC

## 2019-08-12 MED ORDER — APIXABAN 5 MG PO TABS: 5.0000 mg | ORAL_TABLET | Freq: Two times a day (BID) | ORAL | 0 refills | Status: DC

## 2019-08-12 MED ORDER — AMIODARONE HCL 200 MG PO TABS: 200.0000 mg | ORAL_TABLET | Freq: Every day | ORAL | 0 refills | Status: DC

## 2019-08-12 MED ORDER — COMBIVENT RESPIMAT 20-100 MCG/ACT IN AERS: 1.0000 | INHALATION_SPRAY | Freq: Four times a day (QID) | RESPIRATORY_TRACT | 2 refills | Status: DC | PRN

## 2019-08-12 MED ORDER — METOPROLOL SUCCINATE ER 25 MG PO TB24: 25.0000 mg | ORAL_TABLET | Freq: Every day | ORAL | 0 refills | Status: DC

## 2019-08-12 NOTE — Progress Notes (Signed)
Wt 165 lb (74.8 kg)   BMI 25.84 kg/m    Subjective:    Patient ID: Caleb Shiver., male    DOB: [birth year], 86 y.o.   MRN: [id number]  HPI: Caleb Fleming is a 86 y.o. male  Chief Complaint  Patient presents with  . Hypertension  . Hyperlipidemia  . Insomnia    . This visit was completed via WebEx due to the restrictions of the COVID-19 pandemic. All issues as above were discussed and addressed. Physical exam was done as above through visual confirmation on WebEx. If it was felt that the patient should be evaluated in the office, they were directed there. The patient verbally consented to this visit. . Location of the patient: home . Location of the provider: work . Those involved with this call:  . Provider: Roosvelt Maser, PA-C . CMA: Elton Sin, CMA . Front Desk/Registration: Harriet Pho  . Time spent on call: 25 minutes with patient face to face via video conference. More than 50% of this time was spent in counseling and coordination of care. 10 minutes total spent in review of patient's record and preparation of their chart. I verified patient identity using two factors (patient name and date of birth). Patient consents verbally to being seen via telemedicine visit today.   Patient presenting today for 6 month f/u chronic conditions.   States he's been off all of his medications for several months. Could not afford them and does not feel like he needs to be on them. Feeling well and not having any concerns at this time.   HTN, atrial fibrillation - Has not been checking home BPs and has been off medications for months. Denies CP, SOB, palpitations. Has not followed up with Cardiology.   BPH - Stable off medications, no new concerns  HLD - Previously on simvastatin, has been off for months. Not following strict diet or exercising. Denies CP, SOB, claudication.   Insomnia - Has struggled with significant insomnia for many years, only medication to ever help  was [place] but has been off the past year or two.   Anxiety and depression - has been on zoloft off and on in the past, most recently last year but doesn't see that it helped any. States things are stable and he doesn't wish to be on anything for this. Denies SI/HI.   No flowsheet data found. No flowsheet data found.    Relevant past medical, surgical, family and social history reviewed and updated as indicated. Interim medical history since our last visit reviewed. Allergies and medications reviewed and updated.  Review of Systems  Per HPI unless specifically indicated above     Objective:    Wt 165 lb (74.8 kg)   BMI 25.84 kg/m   Wt Readings from Last 3 Encounters:  08/12/19 165 lb (74.8 kg)  02/04/19 150 lb (68 kg)  12/19/18 145 lb (65.8 kg)    Physical Exam Vitals and nursing note reviewed.  Constitutional:      General: He is not in acute distress.    Appearance: Normal appearance.  HENT:     Head: Atraumatic.     Right Ear: External ear normal.     Left Ear: External ear normal.     Nose: Nose normal. No congestion.     Mouth/Throat:     Mouth: Mucous membranes are moist.     Pharynx: Oropharynx is clear.  Eyes:     Extraocular Movements: Extraocular movements intact.  Conjunctiva/sclera: Conjunctivae normal.  Pulmonary:     Effort: Pulmonary effort is normal. No respiratory distress.  Musculoskeletal:        General: Normal range of motion.     Cervical back: Normal range of motion.  Skin:    General: Skin is dry.     Findings: No erythema or rash.  Neurological:     Mental Status: He is oriented to person, place, and time.  Psychiatric:        Mood and Affect: Mood normal.        Thought Content: Thought content normal.        Judgment: Judgment normal.     Results for orders placed or performed during the hospital encounter of 02/04/19  Blood culture (routine x 2)   Specimen: BLOOD  Result Value Ref Range   Specimen Description BLOOD LEFT  ANTECUBITAL    Special Requests      BOTTLES DRAWN AEROBIC AND ANAEROBIC Blood Culture results may not be optimal due to an excessive volume of blood received in culture bottles   Culture      NO GROWTH 5 DAYS Performed at [facility], [address] [place], [place] [address]    Report Status 02/09/2019 FINAL   Blood culture (routine x 2)   Specimen: BLOOD  Result Value Ref Range   Specimen Description BLOOD RIGHT ANTECUBITAL    Special Requests      BOTTLES DRAWN AEROBIC AND ANAEROBIC Blood Culture results may not be optimal due to an excessive volume of blood received in culture bottles   Culture      NO GROWTH 5 DAYS Performed at [facility], [address] [place], [place] [address]    Report Status 02/09/2019 FINAL   CBC with Differential  Result Value Ref Range   WBC 14.9 (H) 4.0 - 10.5 K/uL   RBC 4.68 4.22 - 5.81 MIL/uL   Hemoglobin 14.6 13.0 - 17.0 g/dL   HCT 56.4 33.2 - 95.1 %   MCV 89.5 80.0 - 100.0 fL   MCH 31.2 26.0 - 34.0 pg   MCHC 34.8 30.0 - 36.0 g/dL   RDW 88.4 16.6 - 06.3 %   Platelets 286 150 - 400 K/uL   nRBC 0.0 0.0 - 0.2 %   Neutrophils Relative % 82 %   Neutro Abs 12.2 (H) 1.7 - 7.7 K/uL   Lymphocytes Relative 7 %   Lymphs Abs 1.0 0.7 - 4.0 K/uL   Monocytes Relative 10 %   Monocytes Absolute 1.5 (H) 0.1 - 1.0 K/uL   Eosinophils Relative 0 %   Eosinophils Absolute 0.0 0.0 - 0.5 K/uL   Basophils Relative 0 %   Basophils Absolute 0.1 0.0 - 0.1 K/uL   Immature Granulocytes 1 %   Abs Immature Granulocytes 0.08 (H) 0.00 - 0.07 K/uL  Comprehensive metabolic panel  Result Value Ref Range   Sodium 138 135 - 145 mmol/L   Potassium 2.9 (L) 3.5 - 5.1 mmol/L   Chloride 99 98 - 111 mmol/L   CO2 26 22 - 32 mmol/L   Glucose, Bld 96 70 - 99 mg/dL   BUN 22 8 - 23 mg/dL   Creatinine, Ser 0.16 (H) 0.61 - 1.24 mg/dL   Calcium 8.7 (L) 8.9 - 10.3 mg/dL   Total Protein 7.2 6.5 - 8.1 g/dL   Albumin 3.9 3.5 - 5.0 g/dL   AST 16 15 - 41 U/L    ALT 15 0 - 44 U/L  Alkaline Phosphatase 44 38 - 126 U/L   Total Bilirubin 1.2 0.3 - 1.2 mg/dL   GFR calc non Af Amer 50 (L) >60 mL/min   GFR calc Af Amer 58 (L) >60 mL/min   Anion gap 13 5 - 15  Lactic acid, plasma  Result Value Ref Range   Lactic Acid, Venous 1.6 0.5 - 1.9 mmol/L      Assessment & Plan:   Problem List Items Addressed This Visit      Cardiovascular and Mediastinum   Hypertension - Primary    Unable to obtain vitals today, and has been off medications for several months. Will restart regimen and recheck in office in 1 month      Relevant Medications   amiodarone (PACERONE) 200 MG tablet   apixaban (ELIQUIS) 5 MG TABS tablet   furosemide (LASIX) 40 MG tablet   metoprolol succinate (TOPROL-XL) 25 MG 24 hr tablet   Atrial fibrillation with rapid ventricular response (HCC)    Restart medications, follow up in clinic in 1 month for exam and vitals      Relevant Medications   amiodarone (PACERONE) 200 MG tablet   apixaban (ELIQUIS) 5 MG TABS tablet   furosemide (LASIX) 40 MG tablet   metoprolol succinate (TOPROL-XL) 25 MG 24 hr tablet     Genitourinary   Benign prostatic hyperplasia    Asymptomatic per patient, continue to monitor. Will check labs at 1 month f/u        Other   Hyperlipidemia    Off medications for several months, due for labs and will check at 1 month f/u after restarting medication      Relevant Medications   amiodarone (PACERONE) 200 MG tablet   apixaban (ELIQUIS) 5 MG TABS tablet   furosemide (LASIX) 40 MG tablet   metoprolol succinate (TOPROL-XL) 25 MG 24 hr tablet   Insomnia    Still symptomatic, but does not wish to try any non-controlled sleep medications. Will continue to monitor, sleep hygiene reviewed      Anxiety and depression    Stable off medication, continue to monitor       Other Visit Diagnoses    Financial difficulty       Will place referral for CCM Pharmacy to help with financial burden of medications as  well as med adherence   Relevant Orders   Referral to Chronic Care Management Services       Follow up plan: Return in about 4 weeks (around 09/09/2019) for complete f/u.

## 2019-08-27 NOTE — Assessment & Plan Note (Signed)
Restart medications, follow up in clinic in 1 month for exam and vitals

## 2019-08-27 NOTE — Assessment & Plan Note (Signed)
Unable to obtain vitals today, and has been off medications for several months. Will restart regimen and recheck in office in 1 month

## 2019-08-27 NOTE — Assessment & Plan Note (Signed)
Off medications for several months, due for labs and will check at 1 month f/u after restarting medication

## 2019-08-27 NOTE — Assessment & Plan Note (Signed)
Still symptomatic, but does not wish to try any non-controlled sleep medications. Will continue to monitor, sleep hygiene reviewed

## 2019-08-27 NOTE — Assessment & Plan Note (Signed)
Stable off medication, continue to monitor. 

## 2019-08-27 NOTE — Assessment & Plan Note (Signed)
Asymptomatic per patient, continue to monitor. Will check labs at 1 month f/u

## 2019-08-28 ENCOUNTER — Encounter: Payer: Self-pay | Admitting: General Practice

## 2019-08-28 ENCOUNTER — Ambulatory Visit (INDEPENDENT_AMBULATORY_CARE_PROVIDER_SITE_OTHER): Payer: Medicare Other | Admitting: General Practice

## 2019-08-28 ENCOUNTER — Telehealth: Payer: Self-pay | Admitting: Family Medicine

## 2019-08-28 ENCOUNTER — Telehealth: Payer: Medicare Other | Admitting: General Practice

## 2019-08-28 DIAGNOSIS — I4891 Unspecified atrial fibrillation: Secondary | ICD-10-CM

## 2019-08-28 DIAGNOSIS — E782 Mixed hyperlipidemia: Secondary | ICD-10-CM

## 2019-08-28 DIAGNOSIS — I10 Essential (primary) hypertension: Secondary | ICD-10-CM

## 2019-08-28 NOTE — Patient Instructions (Signed)
Visit Information  Goals Addressed            This Visit's Progress   . RNCM: He does not have a way of checking his blood pressure, we still can not afford 2 of his medications       CARE PLAN ENTRY (see longtitudinal plan of care for additional care plan information)  Current Barriers:  . Chronic Disease Management support, education, and care coordination needs related to Atrial Fibrillation, HTN, and HLD . Financial Constraints  Clinical Goal(s) related to Atrial Fibrillation, HTN, and HLD:  Over the next 90 days, patient will:  . Work with the care management team to address educational, disease management, and care coordination needs  . Begin or continue self health monitoring activities as directed today Measure and record blood pressure 4 times per week and take medications as prescribed  . Call provider office for new or worsened signs and symptoms Blood pressure findings outside established parameters and New or worsened symptom related to HLD and AFIB . Call care management team with questions or concerns . Verbalize basic understanding of patient centered plan of care established today . Over the next 90 days, Daughter in law will call [facility] and talk to customer service about the over the counter benefit and life alert system for the patient to get blood pressure cuff and system to help with safety  Interventions related to Atrial Fibrillation, HTN, and HLD:  . Evaluation of current treatment plans and patient's adherence to plan as established by provider- per the daughter in law the patient is still not taking the Metoprolol and Zocor due to affordability One is >$100 and the other is $47.00 . Assessed patient understanding of disease states- daughter in law helps the patient manage his care . Assessed patient's education and care coordination needs- denies the need for social work at this time but would like to talk to the pharmacist to assist with cost and resources to help  pay for medications . Provided disease specific education to patient: Heart Healthy diet options, taking medications as prescribed, education and support on checking and recording blood pressure daily for optimal management of HTN . Collaborated with appropriate clinical care team members regarding patient needs . Educated the daughter in Social worker to call  [facility] customer service number on the back of the patients insurance card and request the OTC catalog to be sent and information on obtaining the [employer] system free of charge . Sent EMAIL to [email or web address] so the daughter in law can sign up for myChart and see lab work, communicate with the healthcare team and educational information  Patient Self Care Activities related to Atrial Fibrillation, HTN, and HLD:  . Patient is unable to independently self-manage chronic health conditions  Initial goal documentation        Print copy of patient instructions provided.   The care management team will reach out to the patient again over the next 30 to 60  days.   [name] RN, MSN, CCM Community Care Coordinator [place]  [facility] Mobile: [phone]

## 2019-08-28 NOTE — Chronic Care Management (AMB) (Signed)
Chronic Care Management   Initial Visit Note  08/28/2019 Name: Caleb Fleming. MRN: [id number] DOB: 30-Sep-1931  Referred by: Volney American, PA-C Reason for referral : No chief complaint on file.   Caleb Fleming. is a 86 y.o. year old male who is a primary care patient of Volney American, [place]. The CCM team was consulted for assistance with chronic disease management and care coordination needs related to Atrial Fibrillation, HTN and HLD  Review of patient status, including review of consultants reports, relevant laboratory and other test results, and collaboration with appropriate care team members and the patient's provider was performed as part of comprehensive patient evaluation and provision of chronic care management services.    SDOH (Social Determinants of Health) assessments performed: Yes SDOH Interventions     Most Recent Value  SDOH Interventions  SDOH Interventions for the Following Domains  Tobacco [former smoker, chews daily- no desire to quit]  Physical Activity Interventions  Other (Comments) [the patient does not do any structued activity, support and education]  Alcohol Brief Interventions/Follow-up  Alcohol Education, Brief Advice [education and support, per the DIL the patient drinks daily and does not desire to quit]       Medications: Outpatient Encounter Medications as of 08/28/2019  Medication Sig   acetaminophen (TYLENOL) 325 MG tablet Take 650 mg by mouth every 4 (four) hours as needed.   amiodarone (PACERONE) 200 MG tablet Take 1 tablet (200 mg total) by mouth daily.   apixaban (ELIQUIS) 5 MG TABS tablet Take 1 tablet (5 mg total) by mouth 2 (two) times daily.   Cholecalciferol (VITAMIN D3) 25 MCG (1000 UT) CAPS Take 1,000 Units by mouth daily.   ferrous gluconate (FERGON) 324 MG tablet Take 324 mg by mouth daily with breakfast.   furosemide (LASIX) 40 MG tablet Take 1 tablet (40 mg total) by mouth 2 (two) times daily.    Omega-3 1000 MG CAPS Take 2 g by mouth daily.   vitamin C (ASCORBIC ACID) 500 MG tablet Take 500 mg by mouth daily.   Fluticasone-Salmeterol (ADVAIR DISKUS) 250-50 MCG/DOSE AEPB Inhale 1 puff into the lungs 2 (two) times daily. (Patient not taking: Reported on 08/28/2019)   Ipratropium-Albuterol (COMBIVENT RESPIMAT) 20-100 MCG/ACT AERS respimat Inhale 1 puff into the lungs every 6 (six) hours as needed for wheezing. (Patient not taking: Reported on 08/28/2019)   metoprolol succinate (TOPROL-XL) 25 MG 24 hr tablet Take 1 tablet (25 mg total) by mouth daily. (Patient not taking: Reported on 08/28/2019)   Misc Natural Products (TUMERSAID) TABS Take 1 tablet by mouth daily.   potassium chloride (K-DUR) 10 MEQ tablet Take 2 tablets (20 mEq total) by mouth 2 (two) times daily for 3 days.   simvastatin (ZOCOR) 20 MG tablet TAKE 1 TABLET (20 MG TOTAL) BY MOUTH DAILY AT 6 PM. (Patient not taking: Reported on 08/28/2019)   No facility-administered encounter medications on file as of 08/28/2019.     Objective:  BP Readings from Last 3 Encounters:  02/04/19 108/77  01/22/19 (!) 128/91  12/19/18 116/70    Goals Addressed            This Visit's Progress    RNCM: He does not have a way of checking his blood pressure, we still can not afford 2 of his medications       CARE PLAN ENTRY (see longtitudinal plan of care for additional care plan information)  Current Barriers:   Chronic Disease Management support, education, and  care coordination needs related to Atrial Fibrillation, HTN, and HLD  Financial Constraints  Clinical Goal(s) related to Atrial Fibrillation, HTN, and HLD:  Over the next 90 days, patient will:   Work with the care management team to address educational, disease management, and care coordination needs   Begin or continue self health monitoring activities as directed today Measure and record blood pressure 4 times per week and take medications as prescribed   Call  provider office for new or worsened signs and symptoms Blood pressure findings outside established parameters and New or worsened symptom related to HLD and AFIB  Call care management team with questions or concerns  Verbalize basic understanding of patient centered plan of care established today  Over the next 46 days, Daughter in law will call [facility] and talk to customer service about the over the counter benefit and life alert system for the patient to get blood pressure cuff and system to help with safety  Interventions related to Atrial Fibrillation, HTN, and HLD:   Evaluation of current treatment plans and patient's adherence to plan as established by provider- per the daughter in law the patient is still not taking the Metoprolol and Zocor due to affordability One is >$100 and the other is $47.00  Assessed patient understanding of disease states- daughter in law helps the patient manage his care  Assessed patient's education and care coordination needs- denies the need for social work at this time but would like to talk to the pharmacist to assist with cost and resources to help pay for medications  Provided disease specific education to patient: Heart Healthy diet options, taking medications as prescribed, education and support on checking and recording blood pressure daily for optimal management of HTN  Collaborated with appropriate clinical care team members regarding patient needs  Educated the daughter in law to call  [facility] customer service number on the back of the patients insurance card and request the OTC catalog to be sent and information on obtaining the [employer] system free of charge  Sent EMAIL to [email or web address] so the daughter in law can sign up for myChart and see lab work, communicate with the healthcare team and educational information  Patient Self Care Activities related to Atrial Fibrillation, HTN, and HLD:   Patient is unable to independently  self-manage chronic health conditions  Initial goal documentation         Caleb Fleming was given information about Chronic Care Management services today including:  1. CCM service includes personalized support from designated clinical staff supervised by his physician, including individualized plan of care and coordination with other care providers 2. 24/7 contact phone numbers for assistance for urgent and routine care needs. 3. Service will only be billed when office clinical staff spend 20 minutes or more in a month to coordinate care. 4. Only one practitioner may furnish and bill the service in a calendar month. 5. The patient may stop CCM services at any time (effective at the end of the month) by phone call to the office staff. 6. The patient will be responsible for cost sharing (co-pay) of up to 20% of the service fee (after annual deductible is met).  Patient agreed to services and verbal consent obtained.   Plan:   The care management team will reach out to the patient again over the next 60 days.   Noreene Larsson RN, MSN, [facility] Mobile: [phone]

## 2019-08-28 NOTE — Chronic Care Management (AMB) (Signed)
  Chronic Care Management   Note  08/28/2019 Name: Caleb Fleming. MRN: [id number] DOB: 04-05-1932  Caleb Gear. is a 86 y.o. year old male who is a primary care patient of Particia Nearing, [place]. Caleb Leonhardt. is currently enrolled in care management services. An additional referral for Pharm D  was placed.   Follow up plan: Telephone appointment with care management team member scheduled for:10/06/2019  [name], LPN Health Advisor, Embedded Care Coordination [facility] Health Care Management ??[email or web address] ??[phone]

## 2019-08-31 ENCOUNTER — Encounter: Payer: Self-pay | Admitting: Family Medicine

## 2019-08-31 NOTE — Progress Notes (Signed)
Called pt to schedule, no answer, left vm. Sending letter

## 2019-09-02 ENCOUNTER — Ambulatory Visit: Payer: Self-pay | Admitting: Pharmacist

## 2019-09-02 DIAGNOSIS — E782 Mixed hyperlipidemia: Secondary | ICD-10-CM | POA: Diagnosis not present

## 2019-09-02 DIAGNOSIS — I4891 Unspecified atrial fibrillation: Secondary | ICD-10-CM

## 2019-09-02 DIAGNOSIS — I10 Essential (primary) hypertension: Secondary | ICD-10-CM | POA: Diagnosis not present

## 2019-09-02 NOTE — Chronic Care Management (AMB) (Signed)
Chronic Care Management   Note  09/02/2019 Name: Caleb Fleming. MRN: [id number] DOB: 05-Jan-1932   Subjective:  Caleb Fleming. is a 86 y.o. year old male who is a primary care patient of Volney American, [place]. The CCM team was consulted for assistance with chronic disease management and care coordination needs.    Contacted patient for urgent medication access needs.  Review of patient status, including review of consultants reports, laboratory and other test data, was performed as part of comprehensive evaluation and provision of chronic care management services.   SDOH (Social Determinants of Health) assessments and interventions performed:    Objective:  Lab Results  Component Value Date   CREATININE 1.30 (H) 02/04/2019   CREATININE 1.69 (H) 12/12/2018   CREATININE 1.19 12/09/2018    No results found for: HGBA1C     Component Value Date/Time   CHOL 166 12/19/2018 1452   TRIG 100 12/19/2018 1452   HDL 89 12/19/2018 1452   LDLCALC 57 12/19/2018 1452    Clinical ASCVD: No  The ASCVD Risk score Mikey Bussing DC Jr., et al., 2013) failed to calculate for the following reasons:   The 2013 ASCVD risk score is only valid for ages 29 to 62    BP Readings from Last 3 Encounters:  02/04/19 108/77  01/22/19 (!) 128/91  12/19/18 116/70    Allergies  Allergen Reactions  . Bactrim [Sulfamethoxazole-Trimethoprim] Itching  . Codeine     Medications Reviewed Today    Reviewed by De Hollingshead, [facility] (Pharmacist) on 09/02/19 at 0930  Med List Status: <None>  Medication Order Taking? Sig Documenting Provider Last Dose Status Informant  acetaminophen (TYLENOL) 325 MG tablet [id number] Yes Take 650 mg by mouth every 4 (four) hours as needed. [provider] Taking Active Self  amiodarone (PACERONE) 200 MG tablet [id number] Yes Take 1 tablet (200 mg total) by mouth daily. Volney American, PA-C Taking Active   apixaban (ELIQUIS) 5 MG TABS tablet  [id number] No Take 1 tablet (5 mg total) by mouth 2 (two) times daily.  Patient not taking: Reported on 09/02/2019   Volney American, PA-C Not Taking Active   Cholecalciferol (VITAMIN D3) 25 MCG (1000 UT) CAPS [id number] No Take 1,000 Units by mouth daily. [provider] Not Taking Active Self  ferrous gluconate (FERGON) 324 MG tablet [id number] No Take 324 mg by mouth daily with breakfast. [provider] Not Taking Active Self  Fluticasone-Salmeterol (ADVAIR DISKUS) 250-50 MCG/DOSE AEPB [id number] No Inhale 1 puff into the lungs 2 (two) times daily.  Patient not taking: Reported on 08/28/2019   Dustin Flock, MD Not Taking Active   furosemide (LASIX) 40 MG tablet [id number] Yes Take 1 tablet (40 mg total) by mouth 2 (two) times daily. Volney American, PA-C Taking Active   Ipratropium-Albuterol (COMBIVENT RESPIMAT) 20-100 MCG/ACT AERS respimat [id number] No Inhale 1 puff into the lungs every 6 (six) hours as needed for wheezing.  Patient not taking: Reported on 08/28/2019   Volney American, PA-C Not Taking Active   metoprolol succinate (TOPROL-XL) 25 MG 24 hr tablet [id number] Yes Take 1 tablet (25 mg total) by mouth daily. Volney American, [place] Taking Active   Omega-3 1000 MG CAPS [id number] Yes Take 2 g by mouth daily. [provider] Taking Active Self  potassium chloride (K-DUR) 10 MEQ tablet [id number] No Take 2 tablets (20 mEq total) by mouth 2 (two) times daily for 3 days.  Patient not taking: Reported on  09/02/2019   Lannie Fields, PA-C Not Taking Expired 02/07/19 2359   simvastatin (ZOCOR) 20 MG tablet [id number] No TAKE 1 TABLET (20 MG TOTAL) BY MOUTH DAILY AT 6 PM.  Patient not taking: Reported on 08/28/2019   Volney American, PA-C Not Taking Active   vitamin C (ASCORBIC ACID) 500 MG tablet [id number] Yes Take 500 mg by mouth daily. [provider] Taking Active Self           Assessment:   Goals Addressed              This Visit's Progress     Patient Stated   . PharmD "He cannot afford his medications" (pt-stated)       CARE PLAN ENTRY (see longtitudinal plan of care for additional care plan information)  Current Barriers:  . Polypharmacy; complex patient with multiple comorbidities including atrial fibrillation, BPH, HLD . His daughter in law, [name], helps manage his medications. She reports that patient does not drive, so she picks up his medications and fills a weekly pill box.  . Most recent eGFR: ~50, though OVERDUE for labs o Atrial Fibrillation: follows w/ Dr. Saralyn Pilar. Noted that he was unable to afford his medications and didn't feel like he needed them, so has been off for the past several months. Amiodarone 200 mg daily, metoprolol succinate 25 mg daily; Eliquis 5 mg BID; Daughter reports that they do NOT have Eliquis at home due to the cost.  o HLD: simvastatin 20 mg; [name] does not think the patient has this at home right now; last LDL at goal <70 o Suspected COPD (diagnosed at hospital admission 12/2018): discharged on Adviar 250/50 BID and Combivent. Patient has NOT been taking these. No documented spirometry on file  Pharmacist Clinical Goal(s):  Marland Kitchen Over the next 90 days, patient will work with PharmD and provider towards optimized medication management  Interventions: . Comprehensive medication review performed; medication list updated in electronic medical record . Spoke with daughter, [name]. Reviewed medications and indications, and especially importance of anticoagulant given atrial fibrillation . [facility]. They are unable to carry samples anymore, so they do not have any Eliquis samples to provide. Did pass along the message that patient has NOT been taking any of his medications for a few months, they plan to call to schedule for follow up . Contacted [facility]. Requested they fill simvastatin and Eliquis. Simvastatin is ~$2, Eliquis is $47/30 day. Appears the free  30 day starter coupon was used in June for Eliquis.  . Patient NEEDS spirometry. ICS/LABA likely not appropriate therapy, recommend LAMA or LAMA/LABA.  Marland Kitchen Requested patient return my call when he was awake. By end of business, he had not called me back.   Patient Self Care Activities:  . Patient will take medications as prescribed  Initial goal documentation        Plan: - If I do not hear back from patient, will call back within 1-2 weeks  Catie Darnelle Maffucci, PharmD, [place] [phone]

## 2019-09-02 NOTE — Patient Instructions (Signed)
Visit Information  Goals Addressed            This Visit's Progress     Patient Stated   . PharmD "He cannot afford his medications" (pt-stated)       CARE PLAN ENTRY (see longtitudinal plan of care for additional care plan information)  Current Barriers:  . Polypharmacy; complex patient with multiple comorbidities including atrial fibrillation, BPH, HLD . His daughter in law, [name], helps manage his medications. She reports that patient does not drive, so she picks up his medications and fills a weekly pill box.  . Most recent eGFR: ~50, though OVERDUE for labs o Atrial Fibrillation: follows w/ Dr. Saralyn Pilar. Noted that he was unable to afford his medications and didn't feel like he needed them, so has been off for the past several months. Amiodarone 200 mg daily, metoprolol succinate 25 mg daily; Eliquis 5 mg BID; Daughter reports that they do NOT have Eliquis at home due to the cost.  o HLD: simvastatin 20 mg; [name] does not think the patient has this at home right now; last LDL at goal <70 o Suspected COPD (diagnosed at hospital admission 12/2018): discharged on Adviar 250/50 BID and Combivent. Patient has NOT been taking these. No documented spirometry on file  Pharmacist Clinical Goal(s):  Marland Kitchen Over the next 90 days, patient will work with PharmD and provider towards optimized medication management  Interventions: . Comprehensive medication review performed; medication list updated in electronic medical record . Spoke with daughter, [name]. Reviewed medications and indications, and especially importance of anticoagulant given atrial fibrillation . [facility]. They are unable to carry samples anymore, so they do not have any Eliquis samples to provide. Did pass along the message that patient has NOT been taking any of his medications for a few months, they plan to call to schedule for follow up . [facility]ed [facility]. Requested they fill simvastatin and Eliquis. Simvastatin is  ~$2, Eliquis is $47/30 day. Appears the free 30 day starter coupon was used in June for Eliquis.  . Patient NEEDS spirometry. ICS/LABA likely not appropriate therapy, recommend LAMA or LAMA/LABA.  Marland Kitchen Requested patient return my call when he was awake. By end of business, he had not called me back.   Patient Self Care Activities:  . Patient will take medications as prescribed  Initial goal documentation        Patient verbalizes understanding of instructions provided today.   Plan: - If I do not hear back from patient, will call back within 1-2 weeks  Catie Darnelle Maffucci, PharmD, [place] [phone]

## 2019-09-07 ENCOUNTER — Telehealth: Payer: Self-pay | Admitting: Family Medicine

## 2019-09-07 NOTE — Telephone Encounter (Signed)
Called pt, no answer, unable to lvm mailing letter.

## 2019-09-07 NOTE — Telephone Encounter (Signed)
-----   Message from Particia Nearing, [place] sent at 08/27/2019  7:20 PM EST ----- Follow up

## 2019-09-10 NOTE — Telephone Encounter (Signed)
Called pt unable to lvm

## 2019-09-16 ENCOUNTER — Ambulatory Visit: Payer: Self-pay | Admitting: Pharmacist

## 2019-09-16 NOTE — Chronic Care Management (AMB) (Signed)
  Chronic Care Management   Note  09/16/2019 Name: Caleb Fleming. MRN: [id number] DOB: 1931-11-02  [name]. is a 86 y.o. year old male who is a primary care patient of Particia Nearing, [place]. The CCM team was consulted for assistance with chronic disease management and care coordination needs.   Attempted to contact patient for medication management review and follow up on urgent medication access needs. Left HIPAA compliant message for patient to return my call at their convenience.   Plan: - Will outreach later this month as previously scheduled  Catie Feliz Beam, PharmD, [facility] Clinical Pharmacist [facility] [phone]

## 2019-09-21 ENCOUNTER — Ambulatory Visit: Payer: Medicare Other | Admitting: Family Medicine

## 2019-09-23 ENCOUNTER — Other Ambulatory Visit: Payer: Self-pay

## 2019-09-23 ENCOUNTER — Ambulatory Visit (INDEPENDENT_AMBULATORY_CARE_PROVIDER_SITE_OTHER): Payer: Medicare Other | Admitting: Family Medicine

## 2019-09-23 ENCOUNTER — Encounter: Payer: Self-pay | Admitting: Family Medicine

## 2019-09-23 VITALS — BP 146/83 | HR 53 | Temp 98.0°F | Ht 67.0 in | Wt 164.0 lb

## 2019-09-23 DIAGNOSIS — R0602 Shortness of breath: Secondary | ICD-10-CM | POA: Diagnosis not present

## 2019-09-23 DIAGNOSIS — I10 Essential (primary) hypertension: Secondary | ICD-10-CM

## 2019-09-23 DIAGNOSIS — E782 Mixed hyperlipidemia: Secondary | ICD-10-CM

## 2019-09-23 DIAGNOSIS — F32A Depression, unspecified: Secondary | ICD-10-CM

## 2019-09-23 DIAGNOSIS — Z23 Encounter for immunization: Secondary | ICD-10-CM

## 2019-09-23 DIAGNOSIS — I4891 Unspecified atrial fibrillation: Secondary | ICD-10-CM | POA: Diagnosis not present

## 2019-09-23 DIAGNOSIS — F419 Anxiety disorder, unspecified: Secondary | ICD-10-CM

## 2019-09-23 DIAGNOSIS — F329 Major depressive disorder, single episode, unspecified: Secondary | ICD-10-CM

## 2019-09-23 MED ORDER — SIMVASTATIN 20 MG PO TABS: 20.0000 mg | ORAL_TABLET | Freq: Every day | ORAL | 1 refills | Status: DC

## 2019-09-23 MED ORDER — APIXABAN 5 MG PO TABS: 5.0000 mg | ORAL_TABLET | Freq: Two times a day (BID) | ORAL | 1 refills | Status: DC

## 2019-09-23 NOTE — Progress Notes (Signed)
BP (!) 146/83   Pulse (!) 53   Temp 98 F (36.7 C) (Oral)   Ht 5\' 7"  (1.702 m)   Wt 164 lb (74.4 kg)   SpO2 100%   BMI 25.69 kg/m    Subjective:    Patient ID: Caleb Fleming., male    DOB: 1932/07/02, 86 y.o.   MRN: [id number]  HPI: Bow Buntyn is a 86 y.o. male  Chief Complaint  Patient presents with  . Hypertension  . Hyperlipidemia  Presenting today for chronic condition follow up after restarting some of his medications after months of being off. Overall tolerating things well. Hx of atrial fibrillation, HTN, HLD. Denies CP, SOB, dizziness, HAs. Has not been checking home BP readings. Not following strict diet, not exercising. Overdue for Cardiology f/u.   Cannot figure out how to work inhalers and does not wish to be on them. Breathing well without exacerbations.   Anxiety and depression - previously on SSRIs which didn't seem to help him. This has been a long standing issue that he declines medication for.   Depression screen PHQ 2/9 08/28/2019  Decreased Interest 0  Down, Depressed, Hopeless 0  PHQ - 2 Score 0    Relevant past medical, surgical, family and social history reviewed and updated as indicated. Interim medical history since our last visit reviewed. Allergies and medications reviewed and updated.  Review of Systems  Per HPI unless specifically indicated above     Objective:    BP (!) 146/83   Pulse (!) 53   Temp 98 F (36.7 C) (Oral)   Ht 5\' 7"  (1.702 m)   Wt 164 lb (74.4 kg)   SpO2 100%   BMI 25.69 kg/m   Wt Readings from Last 3 Encounters:  09/23/19 164 lb (74.4 kg)  08/12/19 165 lb (74.8 kg)  02/04/19 150 lb (68 kg)    Physical Exam Vitals and nursing note reviewed.  Constitutional:      Appearance: Normal appearance.  HENT:     Head: Atraumatic.  Eyes:     Extraocular Movements: Extraocular movements intact.     Conjunctiva/sclera: Conjunctivae normal.  Cardiovascular:     Comments: Mildly bradycardic  rate Pulmonary:     Effort: Pulmonary effort is normal.     Breath sounds: Normal breath sounds. No wheezing.  Musculoskeletal:        General: Normal range of motion.     Cervical back: Normal range of motion and neck supple.  Skin:    General: Skin is warm and dry.  Neurological:     General: No focal deficit present.     Mental Status: He is oriented to person, place, and time.  Psychiatric:        Mood and Affect: Mood normal.        Thought Content: Thought content normal.        Judgment: Judgment normal.     Results for orders placed or performed in visit on 09/23/19  CBC with Differential/Platelet  Result Value Ref Range   WBC 9.6 3.4 - 10.8 x10E3/uL   RBC 4.61 4.14 - 5.80 x10E6/uL   Hemoglobin 15.0 13.0 - 17.7 g/dL   Hematocrit 43.5 37.5 - 51.0 %   MCV 94 79 - 97 fL   MCH 32.5 26.6 - 33.0 pg   MCHC 34.5 31.5 - 35.7 g/dL   RDW 12.6 11.6 - 15.4 %   Platelets 269 150 - 450 x10E3/uL   Neutrophils 68  Not Estab. %   Lymphs 18 Not Estab. %   Monocytes 9 Not Estab. %   Eos 4 Not Estab. %   Basos 1 Not Estab. %   Neutrophils Absolute 6.4 1.4 - 7.0 x10E3/uL   Lymphocytes Absolute 1.7 0.7 - 3.1 x10E3/uL   Monocytes Absolute 0.9 0.1 - 0.9 x10E3/uL   EOS (ABSOLUTE) 0.4 0.0 - 0.4 x10E3/uL   Basophils Absolute 0.1 0.0 - 0.2 x10E3/uL   Immature Granulocytes 0 Not Estab. %   Immature Grans (Abs) 0.0 0.0 - 0.1 x10E3/uL  Comprehensive metabolic panel  Result Value Ref Range   Glucose 86 65 - 99 mg/dL   BUN 21 8 - 27 mg/dL   Creatinine, Ser 9.70 (H) 0.76 - 1.27 mg/dL   GFR calc non Af Amer 39 (L) >59 mL/min/1.73   GFR calc Af Amer 45 (L) >59 mL/min/1.73   BUN/Creatinine Ratio 13 10 - 24   Sodium 143 134 - 144 mmol/L   Potassium 2.8 (L) 3.5 - 5.2 mmol/L   Chloride 97 96 - 106 mmol/L   CO2 27 20 - 29 mmol/L   Calcium 9.1 8.6 - 10.2 mg/dL   Total Protein 6.5 6.0 - 8.5 g/dL   Albumin 4.3 3.6 - 4.6 g/dL   Globulin, Total 2.2 1.5 - 4.5 g/dL   Albumin/Globulin Ratio 2.0 1.2 -  2.2   Bilirubin Total 0.4 0.0 - 1.2 mg/dL   Alkaline Phosphatase 81 39 - 117 IU/L   AST 16 0 - 40 IU/L   ALT 15 0 - 44 IU/L  Lipid Panel w/o Chol/HDL Ratio  Result Value Ref Range   Cholesterol, Total 192 100 - 199 mg/dL   Triglycerides 84 0 - 149 mg/dL   HDL 70 >26 mg/dL   VLDL Cholesterol Cal 15 5 - 40 mg/dL   LDL Chol Calc (NIH) 378 (H) 0 - 99 mg/dL      Assessment & Plan:   Problem List Items Addressed This Visit      Cardiovascular and Mediastinum   Hypertension    BP mildly elevated today, start checking home readings and f/u in 3 months for recheck. Call with persistent abnormal reading sin meantime and work on getting back in with Cardiology as overdue for f/u      Relevant Medications   simvastatin (ZOCOR) 20 MG tablet   apixaban (ELIQUIS) 5 MG TABS tablet   Other Relevant Orders   CBC with Differential/Platelet (Completed)   Atrial fibrillation with rapid ventricular response (HCC)    Asymptomatic, rate mildly bradycardic today. Begin monitoring home readings and continue current regimen. Will work on f/u with Cardiology as overdue      Relevant Medications   simvastatin (ZOCOR) 20 MG tablet   apixaban (ELIQUIS) 5 MG TABS tablet     Other   Hyperlipidemia - Primary    Recheck lipids, adjust as needed. Work on lifestyle modifications for improved control      Relevant Medications   simvastatin (ZOCOR) 20 MG tablet   apixaban (ELIQUIS) 5 MG TABS tablet   Other Relevant Orders   Comprehensive metabolic panel (Completed)   Lipid Panel w/o Chol/HDL Ratio (Completed)   Anxiety and depression    Pt continues to decline medications. Continue to monitor       Other Visit Diagnoses    SOB (shortness of breath)       Stable, under good control per patient. Refusing inhalers. Continue to monitor   Need for pneumococcal vaccine  Relevant Orders   Pneumococcal conjugate vaccine 13-valent IM (Completed)       Follow up plan: Return in about 3 months  (around 12/24/2019) for 3 month f/u.

## 2019-09-24 LAB — LIPID PANEL W/O CHOL/HDL RATIO
Cholesterol, Total: 192 mg/dL (ref 100–199)
HDL: 70 mg/dL (ref >39)
LDL Chol Calc (NIH): 107 mg/dL — ABNORMAL HIGH (ref 0–99)
Triglycerides: 84 mg/dL (ref 0–149)
VLDL Cholesterol Cal: 15 mg/dL (ref 5–40)

## 2019-09-24 LAB — CBC WITH DIFFERENTIAL/PLATELET
Basophils Absolute: 0.1 10*3/uL (ref 0.0–0.2)
Basos: 1 %
EOS (ABSOLUTE): 0.4 10*3/uL (ref 0.0–0.4)
Eos: 4 %
Hematocrit: 43.5 % (ref 37.5–51.0)
Hemoglobin: 15 g/dL (ref 13.0–17.7)
Immature Grans (Abs): 0 10*3/uL (ref 0.0–0.1)
Immature Granulocytes: 0 %
Lymphocytes Absolute: 1.7 10*3/uL (ref 0.7–3.1)
Lymphs: 18 %
MCH: 32.5 pg (ref 26.6–33.0)
MCHC: 34.5 g/dL (ref 31.5–35.7)
MCV: 94 fL (ref 79–97)
Monocytes Absolute: 0.9 10*3/uL (ref 0.1–0.9)
Monocytes: 9 %
Neutrophils Absolute: 6.4 10*3/uL (ref 1.4–7.0)
Neutrophils: 68 %
Platelets: 269 10*3/uL (ref 150–450)
RBC: 4.61 x10E6/uL (ref 4.14–5.80)
RDW: 12.6 % (ref 11.6–15.4)
WBC: 9.6 10*3/uL (ref 3.4–10.8)

## 2019-09-24 LAB — COMPREHENSIVE METABOLIC PANEL
ALT: 15 IU/L (ref 0–44)
AST: 16 IU/L (ref 0–40)
Albumin/Globulin Ratio: 2 (ref 1.2–2.2)
Albumin: 4.3 g/dL (ref 3.6–4.6)
Alkaline Phosphatase: 81 IU/L (ref 39–117)
BUN/Creatinine Ratio: 13 (ref 10–24)
BUN: 21 mg/dL (ref 8–27)
Bilirubin Total: 0.4 mg/dL (ref 0.0–1.2)
CO2: 27 mmol/L (ref 20–29)
Calcium: 9.1 mg/dL (ref 8.6–10.2)
Chloride: 97 mmol/L (ref 96–106)
Creatinine, Ser: 1.58 mg/dL — ABNORMAL HIGH (ref 0.76–1.27)
GFR calc Af Amer: 45 mL/min/{1.73_m2} — ABNORMAL LOW (ref >59)
GFR calc non Af Amer: 39 mL/min/{1.73_m2} — ABNORMAL LOW (ref >59)
Globulin, Total: 2.2 g/dL (ref 1.5–4.5)
Glucose: 86 mg/dL (ref 65–99)
Potassium: 2.8 mmol/L — ABNORMAL LOW (ref 3.5–5.2)
Sodium: 143 mmol/L (ref 134–144)
Total Protein: 6.5 g/dL (ref 6.0–8.5)

## 2019-09-25 NOTE — Assessment & Plan Note (Signed)
Recheck lipids, adjust as needed. Work on lifestyle modifications for improved control

## 2019-09-25 NOTE — Assessment & Plan Note (Signed)
Asymptomatic, rate mildly bradycardic today. Begin monitoring home readings and continue current regimen. Will work on f/u with Cardiology as overdue

## 2019-09-25 NOTE — Assessment & Plan Note (Signed)
Pt continues to decline medications. Continue to monitor

## 2019-09-25 NOTE — Assessment & Plan Note (Signed)
BP mildly elevated today, start checking home readings and f/u in 3 months for recheck. Call with persistent abnormal reading sin meantime and work on getting back in with Cardiology as overdue for f/u

## 2019-10-06 ENCOUNTER — Telehealth: Payer: Medicare Other

## 2019-10-06 ENCOUNTER — Ambulatory Visit: Payer: Self-pay | Admitting: Pharmacist

## 2019-10-06 NOTE — Chronic Care Management (AMB) (Signed)
  Chronic Care Management   Note  10/06/2019 Name: Caleb Fleming. MRN: [id number] DOB: 1932-03-26  [name]. is a 86 y.o. year old male who is a primary care patient of Particia Nearing, [place]. The CCM team was consulted for assistance with chronic disease management and care coordination needs.    Attempted to contact patient for medication management review. Left HIPAA compliant message for patient to return my call at their convenience.   Plan: - Will collaborate with Care Guide to outreach to schedule follow up with me  Catie Feliz Beam, PharmD, [facility] Clinical Pharmacist [facility] [phone]

## 2019-10-07 ENCOUNTER — Telehealth: Payer: Self-pay | Admitting: Family Medicine

## 2019-10-07 NOTE — Chronic Care Management (AMB) (Signed)
  Care Management   Note  10/07/2019 Name: Caleb Fleming. MRN: [id number] DOB: 04/12/1932  Caleb Fleming. is a 86 y.o. year old male who is a primary care patient of Particia Nearing, Cordelia Poche and is actively engaged with the care management team. I reached out to [name]. by phone today to assist with re-scheduling a follow up visit with the Pharmacist  Follow up plan: Unsuccessful telephone outreach attempt made. A HIPPA compliant phone message was left for the patient providing contact information and requesting a return call.  The care management team will reach out to the patient again over the next 7 days.  If patient returns call to provider office, please advise to call Embedded Care Management Care Guide Penne Lash  at [phone]  Penne Lash, RMA Care Guide, Embedded Care Coordination [facility]  [place], [place] [address] Direct Dial: [phone] [email or web address] Website: [email or web address]

## 2019-10-12 NOTE — Chronic Care Management (AMB) (Signed)
  Care Management   Note  10/12/2019 Name: Caleb Fleming. MRN: [id number] DOB: 08/19/1931  Caleb Fleming. is a 86 y.o. year old male who is a primary care patient of Particia Nearing, Cordelia Poche and is actively engaged with the care management team. I reached out to [name]. by phone today to assist with re-scheduling an initial visit with the Pharmacist  Follow up plan: Telephone appointment with care management team member scheduled for:10/12/2019  Penne Lash, RMA Care Guide, Embedded Care Coordination [facility]  [place], [place] [address] Direct Dial: [phone] [email or web address] Website: [email or web address]

## 2019-10-15 ENCOUNTER — Telehealth: Payer: Self-pay | Admitting: Family Medicine

## 2019-10-15 NOTE — Chronic Care Management (AMB) (Signed)
  Care Management   Note  10/15/2019 Name: Nakoa Ganus. MRN: [id number] DOB: 17-Sep-1931  Emeterio Balke. is a 86 y.o. year old male who is a primary care patient of Particia Nearing, Cordelia Poche and is actively engaged with the care management team. I reached out to [name]. by phone today to assist with re-scheduling a follow up visit with the RN Case Manager  Follow up plan: Unsuccessful telephone outreach attempt made. A HIPPA compliant phone message was left for the patient providing contact information and requesting a return call. The care management team will reach out to the patient again over the next 7 days. If patient returns call to provider office, please advise to call Embedded Care Management Care Guide Gwenevere Ghazi at [phone].  Gwenevere Ghazi  Care Guide, Embedded Care Coordination [facility]  [place], [place] [address] Direct Dial: [phone] [email or web address]: [email or web address]

## 2019-10-16 NOTE — Chronic Care Management (AMB) (Signed)
  Care Management   Note  10/16/2019 Name: Caleb Fleming. MRN: [id number] DOB: 1931-12-18  [name]. is a 86 y.o. year old male who is a primary care patient of Particia Nearing, Cordelia Poche and is actively engaged with the care management team. I reached out to [name]. by phone today to assist with re-scheduling a follow up visit with the RN Case Manager  Follow up plan: Telephone appointment with care management team member scheduled for:11/27/2019.  Gwenevere Ghazi  Care Guide, Embedded Care Coordination [facility]  [place], [place] [address] Direct Dial: [phone] [email or web address] Website: [email or web address]

## 2019-10-21 ENCOUNTER — Telehealth: Payer: Self-pay

## 2019-11-03 ENCOUNTER — Other Ambulatory Visit: Payer: Self-pay | Admitting: Family Medicine

## 2019-11-03 NOTE — Telephone Encounter (Signed)
Requested medications are due for refill today? Yes - This refill cannot be delegated.    Requested medications are on active medication list? Yes  Last Refill:  08/12/2019  # 90 with no refills  Future visit scheduled?  Yes  Notes to Clinic:  This refill cannot be delegated.

## 2019-11-03 NOTE — Telephone Encounter (Signed)
Routing to provider  

## 2019-11-10 ENCOUNTER — Telehealth: Payer: Medicare Other

## 2019-11-10 ENCOUNTER — Ambulatory Visit: Payer: Self-pay | Admitting: Pharmacist

## 2019-11-10 NOTE — Chronic Care Management (AMB) (Signed)
  Chronic Care Management   Note  11/10/2019 Name: Caleb Fleming. MRN: [id number] DOB: Jan 18, 1932  Caleb Fleming. is a 86 y.o. year old male who is a primary care patient of Particia Nearing, [place]. The CCM team was consulted for assistance with chronic disease management and care coordination needs.   Contacted patient's daughter, [name], to discuss medications. She noted that she wasn't in a good place to talk today, as her daughter was just in a car wreck. Requested that someone call tomorrow to reschedule. Will collaborate w/ Care Guide to outreach patient's daughter to rescheduled medication review.    Catie Feliz Beam, PharmD, [facility] Clinical Pharmacist [facility] [phone]

## 2019-11-11 ENCOUNTER — Telehealth: Payer: Self-pay | Admitting: Family Medicine

## 2019-11-11 NOTE — Chronic Care Management (AMB) (Signed)
  Care Management   Note  11/11/2019 Name: Caleb Fleming. MRN: [id number] DOB: 04-18-1932  Caleb Fleming. is a 86 y.o. year old male who is a primary care patient of Particia Nearing, Cordelia Poche and is actively engaged with the care management team. I reached out to [name]. by phone today to assist with re-scheduling a follow up visit with the Pharmacist  Follow up plan: Unsuccessful telephone outreach attempt made. A HIPPA compliant phone message was left for the patient providing contact information and requesting a return call.  The care management team will reach out to the patient again over the next 7 days.  If patient returns call to provider office, please advise to call Embedded Care Management Care Guide Penne Lash  at [phone]  Penne Lash, RMA Care Guide, Embedded Care Coordination [facility]  [place], [place] [address] Direct Dial: [phone] [email or web address] Website: [email or web address]

## 2019-11-13 NOTE — Chronic Care Management (AMB) (Signed)
  Care Management   Note  11/13/2019 Name: Caleb Fleming. MRN: [id number] DOB: 1932/03/11  Caleb Fleming. is a 86 y.o. year old male who is a primary care patient of Particia Nearing, Cordelia Poche and is actively engaged with the care management team. I reached out to Caleb Fleming. by phone today to assist with re-scheduling a follow up visit with the Pharmacist  Follow up plan: Unsuccessful telephone outreach attempt made. A HIPPA compliant phone message was left for the patient providing contact information and requesting a return call.  The care management team will reach out to the patient again over the next 7 days.  If patient returns call to provider office, please advise to call Embedded Care Management Care Guide Caleb Fleming  at [phone]  Caleb Fleming, RMA Care Guide, Embedded Care Coordination [facility]  [place], [place] [address] Direct Dial: [phone] [email or web address]: [email or web address]

## 2019-11-16 NOTE — Chronic Care Management (AMB) (Signed)
  Care Management   Note  11/16/2019 Name: Caleb Fleming. MRN: [id number] DOB: [birth year]  [name]. is a 86 y.o. year old male who is a primary care patient of Particia Nearing, Cordelia Poche and is actively engaged with the care management team. I reached out to [name]. by phone today to assist with re-scheduling a follow up visit with the Pharmacist  Follow up plan: Telephone appointment with care management team member scheduled for:11/17/2019  Penne Lash, RMA Care Guide, Embedded Care Coordination [facility]  [place], [place] [address] Direct Dial: [phone] [email or web address]: [email or web address]

## 2019-11-17 ENCOUNTER — Ambulatory Visit (INDEPENDENT_AMBULATORY_CARE_PROVIDER_SITE_OTHER): Payer: Medicare Other | Admitting: Pharmacist

## 2019-11-17 DIAGNOSIS — E782 Mixed hyperlipidemia: Secondary | ICD-10-CM

## 2019-11-17 DIAGNOSIS — I10 Essential (primary) hypertension: Secondary | ICD-10-CM

## 2019-11-17 DIAGNOSIS — I4891 Unspecified atrial fibrillation: Secondary | ICD-10-CM

## 2019-11-17 DIAGNOSIS — M109 Gout, unspecified: Secondary | ICD-10-CM

## 2019-11-17 NOTE — Patient Instructions (Addendum)
Caleb Fleming and Caleb Fleming,   Thank you for reviewing your medications with me today! Here is how I would recommend taking them:   Morning: - Eliquis 5 mg (blood thinner) - Furosemide 40 mg (fluid pill) - Amiodarone 200 mg (heart rhythm) - Metoprolol succinate 25 mg (heart rate, blood pressure)  Afternoon: - Furosemide 40 mg (wait at least 6-8 hours after first furosemide dose)  Evening: - Eliquis 5 mg  - Simvastatin 20 mg (cholesterol)  Hopefully, you have been scheduled with Caleb Fleming by now to address gout, some repeat lab work, and blood pressures. If not, call the office to schedule this.   If you receive <$1469 per month from [employer], you may qualify for Medicare Extra Help. If approved, your premium would be $0 (so you would not have money deducted from your check every month for insurance), and your brand medications (like Eliquis) would only be ~$9/90 days. Ways to apply:  1) Call the local Social Security office to request a paper application mailed to you 2) Apply on the Social Security website: [email or web address] 3) I can help you apply online - I can read the questions off to you on a phone call, and record your answers.   Let me know what you are most comfortable with.   I recommend calling the Customer Service number on the back of your [employer] card to see if you have "Over the Counter Benefits". If so, you may have some money per month or per quarter from [employer] to spend on over the counter items, which could be a blood pressure machine. If not, I would recommend purchasing one from the local stores. With the medications you are on, it would be good for [place] to be able to occasionally monitor your blood pressure and heart rate at home. I recommend machines with an arm cuff, rather than wrist cuff, as wrist cuffs are generally less accurate.   I'm going to call in ~8 weeks (see the July phone appointment with me) to follow up on  everything!  Catie Darnelle Maffucci, PharmD [phone]  Visit Information  Goals Addressed            This Visit's Progress     Patient Stated   . PharmD "He cannot afford his medications" (pt-stated)       CARE PLAN ENTRY (see longtitudinal plan of care for additional care plan information)  Current Barriers:  . Polypharmacy; complex patient with multiple comorbidities including atrial fibrillation, BPH, HLD o Reports recent concerns with "gout flares". Reports he had a flare that bothered him "for most of April". He self-treated w/ ibuprofen and acetaminophen. No uric acid level on file . His daughter in law, Caleb Fleming, helps manage his medications. She notes that he has not been using a pill box . Most recent eGFR: ~50,  o Atrial Fibrillation: follows w/ Dr. Saralyn Pilar, daughter thinks they will be due for an appointment in ~September. Eliquis 5 mg BID, furosemide 40 mg BID, amiodarone 200 mg daily, metoprolol succinate 25 mg daily - Last potassium in 09/2018 low at 2.5. Patient not on potassium supplement. - Have NOT purchased a BP machine yet, not checking at home o HLD: simvastatin 20 mg, last LDL not at goal <70 o Suspected COPD (diagnosed at hospital admission 12/2018): has stopped using inhalers, patient reports his breathing is "just fine" without them  Pharmacist Clinical Goal(s):  Marland Kitchen Over the next 90 days, patient will work with PharmD and provider towards optimized medication management  Interventions: . Comprehensive medication review performed, medication list updated in electronic medical record . Inter-disciplinary care team collaboration (see longitudinal plan of care) . Reviewed medications and indications for each. Will provide this in writing to Caleb Fleming . Discussed Medicare Extra Help. She is unsure how much patient gets from [employer]. Will provide this information to them in writing as well - they can contact [employer] to have a paper application mailed to them, complete online,  or I can assist with online completion. Will pursue whichever route is most comfortable for patient.  . Provided information about how to investigate BP machine from [employer] . Potassium low at 2.5 on last check. Will collaborate w/ office staff to have patient scheduled for an in office visit for follow up BMP, addressing gout, and BP recheck now that he has been consistently back on medications.   Patient Self Care Activities:  . Patient will take medications as prescribed  Please see past updates related to this goal by clicking on the "Past Updates" button in the selected goal         Patient verbalizes understanding of instructions provided today.   Plan:  - Scheduled f/u call in ~ 6 weeks  Catie Darnelle Maffucci, PharmD, [place]  [phone]

## 2019-11-17 NOTE — Chronic Care Management (AMB) (Signed)
Chronic Care Management   Follow Up Note   11/17/2019 Name: Caleb Fleming. MRN: [id number] DOB: 1932-04-30  Referred by: Caleb American, PA-C Reason for referral : Chronic Care Management (Medication Management)   Caleb Fleming. is a 86 y.o. year old male who is a primary care patient of Caleb Fleming, [place]. The CCM team was consulted for assistance with chronic disease management and care coordination needs.   Contacted patient for medication management review. Spoke with his daughter, Caleb Fleming.    Review of patient status, including review of consultants reports, relevant laboratory and other test results, and collaboration with appropriate care team members and the patient's provider was performed as part of comprehensive patient evaluation and provision of chronic care management services.    SDOH (Social Determinants of Health) assessments performed: Yes See Care Plan activities for detailed interventions related to [facility])     Outpatient Encounter Medications as of 11/17/2019  Medication Sig  . amiodarone (PACERONE) 200 MG tablet TAKE 1 TABLET BY MOUTH EVERY DAY  . apixaban (ELIQUIS) 5 MG TABS tablet Take 1 tablet (5 mg total) by mouth 2 (two) times daily.  . furosemide (LASIX) 40 MG tablet Take 1 tablet (40 mg total) by mouth 2 (two) times daily.  . metoprolol succinate (TOPROL-XL) 25 MG 24 hr tablet Take 1 tablet (25 mg total) by mouth daily.  . simvastatin (ZOCOR) 20 MG tablet Take 1 tablet (20 mg total) by mouth daily at 6 PM.  . acetaminophen (TYLENOL) 325 MG tablet Take 650 mg by mouth every 4 (four) hours as needed.  . [DISCONTINUED] Cholecalciferol (VITAMIN D3) 25 MCG (1000 UT) CAPS Take 1,000 Units by mouth daily.  . [DISCONTINUED] ferrous gluconate (FERGON) 324 MG tablet Take 324 mg by mouth daily with breakfast.  . [DISCONTINUED] Omega-3 1000 MG CAPS Take 2 g by mouth daily.  . [DISCONTINUED] potassium chloride (K-DUR) 10 MEQ tablet Take 2  tablets (20 mEq total) by mouth 2 (two) times daily for 3 days. (Patient not taking: Reported on 09/02/2019)  . [DISCONTINUED] vitamin C (ASCORBIC ACID) 500 MG tablet Take 500 mg by mouth daily.   No facility-administered encounter medications on file as of 11/17/2019.     Objective:   Goals Addressed            This Visit's Progress     Patient Stated   . PharmD "He cannot afford his medications" (pt-stated)       CARE PLAN ENTRY (see longtitudinal plan of care for additional care plan information)  Current Barriers:  . Polypharmacy; complex patient with multiple comorbidities including atrial fibrillation, BPH, HLD o Reports recent concerns with "gout flares". Reports he had a flare that bothered him "for most of April". He self-treated w/ ibuprofen and acetaminophen. No uric acid level on file . His daughter in law, Caleb Fleming, helps manage his medications. She notes that he has not been using a pill box . Most recent eGFR: ~50,  o Atrial Fibrillation: follows w/ Dr. Saralyn Pilar, daughter thinks they will be due for an appointment in ~September. Eliquis 5 mg BID, furosemide 40 mg BID, amiodarone 200 mg daily, metoprolol succinate 25 mg daily - Last potassium in 09/2018 low at 2.5. Patient not on potassium supplement. - Have NOT purchased a BP machine yet, not checking at home o HLD: simvastatin 20 mg, last LDL not at goal <70 o Suspected COPD (diagnosed at hospital admission 12/2018): has stopped using inhalers, patient reports his breathing is "  just fine" without them  Pharmacist Clinical Goal(s):  Marland Kitchen Over the next 90 days, patient will work with PharmD and provider towards optimized medication management  Interventions: . Comprehensive medication review performed, medication list updated in electronic medical record . Inter-disciplinary care team collaboration (see longitudinal plan of care) . Reviewed medications and indications for each. Will provide this in writing to Caleb Fleming . Discussed  Medicare Extra Help. She is unsure how much patient gets from [employer]. Will provide this information to them in writing as well - they can contact [employer] to have a paper application mailed to them, complete online, or I can assist with online completion. Will pursue whichever route is most comfortable for patient.  . Provided information about how to investigate BP machine from [employer] . Potassium low at 2.5 on last check. Will collaborate w/ office staff to have patient scheduled for an in office visit for follow up BMP, addressing gout, and BP recheck now that he has been consistently back on medications.   Patient Self Care Activities:  . Patient will take medications as prescribed  Please see past updates related to this goal by clicking on the "Past Updates" button in the selected goal          Plan:  - Scheduled f/u call in ~ 6 weeks  Catie Darnelle Maffucci, PharmD, [place] [phone]

## 2019-11-18 ENCOUNTER — Ambulatory Visit: Payer: Self-pay | Admitting: Pharmacist

## 2019-11-18 DIAGNOSIS — I10 Essential (primary) hypertension: Secondary | ICD-10-CM | POA: Diagnosis not present

## 2019-11-18 DIAGNOSIS — I4891 Unspecified atrial fibrillation: Secondary | ICD-10-CM | POA: Diagnosis not present

## 2019-11-18 DIAGNOSIS — E782 Mixed hyperlipidemia: Secondary | ICD-10-CM

## 2019-11-18 NOTE — Patient Instructions (Addendum)
Caleb Fleming and Caleb Fleming,   If you receive <$1469 per month from [employer], you may qualify for Medicare Extra Help. If approved, your premium would be $0 (so you would not have money deducted from your check every month for insurance), and your brand medications (like Eliquis) would only be ~$9/90 days. Ways to apply:  1) Call the local Social Security office to request a paper application mailed to you 2) Apply on the Social Security website: [email or web address] 3) I can help you apply online - I can read the questions off to you on a phone call, and record your answers.   If you are over the above income, but <$38,280 for a year, you may qualify for Eliquis assistance from the manufacturer. They also require that you spend 3% of your total yearly income on copays in 2021 - for example, if you make $20,000, you need to spend around $600 on copays (they sometimes give you a $100-200 "credit", so for example, the above example patient may actually be approved once they have spent $500). You can check with [facility] exactly how much you have spent on copays thus far in 2021. We could go ahead and start the application process for assistance - once submitted, the drug company will tell us exactly how much more you need to spend on copays before being approved.    Call me when you have review and discussed the above, and we can decide next steps!  Thanks! Catie Darnelle Maffucci, PharmD  [phone]  Visit Information  Goals Addressed            This Visit's Progress     Patient Stated   . PharmD "He cannot afford his medications" (pt-stated)       CARE PLAN ENTRY (see longtitudinal plan of care for additional care plan information)  Current Barriers:  . Polypharmacy; complex patient with multiple comorbidities including atrial fibrillation, BPH, HLD o Received call back from patient's daughter confirming that Eliquis is very expensive for them - $141/90 . His daughter in law, Caleb Fleming, helps  manage his medications. She notes that he has not been using a pill box . Most recent eGFR: ~50,  o Atrial Fibrillation: follows w/ Dr. Saralyn Pilar, daughter thinks they will be due for an appointment in ~September. Eliquis 5 mg BID, furosemide 40 mg BID, amiodarone 200 mg daily, metoprolol succinate 25 mg daily o HLD: simvastatin 20 mg, last LDL not at goal <70 o Suspected COPD (diagnosed at hospital admission 12/2018): has stopped using inhalers, patient reports his breathing is "just fine" without them  Pharmacist Clinical Goal(s):  Marland Kitchen Over the next 90 days, patient will work with PharmD and provider towards optimized medication management  Interventions: . Comprehensive medication review performed, medication list updated in electronic medical record . Inter-disciplinary care team collaboration (see longitudinal plan of care) . Reviewed income. Caleb Fleming thinks that patient is over income for Medicare Extra Help, but will double check when my mail from yesterday arrives.  . Discussed Eliquis patient assistance program. Reviewed income requirement <$38,280 for a 1 person household, and out of pocket spend requirement of 3% of total yearly income on copays. Will type up these details for patient and his daughter when they come to the office on Friday.   Patient Self Care Activities:  . Patient will take medications as prescribed  Please see past updates related to this goal by clicking on the "Past Updates" button in the selected goal  Patient verbalizes understanding of instructions provided today.  Plan:  - Will collaborate w/ patient and daughter as above  Catie Darnelle Maffucci, PharmD, [place] [phone]

## 2019-11-18 NOTE — Chronic Care Management (AMB) (Signed)
Chronic Care Management   Follow Up Note   11/18/2019 Name: Caleb Fleming. MRN: [id number] DOB: December 23, 1931  Referred by: Volney American, PA-C Reason for referral : Chronic Care Management (Medication Management)   Caleb Fleming. is a 86 y.o. year old male who is a primary care patient of Volney American, [place]. The CCM team was consulted for assistance with chronic disease management and care coordination needs.    Received call from patient's daughter.   Review of patient status, including review of consultants reports, relevant laboratory and other test results, and collaboration with appropriate care team members and the patient's provider was performed as part of comprehensive patient evaluation and provision of chronic care management services.    SDOH (Social Determinants of Health) assessments performed: Yes See Care Plan activities for detailed interventions related to [facility])     Outpatient Encounter Medications as of 11/18/2019  Medication Sig  . acetaminophen (TYLENOL) 325 MG tablet Take 650 mg by mouth every 4 (four) hours as needed.  Marland Kitchen amiodarone (PACERONE) 200 MG tablet TAKE 1 TABLET BY MOUTH EVERY DAY  . apixaban (ELIQUIS) 5 MG TABS tablet Take 1 tablet (5 mg total) by mouth 2 (two) times daily.  . furosemide (LASIX) 40 MG tablet Take 1 tablet (40 mg total) by mouth 2 (two) times daily.  . metoprolol succinate (TOPROL-XL) 25 MG 24 hr tablet Take 1 tablet (25 mg total) by mouth daily.  . simvastatin (ZOCOR) 20 MG tablet Take 1 tablet (20 mg total) by mouth daily at 6 PM.   No facility-administered encounter medications on file as of 11/18/2019.     Objective:   Goals Addressed            This Visit's Progress     Patient Stated   . PharmD "He cannot afford his medications" (pt-stated)       CARE PLAN ENTRY (see longtitudinal plan of care for additional care plan information)  Current Barriers:  . Polypharmacy; complex patient with  multiple comorbidities including atrial fibrillation, BPH, HLD o Received call back from patient's daughter confirming that Eliquis is very expensive for them - $141/90 . His daughter in law, [name], helps manage his medications. She notes that he has not been using a pill box . Most recent eGFR: ~50,  o Atrial Fibrillation: follows w/ Dr. Saralyn Pilar, daughter thinks they will be due for an appointment in ~September. Eliquis 5 mg BID, furosemide 40 mg BID, amiodarone 200 mg daily, metoprolol succinate 25 mg daily o HLD: simvastatin 20 mg, last LDL not at goal <70 o Suspected COPD (diagnosed at hospital admission 12/2018): has stopped using inhalers, patient reports his breathing is "just fine" without them  Pharmacist Clinical Goal(s):  Marland Kitchen Over the next 90 days, patient will work with PharmD and provider towards optimized medication management  Interventions: . Comprehensive medication review performed, medication list updated in electronic medical record . Inter-disciplinary care team collaboration (see longitudinal plan of care) . Reviewed income. [name] thinks that patient is over income for Medicare Extra Help, but will double check when my mail from yesterday arrives.  . Discussed Eliquis patient assistance program. Reviewed income requirement <$38,280 for a 1 person household, and out of pocket spend requirement of 3% of total yearly income on copays. Will type up these details for patient and his daughter when they come to the office on Friday.   Patient Self Care Activities:  . Patient will take medications as prescribed  Please  see past updates related to this goal by clicking on the "Past Updates" button in the selected goal          Plan:  - Will collaborate w/ patient and daughter as above  Catie Darnelle Maffucci, PharmD, [place] [phone]

## 2019-11-20 ENCOUNTER — Other Ambulatory Visit: Payer: Self-pay

## 2019-11-20 ENCOUNTER — Ambulatory Visit (INDEPENDENT_AMBULATORY_CARE_PROVIDER_SITE_OTHER): Payer: Medicare Other | Admitting: Family Medicine

## 2019-11-20 ENCOUNTER — Encounter: Payer: Self-pay | Admitting: Family Medicine

## 2019-11-20 VITALS — BP 130/70 | HR 57 | Temp 98.5°F | Ht 66.14 in | Wt 167.5 lb

## 2019-11-20 DIAGNOSIS — M109 Gout, unspecified: Secondary | ICD-10-CM | POA: Diagnosis not present

## 2019-11-20 DIAGNOSIS — E782 Mixed hyperlipidemia: Secondary | ICD-10-CM | POA: Diagnosis not present

## 2019-11-20 DIAGNOSIS — E876 Hypokalemia: Secondary | ICD-10-CM | POA: Diagnosis not present

## 2019-11-20 DIAGNOSIS — I10 Essential (primary) hypertension: Secondary | ICD-10-CM | POA: Diagnosis not present

## 2019-11-20 DIAGNOSIS — I4891 Unspecified atrial fibrillation: Secondary | ICD-10-CM

## 2019-11-20 NOTE — Progress Notes (Signed)
BP 130/70   Pulse (!) 57   Temp 98.5 F (36.9 C)   Ht 5' 6.14" (1.68 m)   Wt 167 lb 8 oz (76 kg)   SpO2 98%   BMI 26.92 kg/m    Subjective:    Patient ID: Caleb Platt., male    DOB: 11/15/1931, 86 y.o.   MRN: [id number]  HPI: Caleb Fleming is a 86 y.o. male  Chief Complaint  Patient presents with  . Hypertension   Having significant issues with his gout recently. Does not take anything for this at the moment, used to be on allopurinol. Right foot a month ago, then left foot and now left elbow. Each episode lasted about a week but now gone.   HTN, atrial fibrillation - Does not check vitals at home, states he wouldn't know how to work a BP cuff even if he had one. Denies CP, SOB, HAs, dizziness. Refusing to go back to Cardiology, does not wish to see numerous providers.   Hypokalemia - recent labs consistently showing hypokalemia, used to be on potassium supplement prior to d/c of all of his medications the last 6 months or so. Now back on medications pretty consistently.   Relevant past medical, surgical, family and social history reviewed and updated as indicated. Interim medical history since our last visit reviewed. Allergies and medications reviewed and updated.  Review of Systems  Per HPI unless specifically indicated above     Objective:    BP 130/70   Pulse (!) 57   Temp 98.5 F (36.9 C)   Ht 5' 6.14" (1.68 m)   Wt 167 lb 8 oz (76 kg)   SpO2 98%   BMI 26.92 kg/m   Wt Readings from Last 3 Encounters:  11/20/19 167 lb 8 oz (76 kg)  09/23/19 164 lb (74.4 kg)  08/12/19 165 lb (74.8 kg)    Physical Exam Vitals and nursing note reviewed.  Constitutional:      Appearance: Normal appearance.  HENT:     Head: Atraumatic.  Eyes:     Extraocular Movements: Extraocular movements intact.     Conjunctiva/sclera: Conjunctivae normal.  Cardiovascular:     Rate and Rhythm: Normal rate.  Pulmonary:     Effort: Pulmonary effort is normal.   Breath sounds: Normal breath sounds.  Musculoskeletal:        General: Normal range of motion.     Cervical back: Normal range of motion and neck supple.  Skin:    General: Skin is warm and dry.  Neurological:     General: No focal deficit present.     Mental Status: He is oriented to person, place, and time.  Psychiatric:        Mood and Affect: Mood normal.        Thought Content: Thought content normal.        Judgment: Judgment normal.     Results for orders placed or performed in visit on 11/20/19  Uric acid  Result Value Ref Range   Uric Acid 10.0 (H) 3.8 - 8.4 mg/dL  Comprehensive metabolic panel  Result Value Ref Range   Glucose 87 65 - 99 mg/dL   BUN 15 8 - 27 mg/dL   Creatinine, Ser 1.51 (H) 0.76 - 1.27 mg/dL   GFR calc non Af Amer 42 (L) >59 mL/min/1.73   GFR calc Af Amer 48 (L) >59 mL/min/1.73   BUN/Creatinine Ratio 10 10 - 24   Sodium 144 134 -  144 mmol/L   Potassium 3.1 (L) 3.5 - 5.2 mmol/L   Chloride 98 96 - 106 mmol/L   CO2 29 20 - 29 mmol/L   Calcium 9.2 8.6 - 10.2 mg/dL   Total Protein 6.2 6.0 - 8.5 g/dL   Albumin 4.2 3.6 - 4.6 g/dL   Globulin, Total 2.0 1.5 - 4.5 g/dL   Albumin/Globulin Ratio 2.1 1.2 - 2.2   Bilirubin Total 0.4 0.0 - 1.2 mg/dL   Alkaline Phosphatase 82 39 - 117 IU/L   AST 21 0 - 40 IU/L   ALT 10 0 - 44 IU/L  Lipid Panel w/o Chol/HDL Ratio  Result Value Ref Range   Cholesterol, Total 160 100 - 199 mg/dL   Triglycerides 92 0 - 149 mg/dL   HDL 66 >19 mg/dL   VLDL Cholesterol Cal 17 5 - 40 mg/dL   LDL Chol Calc (NIH) 77 0 - 99 mg/dL  CBC with Differential/Platelet  Result Value Ref Range   WBC 9.6 3.4 - 10.8 x10E3/uL   RBC 4.37 4.14 - 5.80 x10E6/uL   Hemoglobin 14.4 13.0 - 17.7 g/dL   Hematocrit 50.9 32.6 - 51.0 %   MCV 96 79 - 97 fL   MCH 33.0 26.6 - 33.0 pg   MCHC 34.2 31.5 - 35.7 g/dL   RDW 71.2 45.8 - 09.9 %   Platelets 311 150 - 450 x10E3/uL   Neutrophils 71 Not Estab. %   Lymphs 16 Not Estab. %   Monocytes 8 Not Estab.  %   Eos 3 Not Estab. %   Basos 1 Not Estab. %   Neutrophils Absolute 6.8 1.4 - 7.0 x10E3/uL   Lymphocytes Absolute 1.6 0.7 - 3.1 x10E3/uL   Monocytes Absolute 0.8 0.1 - 0.9 x10E3/uL   EOS (ABSOLUTE) 0.3 0.0 - 0.4 x10E3/uL   Basophils Absolute 0.1 0.0 - 0.2 x10E3/uL   Immature Granulocytes 1 Not Estab. %   Immature Grans (Abs) 0.1 0.0 - 0.1 x10E3/uL      Assessment & Plan:   Problem List Items Addressed This Visit      Cardiovascular and Mediastinum   Hypertension    Stable and under good control, continue current regimen      Relevant Medications   ASPIRIN EC PO   Other Relevant Orders   Comprehensive metabolic panel (Completed)   Atrial fibrillation with rapid ventricular response (HCC)    Refusing to go back to Cardiology though due for f/u. Appears to be under fairly good control currently, will continue on his medications      Relevant Medications   ASPIRIN EC PO   Other Relevant Orders   CBC with Differential/Platelet (Completed)     Other   Gout    Barring stable creatinine may need to restart low dose allopurinol. Also discussed tart cherry supplements and diet modifications for better gout control      Relevant Orders   Uric acid (Completed)   Hyperlipidemia - Primary    Recheck lipids. Initially stated not taking but he does think he's taking the medicine. Continue current regimen      Relevant Medications   ASPIRIN EC PO   Other Relevant Orders   Lipid Panel w/o Chol/HDL Ratio (Completed)    Other Visit Diagnoses    Hypokalemia       Recheck now that consistently back on medications and supplement if needed       Follow up plan: Return in about 6 months (around 05/22/2020) for 6 month f/u.

## 2019-11-20 NOTE — Patient Instructions (Addendum)
Can start taking tart cherry supplements to help with your gout    Gout  Gout is a condition that causes painful swelling of the joints. Gout is a type of inflammation of the joints (arthritis). This condition is caused by having too much uric acid in the body. Uric acid is a chemical that forms when the body breaks down substances called purines. Purines are important for building body proteins. When the body has too much uric acid, sharp crystals can form and build up inside the joints. This causes pain and swelling. Gout attacks can happen quickly and may be very painful (acute gout). Over time, the attacks can affect more joints and become more frequent (chronic gout). Gout can also cause uric acid to build up under the skin and inside the kidneys. What are the causes? This condition is caused by too much uric acid in your blood. This can happen because:  Your kidneys do not remove enough uric acid from your blood. This is the most common cause.  Your body makes too much uric acid. This can happen with some cancers and cancer treatments. It can also occur if your body is breaking down too many red blood cells (hemolytic anemia).  You eat too many foods that are high in purines. These foods include organ meats and some seafood. Alcohol, especially beer, is also high in purines. A gout attack may be triggered by trauma or stress. What increases the risk? You are more likely to develop this condition if you:  Have a family history of gout.  Are male and middle-aged.  Are male and have gone through menopause.  Are obese.  Frequently drink alcohol, especially beer.  Are dehydrated.  Lose weight too quickly.  Have an organ transplant.  Have lead poisoning.  Take certain medicines, including aspirin, cyclosporine, diuretics, levodopa, and niacin.  Have kidney disease.  Have a skin condition called psoriasis. What are the signs or symptoms? An attack of acute gout happens  quickly. It usually occurs in just one joint. The most common place is the big toe. Attacks often start at night. Other joints that may be affected include joints of the feet, ankle, knee, fingers, wrist, or elbow. Symptoms of this condition may include:  Severe pain.  Warmth.  Swelling.  Stiffness.  Tenderness. The affected joint may be very painful to touch.  Shiny, red, or purple skin.  Chills and fever. Chronic gout may cause symptoms more frequently. More joints may be involved. You may also have white or yellow lumps (tophi) on your hands or feet or in other areas near your joints. How is this diagnosed? This condition is diagnosed based on your symptoms, medical history, and physical exam. You may have tests, such as:  Blood tests to measure uric acid levels.  Removal of joint fluid with a thin needle (aspiration) to look for uric acid crystals.  X-rays to look for joint damage. How is this treated? Treatment for this condition has two phases: treating an acute attack and preventing future attacks. Acute gout treatment may include medicines to reduce pain and swelling, including:  NSAIDs.  Steroids. These are strong anti-inflammatory medicines that can be taken by mouth (orally) or injected into a joint.  Colchicine. This medicine relieves pain and swelling when it is taken soon after an attack. It can be given by mouth or through an IV. Preventive treatment may include:  Daily use of smaller doses of NSAIDs or colchicine.  Use of a medicine that  reduces uric acid levels in your blood.  Changes to your diet. You may need to see a dietitian about what to eat and drink to prevent gout. Follow these instructions at home: During a gout attack   If directed, put ice on the affected area: ? Put ice in a plastic bag. ? Place a towel between your skin and the bag. ? Leave the ice on for 20 minutes, 2-3 times a day.  Raise (elevate) the affected joint above the level of  your heart as often as possible.  Rest the joint as much as possible. If the affected joint is in your leg, you may be given crutches to use.  Follow instructions from your health care provider about eating or drinking restrictions. Avoiding future gout attacks  Follow a low-purine diet as told by your dietitian or health care provider. Avoid foods and drinks that are high in purines, including liver, kidney, anchovies, asparagus, herring, mushrooms, mussels, and beer.  Maintain a healthy weight or lose weight if you are overweight. If you want to lose weight, talk with your health care provider. It is important that you do not lose weight too quickly.  Start or maintain an exercise program as told by your health care provider. Eating and drinking  Drink enough fluids to keep your urine pale yellow.  If you drink alcohol: ? Limit how much you use to:  0-1 drink a day for women.  0-2 drinks a day for men. ? Be aware of how much alcohol is in your drink. In the [place] one drink equals one 12 oz bottle of beer (355 mL) one 5 oz glass of wine (148 mL), or one 1 oz glass of hard liquor (44 mL). General instructions  Take over-the-counter and prescription medicines only as told by your health care provider.  Do not drive or use heavy machinery while taking prescription pain medicine.  Return to your normal activities as told by your health care provider. Ask your health care provider what activities are safe for you.  Keep all follow-up visits as told by your health care provider. This is important. Contact a health care provider if you have:  Another gout attack.  Continuing symptoms of a gout attack after 10 days of treatment.  Side effects from your medicines.  Chills or a fever.  Burning pain when you urinate.  Pain in your lower back or belly. Get help right away if you:  Have severe or uncontrolled pain.  Cannot urinate. Summary  Gout is painful swelling of the  joints caused by inflammation.  The most common site of pain is the big toe, but it can affect other joints in the body.  Medicines and dietary changes can help to prevent and treat gout attacks. This information is not intended to replace advice given to you by your health care provider. Make sure you discuss any questions you have with your health care provider. Document Revised: 01/15/2018 Document Reviewed: 01/15/2018 Elsevier Patient Education  2020 Elsevier Inc.  Low-Purine Eating Plan A low-purine eating plan involves making food choices to limit your intake of purine. Purine is a kind of uric acid. Too much uric acid in your blood can cause certain conditions, such as gout and kidney stones. Eating a low-purine diet can help control these conditions. What are tips for following this plan? Reading food labels   Avoid foods with saturated or Trans fat.  Check the ingredient list of grains-based foods, such as bread and cereal,  to make sure that they contain whole grains.  Check the ingredient list of sauces or soups to make sure they do not contain meat or fish.  When choosing soft drinks, check the ingredient list to make sure they do not contain high-fructose corn syrup. Shopping  Buy plenty of fresh fruits and vegetables.  Avoid buying canned or fresh fish.  Buy dairy products labeled as low-fat or nonfat.  Avoid buying premade or processed foods. These foods are often high in fat, salt (sodium), and added sugar. Cooking  Use olive oil instead of butter when cooking. Oils like olive oil, canola oil, and sunflower oil contain healthy fats. Meal planning  Learn which foods do or do not affect you. If you find out that a food tends to cause your gout symptoms to flare up, avoid eating that food. You can enjoy foods that do not cause problems. If you have any questions about a food item, talk with your dietitian or health care provider.  Limit foods high in fat, especially  saturated fat. Fat makes it harder for your body to get rid of uric acid.  Choose foods that are lower in fat and are lean sources of protein. General guidelines  Limit alcohol intake to no more than 1 drink a day for nonpregnant women and 2 drinks a day for men. One drink equals 12 oz of beer, 5 oz of wine, or 1 oz of hard liquor. Alcohol can affect the way your body gets rid of uric acid.  Drink plenty of water to keep your urine clear or pale yellow. Fluids can help remove uric acid from your body.  If directed by your health care provider, take a vitamin C supplement.  Work with your health care provider and dietitian to develop a plan to achieve or maintain a healthy weight. Losing weight can help reduce uric acid in your blood. What foods are recommended? The items listed may not be a complete list. Talk with your dietitian about what dietary choices are best for you. Foods low in purines Foods low in purines do not need to be limited. These include:  All fruits.  All low-purine vegetables, pickles, and olives.  Breads, pasta, rice, cornbread, and popcorn. Cake and other baked goods.  All dairy foods.  Eggs, nuts, and nut butters.  Spices and condiments, such as salt, herbs, and vinegar.  Plant oils, butter, and margarine.  Water, sugar-free soft drinks, tea, coffee, and cocoa.  Vegetable-based soups, broths, sauces, and gravies. Foods moderate in purines Foods moderate in purines should be limited to the amounts listed.   cup of asparagus, cauliflower, spinach, mushrooms, or green peas, each day.  2/3 cup uncooked oatmeal, each day.   cup dry wheat bran or wheat germ, each day.  2-3 ounces of meat or poultry, each day.  4-6 ounces of shellfish, such as crab, lobster, oysters, or shrimp, each day.  1 cup cooked beans, peas, or lentils, each day.  Soup, broths, or bouillon made from meat or fish. Limit these foods as much as possible. What foods are not  recommended? The items listed may not be a complete list. Talk with your dietitian about what dietary choices are best for you. Limit your intake of foods high in purines, including:  Beer and other alcohol.  Meat-based gravy or sauce.  Canned or fresh fish, such as: ? Anchovies, sardines, herring, and tuna. ? Mussels and scallops. ? Codfish, trout, and haddock.  Berniece Salines.  Organ meats, such  as: ? Liver or kidney. ? Tripe. ? Sweetbreads (thymus gland or pancreas).  Wild Education officer, environmental.  Yeast or yeast extract supplements.  Drinks sweetened with high-fructose corn syrup. Summary  Eating a low-purine diet can help control conditions caused by too much uric acid in the body, such as gout or kidney stones.  Choose low-purine foods, limit alcohol, and limit foods high in fat.  You will learn over time which foods do or do not affect you. If you find out that a food tends to cause your gout symptoms to flare up, avoid eating that food. This information is not intended to replace advice given to you by your health care provider. Make sure you discuss any questions you have with your health care provider. Document Revised: 06/07/2017 Document Reviewed: 08/08/2016 Elsevier Patient Education  2020 [employer].

## 2019-11-21 LAB — COMPREHENSIVE METABOLIC PANEL
ALT: 10 IU/L (ref 0–44)
AST: 21 IU/L (ref 0–40)
Albumin/Globulin Ratio: 2.1 (ref 1.2–2.2)
Albumin: 4.2 g/dL (ref 3.6–4.6)
Alkaline Phosphatase: 82 IU/L (ref 39–117)
BUN/Creatinine Ratio: 10 (ref 10–24)
BUN: 15 mg/dL (ref 8–27)
Bilirubin Total: 0.4 mg/dL (ref 0.0–1.2)
CO2: 29 mmol/L (ref 20–29)
Calcium: 9.2 mg/dL (ref 8.6–10.2)
Chloride: 98 mmol/L (ref 96–106)
Creatinine, Ser: 1.51 mg/dL — ABNORMAL HIGH (ref 0.76–1.27)
GFR calc Af Amer: 48 mL/min/{1.73_m2} — ABNORMAL LOW (ref >59)
GFR calc non Af Amer: 42 mL/min/{1.73_m2} — ABNORMAL LOW (ref >59)
Globulin, Total: 2 g/dL (ref 1.5–4.5)
Glucose: 87 mg/dL (ref 65–99)
Potassium: 3.1 mmol/L — ABNORMAL LOW (ref 3.5–5.2)
Sodium: 144 mmol/L (ref 134–144)
Total Protein: 6.2 g/dL (ref 6.0–8.5)

## 2019-11-21 LAB — URIC ACID: Uric Acid: 10 mg/dL — ABNORMAL HIGH (ref 3.8–8.4)

## 2019-11-21 LAB — CBC WITH DIFFERENTIAL/PLATELET
Basophils Absolute: 0.1 10*3/uL (ref 0.0–0.2)
Basos: 1 %
EOS (ABSOLUTE): 0.3 10*3/uL (ref 0.0–0.4)
Eos: 3 %
Hematocrit: 42.1 % (ref 37.5–51.0)
Hemoglobin: 14.4 g/dL (ref 13.0–17.7)
Immature Grans (Abs): 0.1 10*3/uL (ref 0.0–0.1)
Immature Granulocytes: 1 %
Lymphocytes Absolute: 1.6 10*3/uL (ref 0.7–3.1)
Lymphs: 16 %
MCH: 33 pg (ref 26.6–33.0)
MCHC: 34.2 g/dL (ref 31.5–35.7)
MCV: 96 fL (ref 79–97)
Monocytes Absolute: 0.8 10*3/uL (ref 0.1–0.9)
Monocytes: 8 %
Neutrophils Absolute: 6.8 10*3/uL (ref 1.4–7.0)
Neutrophils: 71 %
Platelets: 311 10*3/uL (ref 150–450)
RBC: 4.37 x10E6/uL (ref 4.14–5.80)
RDW: 13.1 % (ref 11.6–15.4)
WBC: 9.6 10*3/uL (ref 3.4–10.8)

## 2019-11-21 LAB — LIPID PANEL W/O CHOL/HDL RATIO
Cholesterol, Total: 160 mg/dL (ref 100–199)
HDL: 66 mg/dL (ref >39)
LDL Chol Calc (NIH): 77 mg/dL (ref 0–99)
Triglycerides: 92 mg/dL (ref 0–149)
VLDL Cholesterol Cal: 17 mg/dL (ref 5–40)

## 2019-11-24 ENCOUNTER — Other Ambulatory Visit: Payer: Self-pay | Admitting: Family Medicine

## 2019-11-24 DIAGNOSIS — E876 Hypokalemia: Secondary | ICD-10-CM

## 2019-11-24 MED ORDER — POTASSIUM CHLORIDE ER 10 MEQ PO TBCR: 10.0000 meq | EXTENDED_RELEASE_TABLET | Freq: Two times a day (BID) | ORAL | 1 refills | Status: DC

## 2019-11-24 MED ORDER — ALLOPURINOL 100 MG PO TABS: 100.0000 mg | ORAL_TABLET | Freq: Every day | ORAL | 5 refills | Status: DC

## 2019-11-25 NOTE — Assessment & Plan Note (Signed)
Recheck lipids. Initially stated not taking but he does think he's taking the medicine. Continue current regimen

## 2019-11-25 NOTE — Assessment & Plan Note (Signed)
Stable and under good control, continue current regimen 

## 2019-11-25 NOTE — Assessment & Plan Note (Signed)
Refusing to go back to Cardiology though due for f/u. Appears to be under fairly good control currently, will continue on his medications

## 2019-11-25 NOTE — Assessment & Plan Note (Signed)
Barring stable creatinine may need to restart low dose allopurinol. Also discussed tart cherry supplements and diet modifications for better gout control

## 2019-11-27 ENCOUNTER — Telehealth: Payer: Self-pay

## 2019-11-27 ENCOUNTER — Ambulatory Visit: Payer: Self-pay | Admitting: General Practice

## 2019-11-27 NOTE — Chronic Care Management (AMB) (Signed)
°  Chronic Care Management   Outreach Note  11/27/2019 Name: Caleb Fleming. MRN: [id number] DOB: Jun 22, 1932  Referred by: Particia Nearing, PA-C Reason for referral : Chronic Care Management (Follow up: HTN/AFib/HLD/Anixeity/Depression- Hypokalemia and new concerns)   An unsuccessful telephone outreach was attempted today. The patient was referred to the case management team for assistance with care management and care coordination.   Follow Up Plan: A HIPPA compliant phone message was left for the patient providing contact information and requesting a return call.   [name] RN, MSN, CCM Community Care Coordinator [place]   [facility] Mobile: [phone]

## 2019-12-17 ENCOUNTER — Other Ambulatory Visit: Payer: Self-pay | Admitting: Family Medicine

## 2019-12-24 ENCOUNTER — Ambulatory Visit: Payer: Medicare Other | Admitting: Family Medicine

## 2020-01-05 ENCOUNTER — Other Ambulatory Visit: Payer: Self-pay

## 2020-01-07 ENCOUNTER — Telehealth: Payer: Self-pay | Admitting: Family Medicine

## 2020-01-07 NOTE — Telephone Encounter (Signed)
Copied from CRM [id number]. Topic: Medicare AWV >> Jan 07, 2020 11:58 AM Claudette Laws R wrote: Left message for patient to call back and schedule Medicare Annual Wellness Visit (AWV) to be done virtually.  No hx of AWV per [facility] 07/09/2009  Please schedule at anytime with CFP-Nurse Health Advisor.      45 Minutes appointment

## 2020-01-12 ENCOUNTER — Other Ambulatory Visit: Payer: Self-pay | Admitting: Family Medicine

## 2020-01-15 ENCOUNTER — Ambulatory Visit: Payer: Self-pay | Admitting: Pharmacist

## 2020-01-15 ENCOUNTER — Telehealth: Payer: Self-pay

## 2020-01-15 NOTE — Chronic Care Management (AMB) (Signed)
  Chronic Care Management   Note  01/15/2020 Name: Caleb Fleming. MRN: [id number] DOB: Jul 17, 1931  [name]. is a 86 y.o. year old male who is a primary care patient of Particia Nearing, [place]. The CCM team was consulted for assistance with chronic disease management and care coordination needs.    Attempted to contact patient for medication management review. Left HIPAA compliant message for patient to return my call at their convenience.   Plan: - Will collaborate with Care Guide to outreach to schedule follow up with me  Catie Feliz Beam, PharmD, [facility] Clinical Pharmacist [facility] [phone]

## 2020-01-18 ENCOUNTER — Telehealth: Payer: Self-pay | Admitting: Family Medicine

## 2020-01-18 NOTE — Chronic Care Management (AMB) (Signed)
  Care Management   Note  01/18/2020 Name: Orestes Geiman. MRN: [id number] DOB: 18-Jan-1932  Male Minish. is a 86 y.o. year old male who is a primary care patient of Particia Nearing, Cordelia Poche and is actively engaged with the care management team. I reached out to [name]. by phone today to assist with re-scheduling a follow up visit with the Pharmacist  Follow up plan: Telephone appointment with care management team member scheduled for:03/16/2020  Penne Lash, RMA Care Guide, Embedded Care Coordination [facility]  [place], [place] [address] Direct Dial: [phone] [email or web address]: [email or web address]

## 2020-01-25 DIAGNOSIS — Z20822 Contact with and (suspected) exposure to covid-19: Secondary | ICD-10-CM | POA: Diagnosis not present

## 2020-01-29 ENCOUNTER — Telehealth: Payer: Self-pay | Admitting: General Practice

## 2020-01-29 ENCOUNTER — Other Ambulatory Visit: Payer: Self-pay | Admitting: Family Medicine

## 2020-01-29 ENCOUNTER — Ambulatory Visit: Payer: Medicare Other | Attending: Internal Medicine

## 2020-01-29 ENCOUNTER — Telehealth: Payer: Self-pay

## 2020-01-29 DIAGNOSIS — Z23 Encounter for immunization: Secondary | ICD-10-CM

## 2020-01-29 NOTE — Telephone Encounter (Signed)
  Chronic Care Management   Outreach Note  01/29/2020 Name: Caleb Fleming. MRN: [id number] DOB: February 27, 1932  Referred by: Particia Nearing, PA-C Reason for referral : Appointment (RNCM Chronic Disease Management and Care Coordination Needs- 2nd attept)   A second unsuccessful telephone outreach was attempted today. The patient was referred to the case management team for assistance with care management and care coordination.   Follow Up Plan: A HIPPA compliant phone message was left for the patient providing contact information and requesting a return call.   [name] RN, MSN, CCM Community Care Coordinator [place]  [facility] Mobile: [phone]

## 2020-01-29 NOTE — Telephone Encounter (Signed)
Routing to provider  

## 2020-01-29 NOTE — Telephone Encounter (Signed)
Requested medication (s) are due for refill today - yes  Requested medication (s) are on the active medication list -yes  Future visit scheduled -no  Last refill: 11/03/19  Notes to clinic: Request for non delegated Rx  Requested Prescriptions  Pending Prescriptions Disp Refills   amiodarone (PACERONE) 200 MG tablet [Pharmacy Med Name: AMIODARONE HCL 200 MG TABLET] 90 tablet 0    Sig: TAKE 1 TABLET BY MOUTH EVERY DAY      Not Delegated - Cardiovascular: Antiarrhythmic Agents - amiodarone Failed - 01/29/2020  9:56 AM      Failed - This refill cannot be delegated      Failed - TSH in normal range and within 180 days    TSH  Date Value Ref Range Status  12/09/2018 1.608 0.350 - 4.500 uIU/mL Final    Comment:    Performed by a 3rd Generation assay with a functional sensitivity of <=0.01 uIU/mL. Performed at [facility], [address] [place], [place] [address]           Failed - Mg Level in normal range and within 180 days    Magnesium  Date Value Ref Range Status  12/09/2018 2.2 1.7 - 2.4 mg/dL Final    Comment:    Performed at [facility], [address] [place], [place] [address]          Failed - K in normal range and within 180 days    Potassium  Date Value Ref Range Status  11/20/2019 3.1 (L) 3.5 - 5.2 mmol/L Final          Failed - Patient had ECG in the last 180 days.      Passed - Ca in normal range and within 180 days    Calcium  Date Value Ref Range Status  11/20/2019 9.2 8.6 - 10.2 mg/dL Final          Passed - AST in normal range and within 180 days    AST  Date Value Ref Range Status  11/20/2019 21 0 - 40 IU/L Final          Passed - ALT in normal range and within 180 days    ALT  Date Value Ref Range Status  11/20/2019 10 0 - 44 IU/L Final          Passed - Last BP in normal range    BP Readings from Last 1 Encounters:  11/20/19 130/70          Passed - Last Heart Rate in normal range    Pulse Readings from  Last 1 Encounters:  11/20/19 (!) 57          Passed - Valid encounter within last 6 months    Recent Outpatient Visits           2 months ago Mixed hyperlipidemia   [facility], [place], [place]   4 months ago Mixed hyperlipidemia   [facility] Roosvelt Maser [place], [place]   5 months ago Essential hypertension   [facility] Ro[facility]velt Maser [place], [place]   1 year ago Essential hypertension   [facility] Ro[facility]velt Maser [place], [place]   1 year ago Atrial fibrillation with rapid ventricular response [facility])   [facility] Roosvelt Maser [place], [place]               Signed Prescriptions Disp Refills   metoprolol succinate (TOPROL-XL) 25 MG 24 hr tablet 90 tablet 0  Sig: TAKE 1 TABLET BY MOUTH EVERY DAY      Cardiovascular:  Beta Blockers Passed - 01/29/2020  9:56 AM      Passed - Last BP in normal range    BP Readings from Last 1 Encounters:  11/20/19 130/70          Passed - Last Heart Rate in normal range    Pulse Readings from Last 1 Encounters:  11/20/19 (!) 57          Passed - Valid encounter within last 6 months    Recent Outpatient Visits           2 months ago Mixed hyperlipidemia   [facility], [place], [place]   4 months ago Mixed hyperlipidemia   [facility] Ro[facility] Maser [place], [place]   5 months ago Essential hypertension   [facility] Particia Nearing, [place]   1 year ago Essential hypertension   [facility] Ro[facility]velt Maser [place], [place]   1 year ago Atrial fibrillation with rapid ventricular response [facility])   [facility] [place], [place]                  Requested Prescriptions  Pending Prescriptions Disp Refills   amiodarone (PACERONE) 200 MG tablet [Pharmacy Med Name: AMIODARONE HCL 200 MG TABLET] 90 tablet 0    Sig: TAKE 1 TABLET BY MOUTH EVERY DAY      Not Delegated -  Cardiovascular: Antiarrhythmic Agents - amiodarone Failed - 01/29/2020  9:56 AM      Failed - This refill cannot be delegated      Failed - TSH in normal range and within 180 days    TSH  Date Value Ref Range Status  12/09/2018 1.608 0.350 - 4.500 uIU/mL Final    Comment:    Performed by a 3rd Generation assay with a functional sensitivity of <=0.01 uIU/mL. Performed at [facility], [address] [place], [place] [address]           Failed - Mg Level in normal range and within 180 days    Magnesium  Date Value Ref Range Status  12/09/2018 2.2 1.7 - 2.4 mg/dL Final    Comment:    Performed at [facility], [address] [place], [place] [address]          Failed - K in normal range and within 180 days    Potassium  Date Value Ref Range Status  11/20/2019 3.1 (L) 3.5 - 5.2 mmol/L Final          Failed - Patient had ECG in the last 180 days.      Passed - Ca in normal range and within 180 days    Calcium  Date Value Ref Range Status  11/20/2019 9.2 8.6 - 10.2 mg/dL Final          Passed - AST in normal range and within 180 days    AST  Date Value Ref Range Status  11/20/2019 21 0 - 40 IU/L Final          Passed - ALT in normal range and within 180 days    ALT  Date Value Ref Range Status  11/20/2019 10 0 - 44 IU/L Final          Passed - Last BP in normal range    BP Readings from Last 1 Encounters:  11/20/19 130/70  Passed - Last Heart Rate in normal range    Pulse Readings from Last 1 Encounters:  11/20/19 (!) 57          Passed - Valid encounter within last 6 months    Recent Outpatient Visits           2 months ago Mixed hyperlipidemia   [facility] Ro[facility]velt Maser [place], [place]   4 months ago Mixed hyperlipidemia   [facility] Roosvelt Maser [place], [place]   5 months ago Essential hypertension   [facility] Particia Nearing, [place]   1 year ago Essential hypertension    [facility] Roosvelt Maser [place], [place]   1 year ago Atrial fibrillation with rapid ventricular response [facility])   [facility] Roosvelt Maser [place], [place]               Signed Prescriptions Disp Refills   metoprolol succinate (TOPROL-XL) 25 MG 24 hr tablet 90 tablet 0    Sig: TAKE 1 TABLET BY MOUTH EVERY DAY      Cardiovascular:  Beta Blockers Passed - 01/29/2020  9:56 AM      Passed - Last BP in normal range    BP Readings from Last 1 Encounters:  11/20/19 130/70          Passed - Last Heart Rate in normal range    Pulse Readings from Last 1 Encounters:  11/20/19 (!) 57          Passed - Valid encounter within last 6 months    Recent Outpatient Visits           2 months ago Mixed hyperlipidemia   [facility] [place], [place], [place]   4 months ago Mixed hyperlipidemia   [facility] Ro[facility] Maser [place], [place]   5 months ago Essential hypertension   [facility] Roosvelt Maser [place], [place]   1 year ago Essential hypertension   [facility] Maser [place], [place]   1 year ago Atrial fibrillation with rapid ventricular response [facility])   [facility] Particia Nearing, [place]

## 2020-01-29 NOTE — Progress Notes (Signed)
   [facility]  Name:  Caleb Fleming.    MRN: [id number] DOB: [birth year]  01/29/2020  [name] was observed post Covid-19 immunization for 15 minutes without incident. He was provided with Vaccine Information Sheet and instruction to access the V-Safe system.   [name] was instructed to call 911 with any severe reactions post vaccine: Marland Kitchen Difficulty breathing  . Swelling of face and throat  . A fast heartbeat  . A bad rash all over body  . Dizziness and weakness   Immunizations Administered    Name Date Dose VIS Date Route   Pfizer COVID-19 Vaccine 01/29/2020 11:50 AM 0.3 mL 09/02/2018 Intramuscular   Manufacturer: [employer], [employer]   Lot: [id number]   NDC: [address]-2

## 2020-02-01 ENCOUNTER — Telehealth: Payer: Self-pay | Admitting: *Deleted

## 2020-02-01 NOTE — Chronic Care Management (AMB) (Signed)
  Chronic Care Management   Note  02/01/2020 Name: Caleb Fleming. MRN: [id number] DOB: 1932-03-30  Caleb Fleming. is a 86 y.o. year old male who is a primary care patient of Particia Nearing, Cordelia Poche and is actively engaged with the care management team. I reached out to [name]. by phone today to assist with re-scheduling an initial visit with the RN Case Manager.  Follow up plan: Unsuccessful telephone outreach attempt made. A HIPPA compliant phone message was left for the patient providing contact information and requesting a return call.  The care management team will reach out to the patient again over the next 7 days.  If patient returns call to provider office, please advise to call Embedded Care Management Care Guide Gwenevere Ghazi at [phone].  Gwenevere Ghazi  Care Guide, Embedded Care Coordination [facility]  [place], [place] [address] Direct Dial: [phone] [email or web address]: [email or web address]

## 2020-02-10 ENCOUNTER — Other Ambulatory Visit: Payer: Self-pay

## 2020-02-10 ENCOUNTER — Emergency Department: Payer: Medicare Other

## 2020-02-10 ENCOUNTER — Inpatient Hospital Stay
Admission: EM | Admit: 2020-02-10 | Discharge: 2020-02-15 | DRG: 178 | Disposition: A | Discharge status: Home / Self Care | Payer: Medicare Other | Attending: Internal Medicine | Admitting: Internal Medicine

## 2020-02-10 ENCOUNTER — Ambulatory Visit (INDEPENDENT_AMBULATORY_CARE_PROVIDER_SITE_OTHER): Payer: Medicare Other | Admitting: General Practice

## 2020-02-10 DIAGNOSIS — J320 Chronic maxillary sinusitis: Secondary | ICD-10-CM | POA: Diagnosis not present

## 2020-02-10 DIAGNOSIS — I129 Hypertensive chronic kidney disease with stage 1 through stage 4 chronic kidney disease, or unspecified chronic kidney disease: Secondary | ICD-10-CM | POA: Diagnosis present

## 2020-02-10 DIAGNOSIS — N1832 Chronic kidney disease, stage 3b: Secondary | ICD-10-CM | POA: Diagnosis present

## 2020-02-10 DIAGNOSIS — Z8616 Personal history of COVID-19: Secondary | ICD-10-CM

## 2020-02-10 DIAGNOSIS — E782 Mixed hyperlipidemia: Secondary | ICD-10-CM

## 2020-02-10 DIAGNOSIS — U071 COVID-19: Secondary | ICD-10-CM | POA: Diagnosis not present

## 2020-02-10 DIAGNOSIS — I10 Essential (primary) hypertension: Secondary | ICD-10-CM | POA: Diagnosis not present

## 2020-02-10 DIAGNOSIS — I4891 Unspecified atrial fibrillation: Secondary | ICD-10-CM | POA: Diagnosis not present

## 2020-02-10 DIAGNOSIS — R0602 Shortness of breath: Secondary | ICD-10-CM

## 2020-02-10 DIAGNOSIS — N179 Acute kidney failure, unspecified: Secondary | ICD-10-CM

## 2020-02-10 DIAGNOSIS — Z888 Allergy status to other drugs, medicaments and biological substances status: Secondary | ICD-10-CM | POA: Diagnosis not present

## 2020-02-10 DIAGNOSIS — G4700 Insomnia, unspecified: Secondary | ICD-10-CM | POA: Diagnosis present

## 2020-02-10 DIAGNOSIS — Z885 Allergy status to narcotic agent status: Secondary | ICD-10-CM

## 2020-02-10 DIAGNOSIS — F419 Anxiety disorder, unspecified: Secondary | ICD-10-CM | POA: Diagnosis present

## 2020-02-10 DIAGNOSIS — I480 Paroxysmal atrial fibrillation: Secondary | ICD-10-CM | POA: Diagnosis not present

## 2020-02-10 DIAGNOSIS — Z7901 Long term (current) use of anticoagulants: Secondary | ICD-10-CM

## 2020-02-10 DIAGNOSIS — R531 Weakness: Secondary | ICD-10-CM | POA: Diagnosis not present

## 2020-02-10 DIAGNOSIS — Z7982 Long term (current) use of aspirin: Secondary | ICD-10-CM | POA: Diagnosis not present

## 2020-02-10 DIAGNOSIS — R05 Cough: Secondary | ICD-10-CM | POA: Diagnosis not present

## 2020-02-10 DIAGNOSIS — R21 Rash and other nonspecific skin eruption: Secondary | ICD-10-CM | POA: Diagnosis not present

## 2020-02-10 DIAGNOSIS — Z720 Tobacco use: Secondary | ICD-10-CM | POA: Diagnosis not present

## 2020-02-10 DIAGNOSIS — G319 Degenerative disease of nervous system, unspecified: Secondary | ICD-10-CM | POA: Diagnosis not present

## 2020-02-10 DIAGNOSIS — Z79899 Other long term (current) drug therapy: Secondary | ICD-10-CM

## 2020-02-10 DIAGNOSIS — J3489 Other specified disorders of nose and nasal sinuses: Secondary | ICD-10-CM | POA: Diagnosis not present

## 2020-02-10 DIAGNOSIS — Z20822 Contact with and (suspected) exposure to covid-19: Secondary | ICD-10-CM

## 2020-02-10 DIAGNOSIS — E785 Hyperlipidemia, unspecified: Secondary | ICD-10-CM | POA: Diagnosis not present

## 2020-02-10 DIAGNOSIS — M109 Gout, unspecified: Secondary | ICD-10-CM | POA: Diagnosis present

## 2020-02-10 DIAGNOSIS — G9389 Other specified disorders of brain: Secondary | ICD-10-CM | POA: Diagnosis not present

## 2020-02-10 DIAGNOSIS — N400 Enlarged prostate without lower urinary tract symptoms: Secondary | ICD-10-CM | POA: Diagnosis present

## 2020-02-10 DIAGNOSIS — F329 Major depressive disorder, single episode, unspecified: Secondary | ICD-10-CM | POA: Diagnosis present

## 2020-02-10 LAB — CBC
HCT: 41.2 % (ref 39.0–52.0)
Hemoglobin: 14.2 g/dL (ref 13.0–17.0)
MCH: 32.3 pg (ref 26.0–34.0)
MCHC: 34.5 g/dL (ref 30.0–36.0)
MCV: 93.6 fL (ref 80.0–100.0)
Platelets: 364 10*3/uL (ref 150–400)
RBC: 4.4 MIL/uL (ref 4.22–5.81)
RDW: 13.5 % (ref 11.5–15.5)
WBC: 10.1 10*3/uL (ref 4.0–10.5)
nRBC: 0 % (ref 0.0–0.2)

## 2020-02-10 LAB — BASIC METABOLIC PANEL
Anion gap: 13 (ref 5–15)
BUN: 51 mg/dL — ABNORMAL HIGH (ref 8–23)
CO2: 24 mmol/L (ref 22–32)
Calcium: 8.5 mg/dL — ABNORMAL LOW (ref 8.9–10.3)
Chloride: 100 mmol/L (ref 98–111)
Creatinine, Ser: 2.37 mg/dL — ABNORMAL HIGH (ref 0.61–1.24)
GFR calc Af Amer: 28 mL/min — ABNORMAL LOW (ref >60)
GFR calc non Af Amer: 24 mL/min — ABNORMAL LOW (ref >60)
Glucose, Bld: 111 mg/dL — ABNORMAL HIGH (ref 70–99)
Potassium: 3.5 mmol/L (ref 3.5–5.1)
Sodium: 137 mmol/L (ref 135–145)

## 2020-02-10 LAB — TROPONIN I (HIGH SENSITIVITY): Troponin I (High Sensitivity): 5 ng/L (ref <18)

## 2020-02-10 MED ORDER — LACTATED RINGERS IV BOLUS
1000.0000 mL | Freq: Once | INTRAVENOUS | Status: AC
  Administered 2020-02-11: 1000 mL via INTRAVENOUS

## 2020-02-10 NOTE — ED Notes (Signed)
Pt states coming in due to headache and a cough. Pt denies difficulty breathing. Pt states feeling ill since his first covid-19 shot about 2 weeks ago.

## 2020-02-10 NOTE — ED Triage Notes (Signed)
Pt comes POV with headache, cough, weakness since he got his shot a week ago. Pt states that his son had it right before he got his shot. Pt did test negative before shot.

## 2020-02-10 NOTE — Chronic Care Management (AMB) (Signed)
  Chronic Care Management   Note  02/10/2020 Name: Travious Vanover. MRN: [id number] DOB: [birth year]  Jevin Camino. is a 86 y.o. year old male who is a primary care patient of Particia Nearing, Cordelia Poche and is actively engaged with the care management team. I reached out to [name]. by phone today to assist with re-scheduling an initial visit with the RN Case Manager. Patient daughter in law [name] states that she thinks patient Dawud has COVID explained to [name] I am not in office at [facility] that she would need to call directly into the office for advice or call 911 if she thought he was having a medical emergency. She understood and thanked me for the call.    Follow up plan: Telephone appointment with care management team member scheduled for: 03/11/2020  [facility] Guide, Embedded Care Coordination [facility]  [place], [place] [address] Direct Dial: [phone] [email or web address]: [email or web address]

## 2020-02-10 NOTE — Chronic Care Management (AMB) (Signed)
Chronic Care Management   Follow Up Note   02/10/2020 Name: Caleb Fleming. MRN: [id number] DOB: 01-Dec-1931  Referred by: Particia Nearing, PA-C Reason for referral : Chronic Care Management (Assistance needed from the [facility]- Care guide and phone triage states family needs guidance on what to do. )   Caleb Fleming. is a 86 y.o. year old male who is a primary care patient of Particia Nearing, PA-C. The CCM team was consulted for assistance with chronic disease management and care coordination needs.    Review of patient status, including review of consultants reports, relevant laboratory and other test results, and collaboration with appropriate care team members and the patient's provider was performed as part of comprehensive patient evaluation and provision of chronic care management services.    SDOH (Social Determinants of Health) assessments performed: Yes See Care Plan activities for detailed interventions related to [facility])     Outpatient Encounter Medications as of 02/10/2020  Medication Sig  . amiodarone (PACERONE) 200 MG tablet TAKE 1 TABLET BY MOUTH EVERY DAY  . acetaminophen (TYLENOL) 325 MG tablet Take 650 mg by mouth every 4 (four) hours as needed.  Marland Kitchen allopurinol (ZYLOPRIM) 100 MG tablet Take 1 tablet (100 mg total) by mouth daily.  Marland Kitchen apixaban (ELIQUIS) 5 MG TABS tablet Take 1 tablet (5 mg total) by mouth 2 (two) times daily.  . ASPIRIN EC PO Take 81 mg by mouth daily.  . furosemide (LASIX) 40 MG tablet Take 1 tablet (40 mg total) by mouth 2 (two) times daily.  . metoprolol succinate (TOPROL-XL) 25 MG 24 hr tablet TAKE 1 TABLET BY MOUTH EVERY DAY  . potassium chloride (KLOR-CON) 10 MEQ tablet TAKE 1 TABLET (10 MEQ TOTAL) BY MOUTH 2 (TWO) TIMES DAILY.  . simvastatin (ZOCOR) 20 MG tablet Take 1 tablet (20 mg total) by mouth daily at 6 PM. (Patient not taking: Reported on 11/20/2019)   No facility-administered encounter medications on file as of 02/10/2020.       Objective:  BP Readings from Last 3 Encounters:  11/20/19 130/70  09/23/19 (!) 146/83  02/04/19 108/77    Goals Addressed              This Visit's Progress   .  RNCM- Pt's daughter in law: "I am sure he has COVID" "His breathing is getting worse" (pt-stated)        CARE PLAN ENTRY (see longtitudinal plan of care for additional care plan information)  Current Barriers:  Marland Kitchen Knowledge Deficits related to COVID-19 and impact on patient self health management . Son and daughter in law have tested positive for COVID 19 and the patient lives in the home with them.  . Decline in the patients health over the last several days. Patient per the daughter in law [name] has declined going to the ER . Daughter in law [name] verbalized the patient is "getting sicker and his breathing is worse" . Patient refusing for family to call 911  Clinical Goal(s):  Marland Kitchen Over the next 30 days, patient will verbalize basic understanding of COVID-19 impact on individual health and self health management as evidenced by verbalization of basic understanding of COVID-19 as a viral disease, measures to prevent exposure, signs and symptoms, when to contact provider . Over the next 4 hours the patient will be evaluated and treated in the ER for worsening sx/sx of suspected COVID19  Interventions: . Evaluation of current sx/sx.  Per [name] the daughter in law the  patient has been getting worse by the day and pleas for him to go to the ER have not been successful. The son and daughter in law both have tested positive for COVID19 and suspect the patient has COVID 19 also.  The daughter in law says he is having increased shortness of breath. Educated on the importance of the patient being evaluated immediately for worsening sx/sx expressed. The daughter in law states that as soon as she takes her grandkids home she will take the patient to the ER. Explained with urgency this is not something that could wait and the patient needed  to go to the hospital by 911 services. Spoke directly to the patient and advised the patient the concern of his worsening sx/sx and the need to be evaluated. The patient states he is not going to ride the ambulance to the hospital. Agreed to let the daughter in law take him to the hospital.   . Advised patient  to the call 911 and be evaluated in the ER immediately due to decline in breathing and worsening condition.  . Provided education to patient re: urgency of evaluation and treatment due to worsening sx/sx and how serious the COVID 19 virus is on the elderly population and with his chronic health conditions it puts him at higher risk of complications including death if untreated. The daughter in law [name] was going to take the patient to the hospital and ended the call. Will continue to monitor for changes and assistance as needed.   Patient Self Care Activities:  . Patient verbalizes understanding of plan to be evaluated for worsening sx/sx related to possible COVID19. . Calls provider office for new concerns or questions . Call 911 or go directly to the ER for evaluation and treatment  Initial goal documentation                   .  RNCM: He does not have a way of checking his blood pressure, we still can not afford 2 of his medications        CARE PLAN ENTRY (see longtitudinal plan of care for additional care plan information)  Current Barriers:  . Chronic Disease Management support, education, and care coordination needs related to Atrial Fibrillation, HTN, and HLD . Financial Constraints  Clinical Goal(s) related to Atrial Fibrillation, HTN, and HLD:  Over the next 90 days, patient will:  . Work with the care management team to address educational, disease management, and care coordination needs  . Begin or continue self health monitoring activities as directed today Measure and record blood pressure 4 times per week and take medications as prescribed  . Call provider  office for new or worsened signs and symptoms Blood pressure findings outside established parameters and New or worsened symptom related to HLD and AFIB . Call care management team with questions or concerns . Verbalize basic understanding of patient centered plan of care established today . Over the next 90 days, Daughter in law will call [facility] and talk to customer service about the over the counter benefit and life alert system for the patient to get blood pressure cuff and system to help with safety  Interventions related to Atrial Fibrillation, HTN, and HLD:  . Evaluation of current treatment plans and patient's adherence to plan as established by provider- per the daughter in law the patient is still not taking the Metoprolol and Zocor due to affordability One is >$100 and the other is $47.00 . Assessed patient understanding  of disease states- daughter in law helps the patient manage his care . Assessed patient's education and care coordination needs- denies the need for social work at this time but would like to talk to the pharmacist to assist with cost and resources to help pay for medications . Provided disease specific education to patient: Heart Healthy diet options, taking medications as prescribed, education and support on checking and recording blood pressure daily for optimal management of HTN . Collaborated with appropriate clinical care team members regarding patient needs . Educated the daughter in Social worker to call  [facility] customer service number on the back of the patients insurance card and request the OTC catalog to be sent and information on obtaining the [employer] system free of charge . Sent EMAIL to [email or web address] so the daughter in law can sign up for myChart and see lab work, communicate with the healthcare team and educational information- 02-10-2020: Has not signed up yet. Will discuss further as today's call was an urgent call for decline in condition.   Patient  Self Care Activities related to Atrial Fibrillation, HTN, and HLD:  . Patient is unable to independently self-manage chronic health conditions  Please see past updates related to this goal by clicking on the "Past Updates" button in the selected goal          Plan:   The care management team will reach out to the patient again over the next 2 to 14  days.    [name] RN, MSN, CCM Community Care Coordinator [place]  [facility] Mobile: [phone]

## 2020-02-10 NOTE — ED Provider Notes (Signed)
[facility] Emergency Department Provider Note   ____________________________________________   First MD Initiated Contact with Patient 02/10/20 2144     (approximate)  I have reviewed the triage vital signs and the nursing notes.   HISTORY  Chief Complaint Headache and Cough    HPI Caleb Fleming. is a 86 y.o. male with past medical history of hypertension, hyperlipidemia, and gout who presents to the ED complaining of cough and headache.  Patient reports he has been having approximately 1 week of diffuse headache, cough, and generalized weakness.  He states that symptoms began around the time he got his first COVID-19 vaccination.  He does state that his son and daughter-in-law tested positive for COVID-19 about 2 weeks ago.  He was tested for COVID-19 at that time, which was negative.  He states he has been feeling gradually worse since then with poor p.o. intake.  He denies any fevers, chest pain, vomiting, or diarrhea.        Past Medical History:  Diagnosis Date  . Anxiety   . BPH (benign prostatic hypertrophy)   . Depression   . ED (erectile dysfunction)   . Gout   . Hyperlipidemia   . Hypertension   . Insomnia     Patient Active Problem List   Diagnosis Date Noted  . Atrial fibrillation with rapid ventricular response (HCC) 12/08/2018  . Benign prostatic hyperplasia 05/26/2015  . Gout 05/26/2015  . Hyperlipidemia 05/26/2015  . Insomnia 05/26/2015  . Hypogonadism in male 05/26/2015  . Hypertension 05/26/2015  . Anxiety and depression 05/26/2015  . Cellulitis of left upper extremity 05/06/2015  . Laceration of left forearm 05/06/2015  . Rash 05/06/2015    History reviewed. No pertinent surgical history.  Prior to Admission medications   Medication Sig Start Date End Date Taking? Authorizing Provider  amiodarone (PACERONE) 200 MG tablet TAKE 1 TABLET BY MOUTH EVERY DAY 01/29/20   Particia Nearing, PA-C  acetaminophen  (TYLENOL) 325 MG tablet Take 650 mg by mouth every 4 (four) hours as needed. 05/09/15   [provider]  allopurinol (ZYLOPRIM) 100 MG tablet Take 1 tablet (100 mg total) by mouth daily. 11/24/19   Particia Nearing, PA-C  apixaban (ELIQUIS) 5 MG TABS tablet Take 1 tablet (5 mg total) by mouth 2 (two) times daily. 09/23/19   Particia Nearing, PA-C  ASPIRIN EC PO Take 81 mg by mouth daily.    [provider]  furosemide (LASIX) 40 MG tablet Take 1 tablet (40 mg total) by mouth 2 (two) times daily. 08/12/19 08/11/20  Particia Nearing, PA-C  metoprolol succinate (TOPROL-XL) 25 MG 24 hr tablet TAKE 1 TABLET BY MOUTH EVERY DAY 01/29/20   Particia Nearing, PA-C  potassium chloride (KLOR-CON) 10 MEQ tablet TAKE 1 TABLET (10 MEQ TOTAL) BY MOUTH 2 (TWO) TIMES DAILY. 12/17/19   Particia Nearing, PA-C  simvastatin (ZOCOR) 20 MG tablet Take 1 tablet (20 mg total) by mouth daily at 6 PM. Patient not taking: Reported on 11/20/2019 09/23/19   Particia Nearing, PA-C    Allergies Bactrim [sulfamethoxazole-trimethoprim] and Codeine  Family History  Problem Relation Age of Onset  . Cancer Mother   . Cancer Father     Social History Social History   Tobacco Use  . Smoking status: Former Games developer  . Smokeless tobacco: Current User    Types: Chew  Substance Use Topics  . Alcohol use: Yes    Alcohol/week: 49.0 standard drinks  Types: 49 Cans of beer per week  . Drug use: Never    Review of Systems  Constitutional: No fever/chills.  Positive for fatigue and generalized weakness. Eyes: No visual changes. ENT: No sore throat. Cardiovascular: Denies chest pain. Respiratory: Denies shortness of breath.  Positive for cough. Gastrointestinal: No abdominal pain.  No nausea, no vomiting.  No diarrhea.  No constipation. Genitourinary: Negative for dysuria. Musculoskeletal: Negative for back pain. Skin: Negative for rash. Neurological: Positive for headaches,  negative for focal weakness or numbness.  ____________________________________________   PHYSICAL EXAM:  VITAL SIGNS: ED Triage Vitals  Enc Vitals Group     BP 02/10/20 1728 137/63     Pulse Rate 02/10/20 1728 (!) 57     Resp 02/10/20 1728 18     Temp 02/10/20 1728 98.3 F (36.8 C)     Temp Source 02/10/20 1728 Oral     SpO2 02/10/20 1728 100 %     Weight 02/10/20 1730 160 lb (72.6 kg)     Height 02/10/20 1730 5\' 7"  (1.702 m)     Head Circumference --      Peak Flow --      Pain Score 02/10/20 1730 0     Pain Loc --      Pain Edu? --      Excl. in GC? --     Constitutional: Alert and oriented. Eyes: Conjunctivae are normal. Head: Atraumatic. Nose: No congestion/rhinnorhea. Mouth/Throat: Mucous membranes are dry. Neck: Normal ROM Cardiovascular: Normal rate, regular rhythm. Grossly normal heart sounds. Respiratory: Normal respiratory effort.  No retractions. Lungs CTAB. Gastrointestinal: Soft and nontender. No distention. Genitourinary: deferred Musculoskeletal: No lower extremity tenderness nor edema. Neurologic:  Normal speech and language. No gross focal neurologic deficits are appreciated. Skin:  Skin is warm, dry and intact. No rash noted. Psychiatric: Mood and affect are normal. Speech and behavior are normal.  ____________________________________________   LABS (all labs ordered are listed, but only abnormal results are displayed)  Labs Reviewed  BASIC METABOLIC PANEL - Abnormal; Notable for the following components:      Result Value   Glucose, Bld 111 (*)    BUN 51 (*)    Creatinine, Ser 2.37 (*)    Calcium 8.5 (*)    GFR calc non Af Amer 24 (*)    GFR calc Af Amer 28 (*)    All other components within normal limits  SARS CORONAVIRUS 2 BY RT PCR (HOSPITAL ORDER, PERFORMED IN [facility])  CBC  CK  POC SARS CORONAVIRUS 2 AG -  ED  TROPONIN I (HIGH SENSITIVITY)   ____________________________________________  EKG  ED ECG REPORT I,  04/11/20, the attending physician, personally viewed and interpreted this ECG.   Date: 02/11/2020  EKG Time: 17:33  Rate: 65  Rhythm: normal sinus rhythm  Axis: Normal  Intervals:none  ST&T Change: None   PROCEDURES  Procedure(s) performed (including Critical Care):  Procedures   ____________________________________________   INITIAL IMPRESSION / ASSESSMENT AND PLAN / ED COURSE       86 year old male with past medical history of hypertension, hyperlipidemia, and gout presents to the ED complaining of generalized weakness, malaise, headache, and cough for about the past week.  He was recently exposed to COVID-19 by his family members, additionally recently had his first vaccination, but has not yet fully vaccinated.  He is afebrile here in the ED with reassuring vital signs, no focal deficits noted on exam.  CT head is negative for  acute process and EKG is unremarkable.  Lab work is remarkable for AKI, likely due to dehydration given his poor p.o. intake and elevated BUN to creatinine ratio.  We will hydrate with IV fluids and admit with COVID-19 testing pending.      ____________________________________________   FINAL CLINICAL IMPRESSION(S) / ED DIAGNOSES  Final diagnoses:  AKI (acute kidney injury) (HCC)  Suspected COVID-19 virus infection     ED Discharge Orders    None       Note:  This document was prepared using Dragon voice recognition software and may include unintentional dictation errors.   Chesley Noon, MD 02/11/20 [phone]

## 2020-02-10 NOTE — Patient Instructions (Signed)
Visit Information  Goals Addressed              This Visit's Progress     RNCM- Pt's daughter in law: "I am sure he has COVID" "His breathing is getting worse" (pt-stated)        CARE PLAN ENTRY (see longtitudinal plan of care for additional care plan information)  Current Barriers:   Knowledge Deficits related to COVID-19 and impact on patient self health management  Son and daughter in law have tested positive for COVID 19 and the patient lives in the home with them.   Decline in the patients health over the last several days. Patient per the daughter in law [name] has declined going to the ER  Daughter in law [name] verbalized the patient is "getting sicker and his breathing is worse"  Patient refusing for family to call 911  Clinical Goal(s):   Over the next 30 days, patient will verbalize basic understanding of COVID-19 impact on individual health and self health management as evidenced by verbalization of basic understanding of COVID-19 as a viral disease, measures to prevent exposure, signs and symptoms, when to contact provider  Over the next 4 hours the patient will be evaluated and treated in the ER for worsening sx/sx of suspected COVID19  Interventions:  Evaluation of current sx/sx.  Per [name] the daughter in law the patient has been getting worse by the day and pleas for him to go to the ER have not been successful. The son and daughter in law both have tested positive for COVID19 and suspect the patient has COVID 19 also.  The daughter in law says he is having increased shortness of breath. Educated on the importance of the patient being evaluated immediately for worsening sx/sx expressed. The daughter in law states that as soon as she takes her grandkids home she will take the patient to the ER. Explained with urgency this is not something that could wait and the patient needed to go to the hospital by 911 services. Spoke directly to the patient and advised the patient the  concern of his worsening sx/sx and the need to be evaluated. The patient states he is not going to ride the ambulance to the hospital. Agreed to let the daughter in law take him to the hospital.    Advised patient  to the call 911 and be evaluated in the ER immediately due to decline in breathing and worsening condition.   Provided education to patient re: urgency of evaluation and treatment due to worsening sx/sx and how serious the COVID 19 virus is on the elderly population and with his chronic health conditions it puts him at higher risk of complications including death if untreated. The daughter in law [name] was going to take the patient to the hospital and ended the call. Will continue to monitor for changes and assistance as needed.   Patient Self Care Activities:   Patient verbalizes understanding of plan to be evaluated for worsening sx/sx related to possible COVID19.  Calls provider office for new concerns or questions  Call 911 or go directly to the ER for evaluation and treatment  Initial goal documentation                     RNCM: He does not have a way of checking his blood pressure, we still can not afford 2 of his medications        CARE PLAN ENTRY (see longtitudinal plan of care for  additional care plan information)  Current Barriers:   Chronic Disease Management support, education, and care coordination needs related to Atrial Fibrillation, HTN, and HLD  Financial Constraints  Clinical Goal(s) related to Atrial Fibrillation, HTN, and HLD:  Over the next 90 days, patient will:   Work with the care management team to address educational, disease management, and care coordination needs   Begin or continue self health monitoring activities as directed today Measure and record blood pressure 4 times per week and take medications as prescribed   Call provider office for new or worsened signs and symptoms Blood pressure findings outside established parameters  and New or worsened symptom related to HLD and AFIB  Call care management team with questions or concerns  Verbalize basic understanding of patient centered plan of care established today  Over the next 90 days, Daughter in law will call [employer] and talk to customer service about the over the counter benefit and life alert system for the patient to get blood pressure cuff and system to help with safety  Interventions related to Atrial Fibrillation, HTN, and HLD:   Evaluation of current treatment plans and patient's adherence to plan as established by provider- per the daughter in law the patient is still not taking the Metoprolol and Zocor due to affordability One is >$100 and the other is $47.00  Assessed patient understanding of disease states- daughter in law helps the patient manage his care  Assessed patient's education and care coordination needs- denies the need for social work at this time but would like to talk to the pharmacist to assist with cost and resources to help pay for medications  Provided disease specific education to patient: Heart Healthy diet options, taking medications as prescribed, education and support on checking and recording blood pressure daily for optimal management of HTN  Collaborated with appropriate clinical care team members regarding patient needs  Educated the daughter in law to call  [facility] customer service number on the back of the patients insurance card and request the OTC catalog to be sent and information on obtaining the [employer] system free of charge  Sent EMAIL to [email or web address] so the daughter in law can sign up for myChart and see lab work, communicate with the healthcare team and educational information- 02-10-2020: Has not signed up yet. Will discuss further as today's call was an urgent call for decline in condition.   Patient Self Care Activities related to Atrial Fibrillation, HTN, and HLD:   Patient is unable to  independently self-manage chronic health conditions  Please see past updates related to this goal by clicking on the "Past Updates" button in the selected goal         Patient verbalizes understanding of instructions provided today.   The care management team will reach out to the patient again over the next 2 to 14 days.   [name] RN, MSN, CCM Community Care Coordinator [place]   [facility] Mobile: [phone]

## 2020-02-11 ENCOUNTER — Telehealth: Payer: Self-pay | Admitting: General Practice

## 2020-02-11 DIAGNOSIS — F329 Major depressive disorder, single episode, unspecified: Secondary | ICD-10-CM | POA: Diagnosis present

## 2020-02-11 DIAGNOSIS — U071 COVID-19: Secondary | ICD-10-CM | POA: Diagnosis not present

## 2020-02-11 DIAGNOSIS — E785 Hyperlipidemia, unspecified: Secondary | ICD-10-CM | POA: Diagnosis present

## 2020-02-11 DIAGNOSIS — Z8616 Personal history of COVID-19: Secondary | ICD-10-CM

## 2020-02-11 DIAGNOSIS — I129 Hypertensive chronic kidney disease with stage 1 through stage 4 chronic kidney disease, or unspecified chronic kidney disease: Secondary | ICD-10-CM | POA: Diagnosis present

## 2020-02-11 DIAGNOSIS — Z888 Allergy status to other drugs, medicaments and biological substances status: Secondary | ICD-10-CM | POA: Diagnosis not present

## 2020-02-11 DIAGNOSIS — M109 Gout, unspecified: Secondary | ICD-10-CM | POA: Diagnosis present

## 2020-02-11 DIAGNOSIS — N1832 Chronic kidney disease, stage 3b: Secondary | ICD-10-CM | POA: Diagnosis present

## 2020-02-11 DIAGNOSIS — R531 Weakness: Secondary | ICD-10-CM

## 2020-02-11 DIAGNOSIS — I480 Paroxysmal atrial fibrillation: Secondary | ICD-10-CM

## 2020-02-11 DIAGNOSIS — Z7901 Long term (current) use of anticoagulants: Secondary | ICD-10-CM | POA: Diagnosis not present

## 2020-02-11 DIAGNOSIS — Z7982 Long term (current) use of aspirin: Secondary | ICD-10-CM | POA: Diagnosis not present

## 2020-02-11 DIAGNOSIS — Z885 Allergy status to narcotic agent status: Secondary | ICD-10-CM | POA: Diagnosis not present

## 2020-02-11 DIAGNOSIS — G4700 Insomnia, unspecified: Secondary | ICD-10-CM | POA: Diagnosis present

## 2020-02-11 DIAGNOSIS — N179 Acute kidney failure, unspecified: Secondary | ICD-10-CM | POA: Diagnosis not present

## 2020-02-11 DIAGNOSIS — F419 Anxiety disorder, unspecified: Secondary | ICD-10-CM | POA: Diagnosis present

## 2020-02-11 DIAGNOSIS — Z79899 Other long term (current) drug therapy: Secondary | ICD-10-CM | POA: Diagnosis not present

## 2020-02-11 DIAGNOSIS — Z720 Tobacco use: Secondary | ICD-10-CM | POA: Diagnosis not present

## 2020-02-11 DIAGNOSIS — N400 Enlarged prostate without lower urinary tract symptoms: Secondary | ICD-10-CM | POA: Diagnosis present

## 2020-02-11 LAB — ABO/RH: ABO/RH(D): O NEG

## 2020-02-11 LAB — CK: Total CK: 20 U/L — ABNORMAL LOW (ref 49–397)

## 2020-02-11 LAB — POC SARS CORONAVIRUS 2 AG: SARS Coronavirus 2 Ag: POSITIVE — AB

## 2020-02-11 LAB — SARS CORONAVIRUS 2 BY RT PCR (HOSPITAL ORDER, PERFORMED IN ~~LOC~~ HOSPITAL LAB): SARS Coronavirus 2: POSITIVE — AB

## 2020-02-11 MED ORDER — ACETAMINOPHEN 325 MG PO TABS
650.0000 mg | ORAL_TABLET | Freq: Four times a day (QID) | ORAL | Status: DC | PRN
  Administered 2020-02-12 – 2020-02-15 (×4): 650 mg via ORAL
  Filled 2020-02-11 (×4): qty 2

## 2020-02-11 MED ORDER — ONDANSETRON HCL 4 MG/2ML IJ SOLN
4.0000 mg | Freq: Four times a day (QID) | INTRAMUSCULAR | Status: DC | PRN
  Administered 2020-02-11: 4 mg via INTRAVENOUS
  Filled 2020-02-11: qty 2

## 2020-02-11 MED ORDER — APIXABAN 5 MG PO TABS
5.0000 mg | ORAL_TABLET | Freq: Two times a day (BID) | ORAL | Status: DC
  Administered 2020-02-11: 5 mg via ORAL
  Filled 2020-02-11: qty 1

## 2020-02-11 MED ORDER — ADULT MULTIVITAMIN W/MINERALS CH
1.0000 | ORAL_TABLET | Freq: Every day | ORAL | Status: DC
  Administered 2020-02-11 – 2020-02-15 (×5): 1 via ORAL
  Filled 2020-02-11 (×5): qty 1

## 2020-02-11 MED ORDER — HYDROCODONE-ACETAMINOPHEN 5-325 MG PO TABS
1.0000 | ORAL_TABLET | ORAL | Status: DC | PRN
  Administered 2020-02-11 – 2020-02-13 (×2): 1 via ORAL
  Filled 2020-02-11 (×2): qty 1

## 2020-02-11 MED ORDER — ALBUTEROL SULFATE HFA 108 (90 BASE) MCG/ACT IN AERS
2.0000 | INHALATION_SPRAY | Freq: Four times a day (QID) | RESPIRATORY_TRACT | Status: DC
  Administered 2020-02-11 – 2020-02-15 (×15): 2 via RESPIRATORY_TRACT
  Filled 2020-02-11 (×2): qty 6.7

## 2020-02-11 MED ORDER — ZINC SULFATE 220 (50 ZN) MG PO CAPS
220.0000 mg | ORAL_CAPSULE | Freq: Every day | ORAL | Status: DC
  Administered 2020-02-11 – 2020-02-15 (×5): 220 mg via ORAL
  Filled 2020-02-11 (×5): qty 1

## 2020-02-11 MED ORDER — SODIUM CHLORIDE 0.9 % IV SOLN
200.0000 mg | Freq: Once | INTRAVENOUS | Status: AC
  Administered 2020-02-11: 200 mg via INTRAVENOUS
  Filled 2020-02-11: qty 200

## 2020-02-11 MED ORDER — SODIUM CHLORIDE 0.9 % IV SOLN
100.0000 mg | Freq: Every day | INTRAVENOUS | Status: AC
  Administered 2020-02-12 – 2020-02-15 (×4): 100 mg via INTRAVENOUS
  Filled 2020-02-11: qty 100
  Filled 2020-02-11: qty 20
  Filled 2020-02-11 (×2): qty 100

## 2020-02-11 MED ORDER — ONDANSETRON HCL 4 MG PO TABS
4.0000 mg | ORAL_TABLET | Freq: Four times a day (QID) | ORAL | Status: DC | PRN
  Administered 2020-02-14: 4 mg via ORAL
  Filled 2020-02-11: qty 1

## 2020-02-11 MED ORDER — HYDROCOD POLST-CPM POLST ER 10-8 MG/5ML PO SUER
5.0000 mL | Freq: Two times a day (BID) | ORAL | Status: DC | PRN
  Administered 2020-02-13 – 2020-02-15 (×5): 5 mL via ORAL
  Filled 2020-02-11 (×5): qty 5

## 2020-02-11 MED ORDER — GUAIFENESIN-DM 100-10 MG/5ML PO SYRP
10.0000 mL | ORAL_SOLUTION | ORAL | Status: DC | PRN
  Administered 2020-02-11 – 2020-02-15 (×4): 10 mL via ORAL
  Filled 2020-02-11 (×5): qty 10

## 2020-02-11 MED ORDER — APIXABAN 2.5 MG PO TABS
2.5000 mg | ORAL_TABLET | Freq: Two times a day (BID) | ORAL | Status: DC
  Administered 2020-02-11 – 2020-02-12 (×2): 2.5 mg via ORAL
  Filled 2020-02-11 (×3): qty 1

## 2020-02-11 MED ORDER — ASCORBIC ACID 500 MG PO TABS
500.0000 mg | ORAL_TABLET | Freq: Every day | ORAL | Status: DC
  Administered 2020-02-11 – 2020-02-15 (×5): 500 mg via ORAL
  Filled 2020-02-11 (×5): qty 1

## 2020-02-11 NOTE — Progress Notes (Signed)
Remdesivir - Pharmacy Brief Note   O:  ALT:  CXR:  SpO2: 98% on RA   A/P:  Remdesivir 200 mg IVPB once followed by 100 mg IVPB daily x 4 days.   Valrie Hart, PharmD Clinical Pharmacist  02/11/2020 2:07 AM

## 2020-02-11 NOTE — H&P (Addendum)
History and Physical    Caleb Fleming. VOZ:[id number] DOB: 1932/06/19 DOA: 02/10/2020  PCP: Particia Nearing, PA-C   Patient coming from: home  I have personally briefly reviewed patient's old medical records in [facility]  Chief Complaint: Cough, generalized weakness HPI: Caleb Fleming. is a 86 y.o. male with medical history significant for paroxysmal atrial fibrillation, hypertension, CKD 3b, BPH, who presents to the emergency room with a 2-week history of generalized weakness, congested but nonproductive cough, poor appetite with decreased oral intake.  He states his son had Covid just over 2 weeks ago and soon thereafter he went to get a Covid test which was negative.  He got his first Covid vaccine at the same time.  His second Covid shot is due 02/14/2020.  His symptoms started not long after.  He denies shortness of breath, chest pain, nausea or vomiting, abdominal pain or change in bowel habits. ED Course: On arrival vitals mostly unremarkable with O2 sat 100% on room air.  Labs notable for creatinine of 2.37Above baseline of 1.58 for his CKD 3.  Review of Systems: As per HPI otherwise all other systems on review of systems negative.    Past Medical History:  Diagnosis Date  . Anxiety   . BPH (benign prostatic hypertrophy)   . Depression   . ED (erectile dysfunction)   . Gout   . Hyperlipidemia   . Hypertension   . Insomnia     History reviewed. No pertinent surgical history.   reports that he has quit smoking. His smokeless tobacco use includes chew. He reports current alcohol use of about 49.0 standard drinks of alcohol per week. He reports that he does not use drugs.  Allergies  Allergen Reactions  . Bactrim [Sulfamethoxazole-Trimethoprim] Itching  . Codeine     Family History  Problem Relation Age of Onset  . Cancer Mother   . Cancer Father       Prior to Admission medications   Medication Sig Start Date End Date Taking? Authorizing  Provider  acetaminophen (TYLENOL) 325 MG tablet Take 650 mg by mouth every 4 (four) hours as needed. 05/09/15  Yes [provider]  allopurinol (ZYLOPRIM) 100 MG tablet Take 1 tablet (100 mg total) by mouth daily. 11/24/19  Yes Particia Nearing, PA-C  amiodarone (PACERONE) 200 MG tablet TAKE 1 TABLET BY MOUTH EVERY DAY 01/29/20  Yes Particia Nearing, PA-C  apixaban (ELIQUIS) 5 MG TABS tablet Take 1 tablet (5 mg total) by mouth 2 (two) times daily. 09/23/19  Yes Particia Nearing, PA-C  ASPIRIN EC PO Take 81 mg by mouth daily.   Yes [provider]  furosemide (LASIX) 40 MG tablet Take 1 tablet (40 mg total) by mouth 2 (two) times daily. 08/12/19 08/11/20 Yes Particia Nearing, PA-C  metoprolol succinate (TOPROL-XL) 25 MG 24 hr tablet TAKE 1 TABLET BY MOUTH EVERY DAY 01/29/20  Yes Particia Nearing, PA-C  potassium chloride (KLOR-CON) 10 MEQ tablet TAKE 1 TABLET (10 MEQ TOTAL) BY MOUTH 2 (TWO) TIMES DAILY. 12/17/19  Yes Particia Nearing, PA-C  simvastatin (ZOCOR) 20 MG tablet Take 1 tablet (20 mg total) by mouth daily at 6 PM. 09/23/19  Yes Particia Nearing, PA-C  WIXELA INHUB 250-50 MCG/DOSE AEPB Inhale 1 puff into the lungs 2 (two) times daily. 12/09/19  Yes [provider]    Physical Exam: Vitals:   02/10/20 1728 02/10/20 1730 02/10/20 2359  BP: 137/63  (!) 157/113  Pulse: (!) 57  (!) 59  Resp: 18  (!) 25  Temp: 98.3 F (36.8 C)    TempSrc: Oral    SpO2: 100%  97%  Weight:  72.6 kg   Height:  5\' 7"  (1.702 m)      Vitals:   02/10/20 1728 02/10/20 1730 02/10/20 2359  BP: 137/63  (!) 157/113  Pulse: (!) 57  (!) 59  Resp: 18  (!) 25  Temp: 98.3 F (36.8 C)    TempSrc: Oral    SpO2: 100%  97%  Weight:  72.6 kg   Height:  5\' 7"  (1.702 m)       Constitutional:  Ill-appearing, tachypneic, unable to complete sentences due to coughing  HEENT:      Head: Normocephalic and atraumatic.         Eyes: PERLA, EOMI, Conjunctivae  are normal. Sclera is non-icteric.       Mouth/Throat: Mucous membranes are moist.       Neck: Supple with no signs of meningismus. Cardiovascular: Regular rate and rhythm. No murmurs, gallops, or rubs. 2+ symmetrical distal pulses are present . No JVD. No LE edema Respiratory: Respiratory effort increased.Lungs sounds clear bilaterally. No wheezes, crackles, or rhonchi.  Gastrointestinal: Soft, non tender, and non distended with positive bowel sounds. No rebound or guarding. Genitourinary: No CVA tenderness. Musculoskeletal: Nontender with normal range of motion in all extremities. No cyanosis, or erythema of extremities. Neurologic: Normal speech and language. Face is symmetric. Moving all extremities. No gross focal neurologic deficits . Skin: Skin is warm, dry.  No rash or ulcers Psychiatric: Mood and affect are normal Speech and behavior are normal   Labs on Admission: I have personally reviewed following labs and imaging studies  CBC: Recent Labs  Lab 02/10/20 1734  WBC 10.1  HGB 14.2  HCT 41.2  MCV 93.6  PLT 364   Basic Metabolic Panel: Recent Labs  Lab 02/10/20 1734  NA 137  K 3.5  CL 100  CO2 24  GLUCOSE 111*  BUN 51*  CREATININE 2.37*  CALCIUM 8.5*   GFR: Estimated Creatinine Clearance: 20.9 mL/min (A) (by C-G formula based on SCr of 2.37 mg/dL (H)). Liver Function Tests: No results for input(s): AST, ALT, ALKPHOS, BILITOT, PROT, ALBUMIN in the last 168 hours. No results for input(s): LIPASE, AMYLASE in the last 168 hours. No results for input(s): AMMONIA in the last 168 hours. Coagulation Profile: No results for input(s): INR, PROTIME in the last 168 hours. Cardiac Enzymes: No results for input(s): CKTOTAL, CKMB, CKMBINDEX, TROPONINI in the last 168 hours. BNP (last 3 results) No results for input(s): PROBNP in the last 8760 hours. HbA1C: No results for input(s): HGBA1C in the last 72 hours. CBG: No results for input(s): GLUCAP in the last 168  hours. Lipid Profile: No results for input(s): CHOL, HDL, LDLCALC, TRIG, CHOLHDL, LDLDIRECT in the last 72 hours. Thyroid Function Tests: No results for input(s): TSH, T4TOTAL, FREET4, T3FREE, THYROIDAB in the last 72 hours. Anemia Panel: No results for input(s): VITAMINB12, FOLATE, FERRITIN, TIBC, IRON, RETICCTPCT in the last 72 hours. Urine analysis:    Component Value Date/Time   COLORURINE YELLOW (A) 12/08/2018 2052   APPEARANCEUR CLOUDY (A) 12/08/2018 2052   LABSPEC 1.021 12/08/2018 2052   PHURINE 5.0 12/08/2018 2052   GLUCOSEU NEGATIVE 12/08/2018 2052   HGBUR NEGATIVE 12/08/2018 2052   BILIRUBINUR NEGATIVE 12/08/2018 2052   KETONESUR 20 (A) 12/08/2018 2052   PROTEINUR 30 (A) 12/08/2018 2052   NITRITE  NEGATIVE 12/08/2018 2052   LEUKOCYTESUR LARGE (A) 12/08/2018 2052    Radiological Exams on Admission: DG Chest 2 View  Result Date: 02/10/2020 CLINICAL DATA:  86 year old male with cough. EXAM: CHEST - 2 VIEW COMPARISON:  Chest radiograph dated 12/11/2018 FINDINGS: No focal consolidation, pleural effusion, or pneumothorax. The cardiac silhouette is within limits atherosclerotic calcification of the aorta. Degenerative changes of the spine. Old healed right rib fractures. No acute osseous pathology. IMPRESSION: No active cardiopulmonary disease. Electronically Signed   By: Elgie Collard M.D.   On: 02/10/2020 18:02   CT Head Wo Contrast  Result Date: 02/10/2020 CLINICAL DATA:  Headache. EXAM: CT HEAD WITHOUT CONTRAST TECHNIQUE: Contiguous axial images were obtained from the base of the skull through the vertex without intravenous contrast. COMPARISON:  None. FINDINGS: Brain: There is mild to moderate severity cerebral atrophy with widening of the extra-axial spaces and ventricular dilatation. There are areas of decreased attenuation within the white matter tracts of the supratentorial brain, consistent with microvascular disease changes. Vascular: No hyperdense vessel or unexpected  calcification. Skull: Normal. Negative for fracture or focal lesion. Sinuses/Orbits: There is a small right maxillary sinus air-fluid level. Mild to moderate severity bilateral ethmoid sinus mucosal thickening is seen. Marked severity sphenoid sinus mucosal thickening is noted Other: None. IMPRESSION: 1. Generalized cerebral atrophy. 2. No acute intracranial abnormality. 3. Paranasal sinus disease, as described above. Electronically Signed   By: Aram Candela M.D.   On: 02/10/2020 22:20    EKG: Independently reviewed. Interpretation : Normal sinus rhythm with no acute ST-T wave changes  Assessment/Plan 86 year old male with a history of HTN, paroxysmal A. fib presenting with cough and generalized weakness and known Covid exposure, testing positive for Covid following admission.  First Covid vaccine was less than 2 weeks prior following onset of symptoms  COVID-19 respiratory tract illness -No pneumonia on chest x-ray.  No fever or chills rapid Covid positive -Patient started with cough and weakness and poor appetite about 7 to 10 days prior -Remdesivir, albuterol.  No hypoxia -Follow inflammatory biomarkers  Generalized weakness, related to COVID-19 -Patient with protracted weakness poor appetite -IV hydration, -Nutritionist consult  Acute kidney injury superimposed on CKD 3b: -Creatinine 2.37 above baseline of 1.57 -IV hydration -Likely prerenal and related to poor oral intake over the past couple weeks related to acute illness  Essential hypertension -Continue home metoprolol.  Hold furosemide due to AKI  Paroxysmal atrial fibrillation -Continue apixaban and metoprolol      DVT prophylaxis: Apixaban Code Status: full code  Family Communication:  none  Disposition Plan: Back to previous home environment Consults called: none  Status: Inpatient    Andris Baumann MD [facility]     02/11/2020, 12:37 AM

## 2020-02-11 NOTE — ED Notes (Signed)
Pt assisted to bedside toilet to have BM at this time. Pt able to stand and pivot without any difficulties.

## 2020-02-11 NOTE — ED Notes (Signed)
Pt given breakfast tray and sat up in bed

## 2020-02-11 NOTE — ED Notes (Signed)
Pt assisted to toilet to have BM 

## 2020-02-11 NOTE — Telephone Encounter (Cosign Needed)
  Chronic Care Management   Note  02/11/2020 Name: Caleb Fleming. MRN: [id number] DOB: 1932-04-29  Incoming call from the patients daughter in law, Oleg Oleson, letting the [facility] know that the patient has been admitted to the hospital for treatment and evaluation. She will keep the CCM team up to date on the patients progress.    Follow up plan: The care management team will reach out to the patient again over the next 30 days.   [name] RN, MSN, CCM Community Care Coordinator [place]  [facility] Mobile: [phone]

## 2020-02-11 NOTE — ED Notes (Signed)
Pt given urinal.

## 2020-02-11 NOTE — ED Notes (Signed)
Dr. Para March is aware that pt is covid-19 positive

## 2020-02-11 NOTE — ED Notes (Signed)
Pt monitoring equipment fixed per pt request

## 2020-02-11 NOTE — Progress Notes (Signed)
Same day rounding progress note  Patient seen and examined while in the ED, waiting for the floor bed.  No new complaints.  Seems comfortable.  Cough and weakness present  COVID-19 infection -No pneumonia on chest x-ray.  No fever or chills rapid Covid positive -Patient started with cough and weakness and poor appetite about 7 to 10 days prior -Remdesivir day 1/5, albuterol.  No hypoxia -Follow inflammatory biomarkers  Generalized weakness, related to COVID-19 -Patient with protracted weakness poor appetite -IV hydration, -Nutritionist consult - PT/OT consult  Acute kidney injury superimposed on CKD 3b: -Creatinine 2.37 above baseline of 1.57 -Continue IV hydration -Likely prerenal and related to poor oral intake over the past couple weeks related to acute illness  Essential hypertension -Hold metoprolol as blood pressure is low.  Hold furosemide due to AKI  Paroxysmal atrial fibrillation -Continue apixaban for anticoagulation, hold metoprolol as blood pressure is running low.  Heart rate is better controlled without any medication  Time spent: 20 minutes  Possible discharge in 4 days after completion of IV remdesivir.

## 2020-02-12 LAB — CBC WITH DIFFERENTIAL/PLATELET
Abs Immature Granulocytes: 0.29 10*3/uL — ABNORMAL HIGH (ref 0.00–0.07)
Basophils Absolute: 0 10*3/uL (ref 0.0–0.1)
Basophils Relative: 0 %
Eosinophils Absolute: 0.1 10*3/uL (ref 0.0–0.5)
Eosinophils Relative: 1 %
HCT: 32.8 % — ABNORMAL LOW (ref 39.0–52.0)
Hemoglobin: 11.2 g/dL — ABNORMAL LOW (ref 13.0–17.0)
Immature Granulocytes: 3 %
Lymphocytes Relative: 12 %
Lymphs Abs: 1 10*3/uL (ref 0.7–4.0)
MCH: 32.3 pg (ref 26.0–34.0)
MCHC: 34.1 g/dL (ref 30.0–36.0)
MCV: 94.5 fL (ref 80.0–100.0)
Monocytes Absolute: 1 10*3/uL (ref 0.1–1.0)
Monocytes Relative: 12 %
Neutro Abs: 6 10*3/uL (ref 1.7–7.7)
Neutrophils Relative %: 72 %
Platelets: 326 10*3/uL (ref 150–400)
RBC: 3.47 MIL/uL — ABNORMAL LOW (ref 4.22–5.81)
RDW: 13.9 % (ref 11.5–15.5)
WBC: 8.4 10*3/uL (ref 4.0–10.5)
nRBC: 0 % (ref 0.0–0.2)

## 2020-02-12 LAB — COMPREHENSIVE METABOLIC PANEL
ALT: 27 U/L (ref 0–44)
AST: 23 U/L (ref 15–41)
Albumin: 2.6 g/dL — ABNORMAL LOW (ref 3.5–5.0)
Alkaline Phosphatase: 47 U/L (ref 38–126)
Anion gap: 9 (ref 5–15)
BUN: 28 mg/dL — ABNORMAL HIGH (ref 8–23)
CO2: 26 mmol/L (ref 22–32)
Calcium: 8.1 mg/dL — ABNORMAL LOW (ref 8.9–10.3)
Chloride: 104 mmol/L (ref 98–111)
Creatinine, Ser: 1.48 mg/dL — ABNORMAL HIGH (ref 0.61–1.24)
GFR calc Af Amer: 49 mL/min — ABNORMAL LOW (ref >60)
GFR calc non Af Amer: 42 mL/min — ABNORMAL LOW (ref >60)
Glucose, Bld: 88 mg/dL (ref 70–99)
Potassium: 3.5 mmol/L (ref 3.5–5.1)
Sodium: 139 mmol/L (ref 135–145)
Total Bilirubin: 0.9 mg/dL (ref 0.3–1.2)
Total Protein: 5.7 g/dL — ABNORMAL LOW (ref 6.5–8.1)

## 2020-02-12 LAB — FERRITIN: Ferritin: 206 ng/mL (ref 24–336)

## 2020-02-12 LAB — MAGNESIUM: Magnesium: 2.2 mg/dL (ref 1.7–2.4)

## 2020-02-12 LAB — FIBRIN DERIVATIVES D-DIMER (ARMC ONLY): Fibrin derivatives D-dimer (ARMC): 367.92 ng/mL (FEU) (ref 0.00–499.00)

## 2020-02-12 LAB — C-REACTIVE PROTEIN: CRP: 5.9 mg/dL — ABNORMAL HIGH (ref <1.0)

## 2020-02-12 LAB — PHOSPHORUS: Phosphorus: 2.9 mg/dL (ref 2.5–4.6)

## 2020-02-12 MED ORDER — SIMVASTATIN 20 MG PO TABS
20.0000 mg | ORAL_TABLET | Freq: Every day | ORAL | Status: DC
  Administered 2020-02-12 – 2020-02-14 (×3): 20 mg via ORAL
  Filled 2020-02-12 (×3): qty 1

## 2020-02-12 MED ORDER — APIXABAN 5 MG PO TABS
5.0000 mg | ORAL_TABLET | Freq: Two times a day (BID) | ORAL | Status: DC
  Administered 2020-02-12 – 2020-02-15 (×6): 5 mg via ORAL
  Filled 2020-02-12 (×6): qty 1

## 2020-02-12 MED ORDER — ASPIRIN EC 81 MG PO TBEC
81.0000 mg | DELAYED_RELEASE_TABLET | Freq: Every day | ORAL | Status: DC
  Administered 2020-02-13 – 2020-02-15 (×3): 81 mg via ORAL
  Filled 2020-02-12 (×3): qty 1

## 2020-02-12 NOTE — Evaluation (Addendum)
Physical Therapy Evaluation Patient Details Name: Caleb Fleming. MRN: [id number] DOB: [birth year] Today's Date: 02/12/2020   History of Present Illness  86 y.o. male with medical history significant for paroxysmal atrial fibrillation, hypertension, CKD 3b, BPH, who presents to the emergency room with a 2-week history of generalized weakness, congested but nonproductive cough, poor appetite with decreased oral intake.  He states his son had Covid just over 2 weeks ago and soon thereafter he went to get a Covid test which was negative.  He got his first Covid vaccine at the same time (second Covid shot is due 02/14/2020).  His symptoms started not long after.  Clinical Impression  Pt did well with PT exam and showed good effort and confidence with mobility and ambulation.  He was able to ambulate safely in room and had only very little subjective fatigue.  He was on room air and is sats stayed in the high 90s the entire time, HR stable with the effort as well.  He reports he is feeling much better now than a few days ago.  He agrees that he will not need PT at discharge, but we will maintain him on PT caseload to insure continued improvement/maintenance and mobility while admitted.   Follow Up Recommendations No PT follow up    Equipment Recommendations  None recommended by PT    Recommendations for Other Services       Precautions / Restrictions Precautions Precautions: Fall (minimal fall risk) Restrictions Weight Bearing Restrictions: No      Mobility  Bed Mobility Overal bed mobility: Independent             General bed mobility comments: Pt easily gets himself to EOB  Transfers Overall transfer level: Independent Equipment used: None             General transfer comment: Pt was able to rise to standing confidently and w/o issue  Ambulation/Gait Ambulation/Gait assistance: Supervision Gait Distance (Feet): 60 Feet Assistive device: Rolling walker (2 wheeled)        General Gait Details: Pt was able to make multiple small loops in his isolation room.  He did have a single small stagger step that he easily self arrested, but ultimately he was safe and near his baseline with mobility.  Sats remained in the high 90s on room air, HR stable as well.  Stairs            Wheelchair Mobility    Modified Rankin (Stroke Patients Only)       Balance Overall balance assessment: Modified Independent                                           Pertinent Vitals/Pain Pain Assessment: No/denies pain (reports he was sore all over a few days ago)    Home Living Family/patient expects to be discharged to:: Private residence Living Arrangements: Children Available Help at Discharge: Family   Home Access: Stairs to enter   Secretary/administrator of Steps: 4 Home Layout: One level Home Equipment: None      Prior Function Level of Independence: Independent         Comments: Pt states he no longer drives but does get out with children     Hand Dominance        Extremity/Trunk Assessment   Upper Extremity Assessment Upper Extremity Assessment: Overall WFL for tasks assessed (  age appropriate defecits)    Lower Extremity Assessment Lower Extremity Assessment: Overall WFL for tasks assessed (age appropriate defecits)       Communication   Communication: No difficulties  Cognition Arousal/Alertness: Awake/alert Behavior During Therapy: WFL for tasks assessed/performed Overall Cognitive Status: Within Functional Limits for tasks assessed                                        General Comments General comments (skin integrity, edema, etc.): Pt did well with mobiliy, activity tolerance and feels a little weak but confident about being able to go home    Exercises     Assessment/Plan    PT Assessment Patient needs continued PT services  PT Problem List Decreased strength;Decreased range of  motion;Decreased activity tolerance;Decreased balance;Decreased mobility;Decreased coordination;Decreased safety awareness;Decreased knowledge of use of DME       PT Treatment Interventions Functional mobility training;Therapeutic activities;Gait training;Stair training;Therapeutic exercise;Balance training;Neuromuscular re-education;Patient/family education    PT Goals (Current goals can be found in the Care Plan section)  Acute Rehab PT Goals Patient Stated Goal: Go home PT Goal Formulation: With patient Time For Goal Achievement: 02/26/20 Potential to Achieve Goals: Good    Frequency Min 2X/week   Barriers to discharge        Co-evaluation               AM-PAC PT "6 Clicks" Mobility  Outcome Measure Help needed turning from your back to your side while in a flat bed without using bedrails?: None Help needed moving from lying on your back to sitting on the side of a flat bed without using bedrails?: None Help needed moving to and from a bed to a chair (including a wheelchair)?: None Help needed standing up from a chair using your arms (e.g., wheelchair or bedside chair)?: None Help needed to walk in hospital room?: None Help needed climbing 3-5 steps with a railing? : None 6 Click Score: 24    End of Session   Activity Tolerance: Patient tolerated treatment well Patient left: with call bell/phone within reach;with bed alarm set Nurse Communication: Mobility status PT Visit Diagnosis: Muscle weakness (generalized) (M62.81);Unsteadiness on feet (R26.81)    Time: 5809-9833 PT Time Calculation (min) (ACUTE ONLY): 22 min   Charges:   PT Evaluation $PT Eval Low Complexity: 1 Low          Malachi Pro, DPT 02/12/2020, 10:45 AM

## 2020-02-12 NOTE — Evaluation (Signed)
Occupational Therapy Evaluation Patient Details Name: Caleb Fleming. MRN: [id number] DOB: 12/01/1931 Today's Date: 02/12/2020    History of Present Illness 86 y.o. male with medical history significant for paroxysmal atrial fibrillation, hypertension, CKD 3b, BPH, who presents to the emergency room with a 2-week history of generalized weakness, congested but nonproductive cough, poor appetite with decreased oral intake.  He states his son had Covid just over 2 weeks ago and soon thereafter he went to get a Covid test which was negative.  He got his first Covid vaccine at the same time (second Covid shot is due 02/14/2020).  His symptoms started not long after.   Clinical Impression   Pt seen for OT evaluation this date in setting of acute hospitalization with COVID. Pt reports that he lives with his son and DIL in home with 4 STE. Pt reports that he is typically able to perform ADLs himself and has assist for [facility] IADLs from family members as needed. Does not drive and relies on son/DIL for transportation. This date, pt experiences some dizziness on initial CTS from EOB, but static stands for ~10 seconds and reports dizziness dissipates. Pt then performs fxl mobility to/from restroom with SUPV only for OT to manage lines/monitors. Pt performs commode transfer with use of grab bar and MOD I for extended time, but no physical assist or cue for safety required from OT. OT facilitates ed re: recommendations for home to maintain his fxl activity tolerance and prevent atrophy. Pt with good understanding. Do not anticipate further acute OT needs at this time nor need for OT f/u services at home.     Follow Up Recommendations  No OT follow up    Equipment Recommendations  Tub/shower seat    Recommendations for Other Services       Precautions / Restrictions Precautions Precautions: Fall Precaution Comments: minimal Restrictions Weight Bearing Restrictions: No      Mobility Bed  Mobility Overal bed mobility: Independent             General bed mobility comments: Pt easily gets himself to EOB  Transfers Overall transfer level: Independent Equipment used: None             General transfer comment: increased time to CTS, states he is somewhat dizzy at first, but then confidently completes all other transfers during sesion including to/from commode.    Balance Overall balance assessment: Modified Independent                                         ADL either performed or assessed with clinical judgement   ADL Overall ADL's : Modified independent                                       General ADL Comments: MOD I-increased time for all ADLs, ADL transfers, and fxl mobility with no AD assessed.     Vision Patient Visual Report: No change from baseline       Perception     Praxis      Pertinent Vitals/Pain Pain Assessment: No/denies pain     Hand Dominance     Extremity/Trunk Assessment Upper Extremity Assessment Upper Extremity Assessment: Overall WFL for tasks assessed (age appropriate)   Lower Extremity Assessment Lower Extremity Assessment: Overall WFL for tasks assessed (  age appropriate)       Communication Communication Communication: No difficulties   Cognition Arousal/Alertness: Awake/alert Behavior During Therapy: WFL for tasks assessed/performed Overall Cognitive Status: Within Functional Limits for tasks assessed                                     General Comments       Exercises Other Exercises Other Exercises: OT educates re: role of OT in acute setting and recommendations for remaining strength/activity tolerance. Pt with good understanding.   Shoulder Instructions      Home Living Family/patient expects to be discharged to:: Private residence Living Arrangements: Children (son and DIL, states son just went back to work after also having COVID) Available Help  at Discharge: Family   Home Access: Stairs to enter Secretary/administrator of Steps: 4   Home Layout: One level               Home Equipment: None          Prior Functioning/Environment Level of Independence: Independent        Comments: Pt states he no longer drives but does get out with children        OT Problem List: Decreased activity tolerance;Cardiopulmonary status limiting activity      OT Treatment/Interventions: Self-care/ADL training;Therapeutic activities    OT Goals(Current goals can be found in the care plan section) Acute Rehab OT Goals Patient Stated Goal: Go home OT Goal Formulation: All assessment and education complete, DC therapy  OT Frequency:     Barriers to D/C:            Co-evaluation              AM-PAC OT "6 Clicks" Daily Activity     Outcome Measure Help from another person eating meals?: None Help from another person taking care of personal grooming?: None Help from another person toileting, which includes using toliet, bedpan, or urinal?: None Help from another person bathing (including washing, rinsing, drying)?: A Little (supv for bathing d/t increased fall risk with wet standing surface.) Help from another person to put on and taking off regular upper body clothing?: None Help from another person to put on and taking off regular lower body clothing?: None 6 Click Score: 23   End of Session    Activity Tolerance: Patient tolerated treatment well Patient left: in bed;with call bell/phone within reach;with bed alarm set  OT Visit Diagnosis: Muscle weakness (generalized) (M62.81)                Time: 1450-1534 OT Time Calculation (min): 44 min Charges:  OT General Charges $OT Visit: 1 Visit OT Evaluation $OT Eval Moderate Complexity: 1 Mod OT Treatments $Self Care/Home Management : 8-22 mins $Therapeutic Activity: 8-22 mins  Rejeana Brock, MS, OTR/L ascom [phone] 02/12/20, 3:44 PM

## 2020-02-12 NOTE — Progress Notes (Signed)
1        [place] at [facility]   PATIENT NAME: Caleb Fleming    MR#:  [id number]  DATE OF BIRTH:  11-27-1931  SUBJECTIVE:  CHIEF COMPLAINT:   Chief Complaint  Patient presents with  . Headache  . Cough  Coughing, shortness of breath improving REVIEW OF SYSTEMS:  Review of Systems  Constitutional: Positive for malaise/fatigue. Negative for diaphoresis, fever and weight loss.  HENT: Negative for ear discharge, ear pain, hearing loss, nosebleeds, sore throat and tinnitus.   Eyes: Negative for blurred vision and pain.  Respiratory: Positive for cough and shortness of breath. Negative for hemoptysis and wheezing.   Cardiovascular: Negative for chest pain, palpitations, orthopnea and leg swelling.  Gastrointestinal: Negative for abdominal pain, blood in stool, constipation, diarrhea, heartburn, nausea and vomiting.  Genitourinary: Negative for dysuria, frequency and urgency.  Musculoskeletal: Negative for back pain and myalgias.  Skin: Negative for itching and rash.  Neurological: Negative for dizziness, tingling, tremors, focal weakness, seizures, weakness and headaches.  Psychiatric/Behavioral: Negative for depression. The patient is not nervous/anxious.    DRUG ALLERGIES:   Allergies  Allergen Reactions  . Bactrim [Sulfamethoxazole-Trimethoprim] Itching  . Codeine    VITALS:  Blood pressure (!) 113/58, pulse (!) 59, temperature 97.8 F (36.6 C), resp. rate 17, height 5\' 7"  (1.702 m), weight 72.6 kg, SpO2 95 %. PHYSICAL EXAMINATION:  Physical Exam HENT:     Head: Normocephalic and atraumatic.  Eyes:     Conjunctiva/sclera: Conjunctivae normal.     Pupils: Pupils are equal, round, and reactive to light.  Neck:     Thyroid: No thyromegaly.     Trachea: No tracheal deviation.  Cardiovascular:     Rate and Rhythm: Normal rate and regular rhythm.     Heart sounds: Normal heart sounds.  Pulmonary:     Effort: Pulmonary effort is normal. No respiratory  distress.     Breath sounds: Normal breath sounds. No wheezing.  Chest:     Chest wall: No tenderness.  Abdominal:     General: Bowel sounds are normal. There is no distension.     Palpations: Abdomen is soft.     Tenderness: There is no abdominal tenderness.  Musculoskeletal:        General: Normal range of motion.     Cervical back: Normal range of motion and neck supple.  Skin:    General: Skin is warm and dry.     Findings: No rash.  Neurological:     Mental Status: He is alert and oriented to person, place, and time.     Cranial Nerves: No cranial nerve deficit.    LABORATORY PANEL:  Male CBC Recent Labs  Lab 02/12/20 0635  WBC 8.4  HGB 11.2*  HCT 32.8*  PLT 326   ------------------------------------------------------------------------------------------------------------------ Chemistries  Recent Labs  Lab 02/12/20 0635  NA 139  K 3.5  CL 104  CO2 26  GLUCOSE 88  BUN 28*  CREATININE 1.48*  CALCIUM 8.1*  MG 2.2  AST 23  ALT 27  ALKPHOS 47  BILITOT 0.9   RADIOLOGY:  No results found. ASSESSMENT AND PLAN:  86 year old male with a history of paroxysmal A. fib, hypertension, CKD stage IIIb, BPH admitted for COVID-19 infection  COVID-19 infection -No pneumonia on chest x-ray. No fever or chills rapid Covid positive -Patient started with cough and weakness and poor appetite about 7 to 10 days prior -Remdesivir day 2/5, albuterol. No hypoxia -Follow inflammatory biomarkers  Generalized  weakness, related to COVID-19 -Patient with protracted weakness poor appetite -IV hydration, -Nutritionist consult - PT/OT -no skilled therapy need  Acute kidney injury superimposed on CKD 3b: -Creatinine 2.37->1.48, close to baseline now -Improved with IV hydration -Likely prerenal and related to poor oral intake over the past couple weeks related to acute illness  Essential hypertension -Hold metoprolol as blood pressure is low. Hold furosemide due to  AKI  Paroxysmal atrial fibrillation -Continue apixaban for anticoagulation, hold metoprolol as blood pressure is running low.  Heart rate is better controlled without any medication  Body mass index is 25.06 kg/m.      Status is: Inpatient  Remains inpatient appropriate because:Inpatient level of care appropriate due to severity of illness   Dispo: The patient is from: Home              Anticipated d/c is to: Home              Anticipated d/c date is: 3 days              Patient currently is not medically stable to d/c.  Requiring IV remdesivir for 3 more days   DVT prophylaxis:             apixaban (ELIQUIS) tablet 5 mg     Family Communication:  "discussed with patient"   All the records are reviewed and case discussed with Care Management/Social Worker. Management plans discussed with the patient, nursing and they are in agreement.  CODE STATUS: Full Code  TOTAL TIME TAKING CARE OF THIS PATIENT: 25 minutes.   More than 50% of the time was spent in counseling/coordination of care: YES  POSSIBLE D/C IN 3 DAYS, DEPENDING ON CLINICAL CONDITION.   Delfino Lovett M.D on 02/12/2020 at 12:41 PM  [facility]   CC: Primary care physician; Particia Nearing, PA-C  Note: This dictation was prepared with Dragon dictation along with smaller phrase technology. Any transcriptional errors that result from this process are unintentional.

## 2020-02-13 LAB — CBC WITH DIFFERENTIAL/PLATELET
Abs Immature Granulocytes: 0.16 10*3/uL — ABNORMAL HIGH (ref 0.00–0.07)
Basophils Absolute: 0 10*3/uL (ref 0.0–0.1)
Basophils Relative: 0 %
Eosinophils Absolute: 0.1 10*3/uL (ref 0.0–0.5)
Eosinophils Relative: 1 %
HCT: 36.8 % — ABNORMAL LOW (ref 39.0–52.0)
Hemoglobin: 12.3 g/dL — ABNORMAL LOW (ref 13.0–17.0)
Immature Granulocytes: 2 %
Lymphocytes Relative: 15 %
Lymphs Abs: 1.2 10*3/uL (ref 0.7–4.0)
MCH: 31.9 pg (ref 26.0–34.0)
MCHC: 33.4 g/dL (ref 30.0–36.0)
MCV: 95.6 fL (ref 80.0–100.0)
Monocytes Absolute: 0.7 10*3/uL (ref 0.1–1.0)
Monocytes Relative: 9 %
Neutro Abs: 5.6 10*3/uL (ref 1.7–7.7)
Neutrophils Relative %: 73 %
Platelets: 343 10*3/uL (ref 150–400)
RBC: 3.85 MIL/uL — ABNORMAL LOW (ref 4.22–5.81)
RDW: 14 % (ref 11.5–15.5)
WBC: 7.7 10*3/uL (ref 4.0–10.5)
nRBC: 0 % (ref 0.0–0.2)

## 2020-02-13 LAB — COMPREHENSIVE METABOLIC PANEL
ALT: 28 U/L (ref 0–44)
AST: 25 U/L (ref 15–41)
Albumin: 2.7 g/dL — ABNORMAL LOW (ref 3.5–5.0)
Alkaline Phosphatase: 56 U/L (ref 38–126)
Anion gap: 10 (ref 5–15)
BUN: 22 mg/dL (ref 8–23)
CO2: 26 mmol/L (ref 22–32)
Calcium: 8 mg/dL — ABNORMAL LOW (ref 8.9–10.3)
Chloride: 101 mmol/L (ref 98–111)
Creatinine, Ser: 1.19 mg/dL (ref 0.61–1.24)
GFR calc Af Amer: >60 mL/min (ref >60)
GFR calc non Af Amer: 55 mL/min — ABNORMAL LOW (ref >60)
Glucose, Bld: 108 mg/dL — ABNORMAL HIGH (ref 70–99)
Potassium: 3.5 mmol/L (ref 3.5–5.1)
Sodium: 137 mmol/L (ref 135–145)
Total Bilirubin: 0.8 mg/dL (ref 0.3–1.2)
Total Protein: 5.9 g/dL — ABNORMAL LOW (ref 6.5–8.1)

## 2020-02-13 LAB — C-REACTIVE PROTEIN: CRP: 6.6 mg/dL — ABNORMAL HIGH (ref <1.0)

## 2020-02-13 LAB — FERRITIN: Ferritin: 196 ng/mL (ref 24–336)

## 2020-02-13 LAB — PHOSPHORUS: Phosphorus: 2.3 mg/dL — ABNORMAL LOW (ref 2.5–4.6)

## 2020-02-13 LAB — FIBRIN DERIVATIVES D-DIMER (ARMC ONLY): Fibrin derivatives D-dimer (ARMC): 458.84 ng/mL (FEU) (ref 0.00–499.00)

## 2020-02-13 LAB — MAGNESIUM: Magnesium: 2 mg/dL (ref 1.7–2.4)

## 2020-02-13 NOTE — Progress Notes (Signed)
1        [place] at [facility]   PATIENT NAME: Caleb Fleming    MR#:  [id number]  DATE OF BIRTH:  [birth year]  SUBJECTIVE:  CHIEF COMPLAINT:   Chief Complaint  Patient presents with  . Headache  . Cough  Coughing, shortness of breath + REVIEW OF SYSTEMS:  Review of Systems  Constitutional: Positive for malaise/fatigue. Negative for diaphoresis, fever and weight loss.  HENT: Negative for ear discharge, ear pain, hearing loss, nosebleeds, sore throat and tinnitus.   Eyes: Negative for blurred vision and pain.  Respiratory: Positive for cough and shortness of breath. Negative for hemoptysis and wheezing.   Cardiovascular: Negative for chest pain, palpitations, orthopnea and leg swelling.  Gastrointestinal: Negative for abdominal pain, blood in stool, constipation, diarrhea, heartburn, nausea and vomiting.  Genitourinary: Negative for dysuria, frequency and urgency.  Musculoskeletal: Negative for back pain and myalgias.  Skin: Negative for itching and rash.  Neurological: Negative for dizziness, tingling, tremors, focal weakness, seizures, weakness and headaches.  Psychiatric/Behavioral: Negative for depression. The patient is not nervous/anxious.    DRUG ALLERGIES:   Allergies  Allergen Reactions  . Bactrim [Sulfamethoxazole-Trimethoprim] Itching  . Codeine    VITALS:  Blood pressure (!) 121/59, pulse 63, temperature 97.6 F (36.4 C), temperature source Oral, resp. rate (!) 24, height 5\' 7"  (1.702 m), weight 72.6 kg, SpO2 97 %. PHYSICAL EXAMINATION:  Physical Exam HENT:     Head: Normocephalic and atraumatic.  Eyes:     Conjunctiva/sclera: Conjunctivae normal.     Pupils: Pupils are equal, round, and reactive to light.  Neck:     Thyroid: No thyromegaly.     Trachea: No tracheal deviation.  Cardiovascular:     Rate and Rhythm: Normal rate and regular rhythm.     Heart sounds: Normal heart sounds.  Pulmonary:     Effort: Pulmonary effort is normal. No  respiratory distress.     Breath sounds: Normal breath sounds. No wheezing.  Chest:     Chest wall: No tenderness.  Abdominal:     General: Bowel sounds are normal. There is no distension.     Palpations: Abdomen is soft.     Tenderness: There is no abdominal tenderness.  Musculoskeletal:        General: Normal range of motion.     Cervical back: Normal range of motion and neck supple.  Skin:    General: Skin is warm and dry.     Findings: No rash.  Neurological:     Mental Status: He is alert and oriented to person, place, and time.     Cranial Nerves: No cranial nerve deficit.    LABORATORY PANEL:  Male CBC Recent Labs  Lab 02/13/20 0819  WBC 7.7  HGB 12.3*  HCT 36.8*  PLT 343   ------------------------------------------------------------------------------------------------------------------ Chemistries  Recent Labs  Lab 02/13/20 0819  NA 137  K 3.5  CL 101  CO2 26  GLUCOSE 108*  BUN 22  CREATININE 1.19  CALCIUM 8.0*  MG 2.0  AST 25  ALT 28  ALKPHOS 56  BILITOT 0.8   RADIOLOGY:  No results found. ASSESSMENT AND PLAN:  86 year old male with a history of paroxysmal A. fib, hypertension, CKD stage IIIb, BPH admitted for COVID-19 infection  COVID-19 infection -No pneumonia on chest x-ray. No fever or chills rapid Covid positive -Patient started with cough and weakness and poor appetite about 7 to 10 days prior -Remdesivir day 3/5, albuterol. No hypoxia -Follow inflammatory  biomarkers  Generalized weakness, related to COVID-19 -Patient with protracted weakness poor appetite -IV hydration, - PT/OT -no skilled therapy need  Acute kidney injury superimposed on CKD 3b: -Creatinine 2.37->1.19, close to baseline now -Improved with IV hydration -Likely prerenal and related to poor oral intake over the past couple weeks related to acute illness  Essential hypertension -Hold metoprolol as blood pressure is low. Hold furosemide due to AKI  Paroxysmal  atrial fibrillation -Continue apixaban for anticoagulation, hold metoprolol as blood pressure is running low.  Heart rate is better controlled without any medication  Body mass index is 25.06 kg/m.      Status is: Inpatient  Remains inpatient appropriate because:Inpatient level of care appropriate due to severity of illness   Dispo: The patient is from: Home              Anticipated d/c is to: Home              Anticipated d/c date is: 2 days              Patient currently is not medically stable to d/c.  Requiring IV remdesivir for 2 more days   DVT prophylaxis:             apixaban (ELIQUIS) tablet 5 mg     Family Communication:  "discussed with patient"   All the records are reviewed and case discussed with Care Management/Social Worker. Management plans discussed with the patient, nursing and they are in agreement.  CODE STATUS: Full Code  TOTAL TIME TAKING CARE OF THIS PATIENT: 25 minutes.   More than 50% of the time was spent in counseling/coordination of care: YES  POSSIBLE D/C IN 2 DAYS, DEPENDING ON CLINICAL CONDITION.   Delfino Lovett M.D on 02/13/2020 at 1:09 PM  [facility]   CC: Primary care physician; Particia Nearing, PA-C  Note: This dictation was prepared with Dragon dictation along with smaller phrase technology. Any transcriptional errors that result from this process are unintentional.

## 2020-02-13 NOTE — Plan of Care (Signed)

## 2020-02-14 ENCOUNTER — Other Ambulatory Visit: Payer: Self-pay | Admitting: Family Medicine

## 2020-02-14 LAB — CBC WITH DIFFERENTIAL/PLATELET
Abs Immature Granulocytes: 0.14 10*3/uL — ABNORMAL HIGH (ref 0.00–0.07)
Basophils Absolute: 0 10*3/uL (ref 0.0–0.1)
Basophils Relative: 0 %
Eosinophils Absolute: 0.1 10*3/uL (ref 0.0–0.5)
Eosinophils Relative: 2 %
HCT: 34 % — ABNORMAL LOW (ref 39.0–52.0)
Hemoglobin: 11.6 g/dL — ABNORMAL LOW (ref 13.0–17.0)
Immature Granulocytes: 2 %
Lymphocytes Relative: 16 %
Lymphs Abs: 1.1 10*3/uL (ref 0.7–4.0)
MCH: 32.4 pg (ref 26.0–34.0)
MCHC: 34.1 g/dL (ref 30.0–36.0)
MCV: 95 fL (ref 80.0–100.0)
Monocytes Absolute: 1 10*3/uL (ref 0.1–1.0)
Monocytes Relative: 13 %
Neutro Abs: 4.9 10*3/uL (ref 1.7–7.7)
Neutrophils Relative %: 67 %
Platelets: 300 10*3/uL (ref 150–400)
RBC: 3.58 MIL/uL — ABNORMAL LOW (ref 4.22–5.81)
RDW: 13.7 % (ref 11.5–15.5)
WBC: 7.2 10*3/uL (ref 4.0–10.5)
nRBC: 0 % (ref 0.0–0.2)

## 2020-02-14 LAB — COMPREHENSIVE METABOLIC PANEL
ALT: 25 U/L (ref 0–44)
AST: 22 U/L (ref 15–41)
Albumin: 2.4 g/dL — ABNORMAL LOW (ref 3.5–5.0)
Alkaline Phosphatase: 51 U/L (ref 38–126)
Anion gap: 6 (ref 5–15)
BUN: 18 mg/dL (ref 8–23)
CO2: 28 mmol/L (ref 22–32)
Calcium: 8.1 mg/dL — ABNORMAL LOW (ref 8.9–10.3)
Chloride: 103 mmol/L (ref 98–111)
Creatinine, Ser: 1.11 mg/dL (ref 0.61–1.24)
GFR calc Af Amer: >60 mL/min (ref >60)
GFR calc non Af Amer: 60 mL/min — ABNORMAL LOW (ref >60)
Glucose, Bld: 93 mg/dL (ref 70–99)
Potassium: 3.8 mmol/L (ref 3.5–5.1)
Sodium: 137 mmol/L (ref 135–145)
Total Bilirubin: 0.8 mg/dL (ref 0.3–1.2)
Total Protein: 5.6 g/dL — ABNORMAL LOW (ref 6.5–8.1)

## 2020-02-14 LAB — FIBRIN DERIVATIVES D-DIMER (ARMC ONLY): Fibrin derivatives D-dimer (ARMC): 436.86 ng/mL (FEU) (ref 0.00–499.00)

## 2020-02-14 LAB — FERRITIN: Ferritin: 180 ng/mL (ref 24–336)

## 2020-02-14 LAB — C-REACTIVE PROTEIN: CRP: 6.8 mg/dL — ABNORMAL HIGH (ref <1.0)

## 2020-02-14 LAB — PHOSPHORUS: Phosphorus: 2.7 mg/dL (ref 2.5–4.6)

## 2020-02-14 LAB — MAGNESIUM: Magnesium: 2 mg/dL (ref 1.7–2.4)

## 2020-02-14 NOTE — Telephone Encounter (Signed)
Requested Prescriptions  Pending Prescriptions Disp Refills  . potassium chloride (KLOR-CON) 10 MEQ tablet [Pharmacy Med Name: POTASSIUM CL ER 10 MEQ TABLET] 180 tablet 1    Sig: TAKE 1 TABLET (10 MEQ TOTAL) BY MOUTH 2 (TWO) TIMES DAILY.     Endocrinology:  Minerals - Potassium Supplementation Passed - 02/14/2020  9:32 AM      Passed - K in normal range and within 360 days    Potassium  Date Value Ref Range Status  02/14/2020 3.8 3.5 - 5.1 mmol/L Final         Passed - Cr in normal range and within 360 days    Creatinine, Ser  Date Value Ref Range Status  02/14/2020 1.11 0.61 - 1.24 mg/dL Final         Passed - Valid encounter within last 12 months    Recent Outpatient Visits          2 months ago Mixed hyperlipidemia   [facility] Roosvelt Maser [place], [place]   4 months ago Mixed hyperlipidemia   [facility] Roosvelt Maser [place], [place]   6 months ago Essential hypertension   [facility] Maser [place], [place]   1 year ago Essential hypertension   [facility] Maser [place], [place]   1 year ago Atrial fibrillation with rapid ventricular response [facility])   [facility] Particia Nearing, [place]

## 2020-02-14 NOTE — Progress Notes (Signed)
PROGRESS NOTE    Caleb Fleming.  QHU:[id number] DOB: May 31, 1932 DOA: 02/10/2020 PCP: Particia Nearing, PA-C   Brief Narrative: Taken from H&P Caleb Fleming. is a 86 y.o. male with medical history significant for paroxysmal atrial fibrillation, hypertension, CKD 3b, BPH, who presents to the emergency room with a 2-week history of generalized weakness, congested but nonproductive cough, poor appetite with decreased oral intake.  He states his son had Covid just over 2 weeks ago and soon thereafter he went to get a Covid test which was negative.  He got his first Covid vaccine at the same time.  His second Covid shot is due 02/14/2020.  Admitted for COVID-19 infection.  No hypoxia or x-ray changes.  Subjective: Patient has no new complaint today.  He was feeling better.  Assessment & Plan:   Principal Problem:   COVID-19 virus infection Active Problems:   Hypertension   Generalized weakness   AKI (acute kidney injury) (HCC)   AF (paroxysmal atrial fibrillation) (HCC)   Pneumonia due to COVID-19 virus  COVID-19 infection.  Saturating well on room air.  No radiological evidence of pneumonia.  He would have been a good candidate for monoclonal antibodies which are not available as an inpatient.  Elevated CRP. -He was started on remdesivir-day 4 today. -Continue supportive care. -Continue to monitor inflammatory markers.  Generalized weakness.  Most likely secondary to COVID-19 infection. -PT evaluation-no skilled recommendation. -Encourage nutritional diet.  AKI with CKD stage IIIb.  Creatinine at baseline now. -Continue to monitor. -Avoid nephrotoxins.  Essential hypertension.  Blood pressure within goal. Holding home dose of metoprolol and Lasix-can be restarted if needed.  Paroxysmal atrial fibrillation.  Currently rate controlled. -Continue home dose of apixaban. -Holding metoprolol due to softer blood pressure.  Body mass index is 25.06  kg/m.  Objective: Vitals:   02/14/20 0011 02/14/20 0435 02/14/20 0821 02/14/20 1246  BP: 110/69 110/61 (!) 114/53 (!) 107/56  Pulse: 63 63 61 (!) 57  Resp: 16 17 18 19   Temp: 97.7 F (36.5 C) 98.4 F (36.9 C) 98.5 F (36.9 C) (!) 97.5 F (36.4 C)  TempSrc: Oral Oral Oral Oral  SpO2: 97% 97% 98% 95%  Weight:      Height:        Intake/Output Summary (Last 24 hours) at 02/14/2020 1353 Last data filed at 02/14/2020 1255 Gross per 24 hour  Intake --  Output 1350 ml  Net -1350 ml   Filed Weights   02/10/20 1730  Weight: 72.6 kg    Examination:  General exam: Appears calm and comfortable  Respiratory system: Clear to auscultation. Respiratory effort normal. Cardiovascular system: S1 & S2 heard, RRR. No JVD, murmurs, Gastrointestinal system: Soft, nontender, nondistended, bowel sounds positive. Central nervous system: Alert and oriented. No focal neurological deficits. Extremities: No edema, no cyanosis, pulses intact and symmetrical. Skin: No rashes, lesions or ulcers Psychiatry: Judgement and insight appear normal.   DVT prophylaxis: Apixaban Code Status: Full Family Communication: Son was updated on phone Disposition Plan:  Status is: Inpatient  Remains inpatient appropriate because:Inpatient level of care appropriate due to severity of illness   Dispo: The patient is from: Home              Anticipated d/c is to: Home              Anticipated d/c date is: 1 day              Patient currently is not  medically stable to d/c.  Will complete remdesivir treatment tomorrow.  Consultants:   None  Procedures:  Antimicrobials:   Data Reviewed: I have personally reviewed following labs and imaging studies  CBC: Recent Labs  Lab 02/10/20 1734 02/12/20 0635 02/13/20 0819 02/14/20 0443  WBC 10.1 8.4 7.7 7.2  NEUTROABS  --  6.0 5.6 4.9  HGB 14.2 11.2* 12.3* 11.6*  HCT 41.2 32.8* 36.8* 34.0*  MCV 93.6 94.5 95.6 95.0  PLT 364 326 343 300   Basic Metabolic  Panel: Recent Labs  Lab 02/10/20 1734 02/12/20 0635 02/13/20 0819 02/14/20 0443  NA 137 139 137 137  K 3.5 3.5 3.5 3.8  CL 100 104 101 103  CO2 24 26 26 28   GLUCOSE 111* 88 108* 93  BUN 51* 28* 22 18  CREATININE 2.37* 1.48* 1.19 1.11  CALCIUM 8.5* 8.1* 8.0* 8.1*  MG  --  2.2 2.0 2.0  PHOS  --  2.9 2.3* 2.7   GFR: Estimated Creatinine Clearance: 44.7 mL/min (by C-G formula based on SCr of 1.11 mg/dL). Liver Function Tests: Recent Labs  Lab 02/12/20 0635 02/13/20 0819 02/14/20 0443  AST 23 25 22   ALT 27 28 25   ALKPHOS 47 56 51  BILITOT 0.9 0.8 0.8  PROT 5.7* 5.9* 5.6*  ALBUMIN 2.6* 2.7* 2.4*   No results for input(s): LIPASE, AMYLASE in the last 168 hours. No results for input(s): AMMONIA in the last 168 hours. Coagulation Profile: No results for input(s): INR, PROTIME in the last 168 hours. Cardiac Enzymes: Recent Labs  Lab 02/10/20 2359  CKTOTAL 20*   BNP (last 3 results) No results for input(s): PROBNP in the last 8760 hours. HbA1C: No results for input(s): HGBA1C in the last 72 hours. CBG: No results for input(s): GLUCAP in the last 168 hours. Lipid Profile: No results for input(s): CHOL, HDL, LDLCALC, TRIG, CHOLHDL, LDLDIRECT in the last 72 hours. Thyroid Function Tests: No results for input(s): TSH, T4TOTAL, FREET4, T3FREE, THYROIDAB in the last 72 hours. Anemia Panel: Recent Labs    02/13/20 0819 02/14/20 0443  FERRITIN 196 180   Sepsis Labs: No results for input(s): PROCALCITON, LATICACIDVEN in the last 168 hours.  Recent Results (from the past 240 hour(s))  SARS Coronavirus 2 by RT PCR (hospital order, performed in [facility] hospital lab) Nasopharyngeal Nasopharyngeal Swab     Status: Abnormal   Collection Time: 02/10/20  9:57 PM   Specimen: Nasopharyngeal Swab  Result Value Ref Range Status   SARS Coronavirus 2 POSITIVE (A) NEGATIVE Final    Comment: CRITICAL RESULT CALLED TO, READ BACK BY AND VERIFIED WITH: ASHEN PETERS @200  02/11/2020  TTG (NOTE) SARS-CoV-2 target nucleic acids are DETECTED  SARS-CoV-2 RNA is generally detectable in upper respiratory specimens  during the acute phase of infection.  Positive results are indicative  of the presence of the identified virus, but do not rule out bacterial infection or co-infection with other pathogens not detected by the test.  Clinical correlation with patient history and  other diagnostic information is necessary to determine patient infection status.  The expected result is negative.  Fact Sheet for Patients:   [facility]   Fact Sheet for Healthcare Providers:   04/11/20    This test is not yet approved or cleared by the FDA and  has been authorized for detection and/or diagnosis of SARS-CoV-2 by FDA under an Emergency Use Authorization (EUA).  This EUA will remain in effect (meaning th is test can be used)  for the duration of  the COVID-19 declaration under Section 564(b)(1) of the Act, 21 U.S.C. section 360-bbb-3(b)(1), unless the authorization is terminated or revoked sooner.  Performed at [facility], [address] [place], [place] [address]      Radiology Studies: No results found.  Scheduled Meds:  albuterol  2 puff Inhalation Q6H   apixaban  5 mg Oral BID   vitamin C  500 mg Oral Daily   aspirin EC  81 mg Oral Daily   multivitamin with minerals  1 tablet Oral Daily   simvastatin  20 mg Oral q1800   zinc sulfate  220 mg Oral Daily   Continuous Infusions:  remdesivir 100 mg in NS 100 mL 100 mg (02/14/20 0823)     LOS: 3 days   Time spent: 40 minutes.  Arnetha Courser, MD [facility]  If 7PM-7AM, please contact night-coverage [email or web address]  02/14/2020, 1:53 PM   This record has been created using Conservation officer, historic buildings. Errors have been sought and corrected,but may not always be located. Such creation errors do not reflect on  the standard of care.

## 2020-02-14 NOTE — Progress Notes (Signed)
Called patients son and updated him on his fathers condition. All questions answered.

## 2020-02-15 ENCOUNTER — Ambulatory Visit: Payer: Medicare Other

## 2020-02-15 LAB — CBC WITH DIFFERENTIAL/PLATELET
Abs Immature Granulocytes: 0.13 10*3/uL — ABNORMAL HIGH (ref 0.00–0.07)
Basophils Absolute: 0 10*3/uL (ref 0.0–0.1)
Basophils Relative: 0 %
Eosinophils Absolute: 0.1 10*3/uL (ref 0.0–0.5)
Eosinophils Relative: 2 %
HCT: 33.8 % — ABNORMAL LOW (ref 39.0–52.0)
Hemoglobin: 11.8 g/dL — ABNORMAL LOW (ref 13.0–17.0)
Immature Granulocytes: 2 %
Lymphocytes Relative: 15 %
Lymphs Abs: 1.2 10*3/uL (ref 0.7–4.0)
MCH: 32.2 pg (ref 26.0–34.0)
MCHC: 34.9 g/dL (ref 30.0–36.0)
MCV: 92.3 fL (ref 80.0–100.0)
Monocytes Absolute: 1.1 10*3/uL — ABNORMAL HIGH (ref 0.1–1.0)
Monocytes Relative: 13 %
Neutro Abs: 5.7 10*3/uL (ref 1.7–7.7)
Neutrophils Relative %: 68 %
Platelets: 313 10*3/uL (ref 150–400)
RBC: 3.66 MIL/uL — ABNORMAL LOW (ref 4.22–5.81)
RDW: 13.9 % (ref 11.5–15.5)
WBC: 8.3 10*3/uL (ref 4.0–10.5)
nRBC: 0 % (ref 0.0–0.2)

## 2020-02-15 LAB — C-REACTIVE PROTEIN: CRP: 9.3 mg/dL — ABNORMAL HIGH (ref <1.0)

## 2020-02-15 LAB — COMPREHENSIVE METABOLIC PANEL
ALT: 26 U/L (ref 0–44)
AST: 25 U/L (ref 15–41)
Albumin: 2.4 g/dL — ABNORMAL LOW (ref 3.5–5.0)
Alkaline Phosphatase: 54 U/L (ref 38–126)
Anion gap: 8 (ref 5–15)
BUN: 20 mg/dL (ref 8–23)
CO2: 27 mmol/L (ref 22–32)
Calcium: 8.2 mg/dL — ABNORMAL LOW (ref 8.9–10.3)
Chloride: 100 mmol/L (ref 98–111)
Creatinine, Ser: 1.2 mg/dL (ref 0.61–1.24)
GFR calc Af Amer: >60 mL/min (ref >60)
GFR calc non Af Amer: 54 mL/min — ABNORMAL LOW (ref >60)
Glucose, Bld: 97 mg/dL (ref 70–99)
Potassium: 4.5 mmol/L (ref 3.5–5.1)
Sodium: 135 mmol/L (ref 135–145)
Total Bilirubin: 0.8 mg/dL (ref 0.3–1.2)
Total Protein: 5.4 g/dL — ABNORMAL LOW (ref 6.5–8.1)

## 2020-02-15 LAB — PHOSPHORUS: Phosphorus: 2.7 mg/dL (ref 2.5–4.6)

## 2020-02-15 LAB — FERRITIN: Ferritin: 161 ng/mL (ref 24–336)

## 2020-02-15 LAB — MAGNESIUM: Magnesium: 2.2 mg/dL (ref 1.7–2.4)

## 2020-02-15 LAB — FIBRIN DERIVATIVES D-DIMER (ARMC ONLY): Fibrin derivatives D-dimer (ARMC): 472.86 ng/mL (FEU) (ref 0.00–499.00)

## 2020-02-15 MED ORDER — ADULT MULTIVITAMIN W/MINERALS CH: 1.0000 | ORAL_TABLET | Freq: Every day | ORAL | 0 refills | Status: DC

## 2020-02-15 MED ORDER — ZINC SULFATE 220 (50 ZN) MG PO CAPS: 220.0000 mg | ORAL_CAPSULE | Freq: Every day | ORAL | 0 refills | Status: DC

## 2020-02-15 MED ORDER — GUAIFENESIN-DM 100-10 MG/5ML PO SYRP: 10.0000 mL | ORAL_SOLUTION | ORAL | 0 refills | Status: DC | PRN

## 2020-02-15 MED ORDER — ASCORBIC ACID 500 MG PO TABS: 500.0000 mg | ORAL_TABLET | Freq: Every day | ORAL | 0 refills | Status: DC

## 2020-02-15 NOTE — Discharge Summary (Signed)
Physician Discharge Summary  Caleb Fleming. WRU:[id number] DOB: [birth year] DOA: 02/10/2020  PCP: Particia Nearing, PA-C  Admit date: 02/10/2020 Discharge date: 02/15/2020  Admitted From: Home Disposition:  Home  Recommendations for Outpatient Follow-up:  1. Follow up with PCP in 1-2 weeks 2. Follow-up with cardiology in 1 week 3. Please obtain BMP/CBC in one week 4. Please follow up on the following pending results: None  Home Health: No Equipment/Devices: None Discharge Condition: Stable CODE STATUS: Full Diet recommendation: Heart Healthy / Carb Modified   Brief/Interim Summary: Caleb Fleming.is a 86 y.o.malewith medical history significant forparoxysmal atrial fibrillation, hypertension, CKD 3b,BPH, who presents to the emergency room with a 2-week history of generalized weakness, congested but nonproductive cough, poor appetite with decreased oral intake. He states his son had Covid just over 2 weeks ago and soon thereafter he went to get a Covid test which was negative. He got his first Covid vaccine at the same time. His second Covid shot is due 02/14/2020.  He was positive for COVID-19 infection, there was no hypoxia or x-ray changes.  A course of remdesivir as there was no monoclonal antibodies available as inpatient.  Mildly elevated inflammatory markers which are trending down.  Due to his advanced age and complaint of generalized weakness, PT evaluation was obtained and there was no specific recommendations.  Patient has an history of paroxysmal atrial fibrillation and he was on amiodarone, metoprolol and apixaban at home.  We discontinued home dose of amiodarone and metoprolol due to bradycardia.  Lasix was also not continued on discharge due to softer blood pressure.  He was advised to follow-up with his cardiologist and primary care provider closely and those medications can be restarted as needed.  Discharge Diagnoses:  Principal Problem:   COVID-19  virus infection Active Problems:   Hypertension   Generalized weakness   AKI (acute kidney injury) (HCC)   AF (paroxysmal atrial fibrillation) (HCC)   Pneumonia due to COVID-19 virus  Discharge Instructions  Discharge Instructions    Diet - low sodium heart healthy   Complete by: As directed    Discharge instructions   Complete by: As directed    It was pleasure taking care of you. We are holding your amiodarone, lasix, metoprolol and potassium due to low heart rate and blood pressure.  Please follow-up with your primary care physician or cardiologist very closely and they can restart as needed. Keep yourself well-hydrated. Continue taking your supplement for next couple of month.   Increase activity slowly   Complete by: As directed      Allergies as of 02/15/2020      Reactions   Bactrim [sulfamethoxazole-trimethoprim] Itching   Codeine       Medication List    STOP taking these medications   amiodarone 200 MG tablet Commonly known as: PACERONE   furosemide 40 MG tablet Commonly known as: Lasix   metoprolol succinate 25 MG 24 hr tablet Commonly known as: TOPROL-XL   potassium chloride 10 MEQ tablet Commonly known as: KLOR-CON     TAKE these medications   acetaminophen 325 MG tablet Commonly known as: TYLENOL Take 650 mg by mouth every 4 (four) hours as needed.   allopurinol 100 MG tablet Commonly known as: ZYLOPRIM Take 1 tablet (100 mg total) by mouth daily.   apixaban 5 MG Tabs tablet Commonly known as: Eliquis Take 1 tablet (5 mg total) by mouth 2 (two) times daily.   ascorbic acid 500 MG tablet Commonly  known as: VITAMIN C Take 1 tablet (500 mg total) by mouth daily.   ASPIRIN EC PO Take 81 mg by mouth daily.   guaiFENesin-dextromethorphan 100-10 MG/5ML syrup Commonly known as: ROBITUSSIN DM Take 10 mLs by mouth every 4 (four) hours as needed for cough.   multivitamin with minerals Tabs tablet Take 1 tablet by mouth daily.   simvastatin 20  MG tablet Commonly known as: ZOCOR Take 1 tablet (20 mg total) by mouth daily at 6 PM.   Wixela Inhub 250-50 MCG/DOSE Aepb Generic drug: Fluticasone-Salmeterol Inhale 1 puff into the lungs 2 (two) times daily.   zinc sulfate 220 (50 Zn) MG capsule Take 1 capsule (220 mg total) by mouth daily.       Follow-up Information    Particia Nearing, PA-C. Go on 02/22/2020.   Specialty: Family Medicine Why: at 9:40 a.m. - virtuasl visit  Contact information: [address] [place] [address] [phone]        Schedule an appointment as soon as possible for a visit with Marcina Millard, MD.   Specialty: Cardiology Why: To be seen within 1 week Contact information: [address] [facility] [place] [address] [phone]              Allergies  Allergen Reactions  . Bactrim [Sulfamethoxazole-Trimethoprim] Itching  . Codeine     Consultations:  None  Procedures/Studies: DG Chest 2 View  Result Date: 02/10/2020 CLINICAL DATA:  86 year old male with cough. EXAM: CHEST - 2 VIEW COMPARISON:  Chest radiograph dated 12/11/2018 FINDINGS: No focal consolidation, pleural effusion, or pneumothorax. The cardiac silhouette is within limits atherosclerotic calcification of the aorta. Degenerative changes of the spine. Old healed right rib fractures. No acute osseous pathology. IMPRESSION: No active cardiopulmonary disease. Electronically Signed   By: Elgie Collard M.D.   On: 02/10/2020 18:02   CT Head Wo Contrast  Result Date: 02/10/2020 CLINICAL DATA:  Headache. EXAM: CT HEAD WITHOUT CONTRAST TECHNIQUE: Contiguous axial images were obtained from the base of the skull through the vertex without intravenous contrast. COMPARISON:  None. FINDINGS: Brain: There is mild to moderate severity cerebral atrophy with widening of the extra-axial spaces and ventricular dilatation. There are areas of decreased attenuation within the white matter tracts of  the supratentorial brain, consistent with microvascular disease changes. Vascular: No hyperdense vessel or unexpected calcification. Skull: Normal. Negative for fracture or focal lesion. Sinuses/Orbits: There is a small right maxillary sinus air-fluid level. Mild to moderate severity bilateral ethmoid sinus mucosal thickening is seen. Marked severity sphenoid sinus mucosal thickening is noted Other: None. IMPRESSION: 1. Generalized cerebral atrophy. 2. No acute intracranial abnormality. 3. Paranasal sinus disease, as described above. Electronically Signed   By: Aram Candela M.D.   On: 02/10/2020 22:20    Subjective: Patient continued to have some cough, no other complaint.  He was ready to go home.  Discharge Exam: Vitals:   02/15/20 0746 02/15/20 1130  BP: 98/79 (!) 95/51  Pulse: 62 (!) 59  Resp: 16 16  Temp: 97.9 F (36.6 C) 97.9 F (36.6 C)  SpO2: 91% 97%   Vitals:   02/15/20 0029 02/15/20 0436 02/15/20 0746 02/15/20 1130  BP: 136/60 (!) 142/59 98/79 (!) 95/51  Pulse: 62 67 62 (!) 59  Resp: 17 18 16 16   Temp: 99 F (37.2 C) 98.1 F (36.7 C) 97.9 F (36.6 C) 97.9 F (36.6 C)  TempSrc: Oral Oral  Oral  SpO2: 97% 98% 91% 97%  Weight:      Height:        General: Pt is alert, awake, not in acute distress Cardiovascular: RRR, S1/S2 +, no rubs, no gallops Respiratory: CTA bilaterally, no wheezing, no rhonchi Abdominal: Soft, NT, ND, bowel sounds + Extremities: no edema, no cyanosis   The results of significant diagnostics from this hospitalization (including imaging, microbiology, ancillary and laboratory) are listed below for reference.    Microbiology: Recent Results (from the past 240 hour(s))  SARS Coronavirus 2 by RT PCR (hospital order, performed in [facility] hospital lab) Nasopharyngeal Nasopharyngeal Swab     Status: Abnormal   Collection Time: 02/10/20  9:57 PM   Specimen: Nasopharyngeal Swab  Result Value Ref Range Status   SARS Coronavirus 2 POSITIVE (A)  NEGATIVE Final    Comment: CRITICAL RESULT CALLED TO, READ BACK BY AND VERIFIED WITH: ASHEN PETERS @200  02/11/2020 TTG (NOTE) SARS-CoV-2 target nucleic acids are DETECTED  SARS-CoV-2 RNA is generally detectable in upper respiratory specimens  during the acute phase of infection.  Positive results are indicative  of the presence of the identified virus, but do not rule out bacterial infection or co-infection with other pathogens not detected by the test.  Clinical correlation with patient history and  other diagnostic information is necessary to determine patient infection status.  The expected result is negative.  Fact Sheet for Patients:   04/12/2020   Fact Sheet for Healthcare Providers:   BoilerBrush.com.cy    This test is not yet approved or cleared by the https://pope.com/ FDA and  has been authorized for detection and/or diagnosis of SARS-CoV-2 by FDA under an Emergency Use Authorization (EUA).  This EUA will remain in effect (meaning th is test can be used) for the duration of  the COVID-19 declaration under Section 564(b)(1) of the Act, 21 U.S.C. section 360-bbb-3(b)(1), unless the authorization is terminated or revoked sooner.  Performed at [facility], [address] [place], [place]      Labs: BNP (last 3 results) No results for input(s): BNP in the last 8760 hours. Basic Metabolic Panel: Recent Labs  Lab 02/10/20 1734 02/12/20 0635 02/13/20 0819 02/14/20 0443 02/15/20 0613  NA 137 139 137 137 135  K 3.5 3.5 3.5 3.8 4.5  CL 100 104 101 103 100  CO2 24 26 26 28 27   GLUCOSE 111* 88 108* 93 97  BUN 51* 28* 22 18 20   CREATININE 2.37* 1.48* 1.19 1.11 1.20  CALCIUM 8.5* 8.1* 8.0* 8.1* 8.2*  MG  --  2.2 2.0 2.0 2.2  PHOS  --  2.9 2.3* 2.7 2.7   Liver Function Tests: Recent Labs  Lab 02/12/20 0635 02/13/20 0819 02/14/20 0443 02/15/20 0613  AST 23 25 22 25   ALT 27 28 25 26   ALKPHOS  47 56 51 54  BILITOT 0.9 0.8 0.8 0.8  PROT 5.7* 5.9* 5.6* 5.4*  ALBUMIN 2.6* 2.7* 2.4* 2.4*   No results for input(s): LIPASE, AMYLASE in the last 168 hours. No results for input(s): AMMONIA in the last 168 hours. CBC: Recent Labs  Lab 02/10/20 1734 02/12/20 0635 02/13/20 0819 02/14/20 0443 02/15/20 0613  WBC 10.1 8.4 7.7 7.2 8.3  NEUTROABS  --  6.0 5.6 4.9 5.7  HGB 14.2 11.2* 12.3* 11.6* 11.8*  HCT 41.2 32.8* 36.8* 34.0* 33.8*  MCV 93.6 94.5 95.6 95.0 92.3  PLT 364 326 343 300 313   Cardiac Enzymes: Recent Labs  Lab 02/10/20 2359  CKTOTAL 20*  BNP: Invalid input(s): POCBNP CBG: No results for input(s): GLUCAP in the last 168 hours. D-Dimer No results for input(s): DDIMER in the last 72 hours. Hgb A1c No results for input(s): HGBA1C in the last 72 hours. Lipid Profile No results for input(s): CHOL, HDL, LDLCALC, TRIG, CHOLHDL, LDLDIRECT in the last 72 hours. Thyroid function studies No results for input(s): TSH, T4TOTAL, T3FREE, THYROIDAB in the last 72 hours.  Invalid input(s): FREET3 Anemia work up Recent Labs    02/14/20 0443 02/15/20 0613  FERRITIN 180 161   Urinalysis    Component Value Date/Time   COLORURINE YELLOW (A) 12/08/2018 2052   APPEARANCEUR CLOUDY (A) 12/08/2018 2052   LABSPEC 1.021 12/08/2018 2052   PHURINE 5.0 12/08/2018 2052   GLUCOSEU NEGATIVE 12/08/2018 2052   HGBUR NEGATIVE 12/08/2018 2052   BILIRUBINUR NEGATIVE 12/08/2018 2052   KETONESUR 20 (A) 12/08/2018 2052   PROTEINUR 30 (A) 12/08/2018 2052   NITRITE NEGATIVE 12/08/2018 2052   LEUKOCYTESUR LARGE (A) 12/08/2018 2052   Sepsis Labs Invalid input(s): PROCALCITONIN,  WBC,  LACTICIDVEN Microbiology Recent Results (from the past 240 hour(s))  SARS Coronavirus 2 by RT PCR (hospital order, performed in [facility] hospital lab) Nasopharyngeal Nasopharyngeal Swab     Status: Abnormal   Collection Time: 02/10/20  9:57 PM   Specimen: Nasopharyngeal Swab  Result Value Ref Range  Status   SARS Coronavirus 2 POSITIVE (A) NEGATIVE Final    Comment: CRITICAL RESULT CALLED TO, READ BACK BY AND VERIFIED WITH: ASHEN PETERS @200  02/11/2020 TTG (NOTE) SARS-CoV-2 target nucleic acids are DETECTED  SARS-CoV-2 RNA is generally detectable in upper respiratory specimens  during the acute phase of infection.  Positive results are indicative  of the presence of the identified virus, but do not rule out bacterial infection or co-infection with other pathogens not detected by the test.  Clinical correlation with patient history and  other diagnostic information is necessary to determine patient infection status.  The expected result is negative.  Fact Sheet for Patients:   BoilerBrush.com.cyhttps://www.fda.gov/media/136312/download   Fact Sheet for Healthcare Providers:   https://pope.com/https://www.fda.gov/media/136313/download    This test is not yet approved or cleared by the [place] FDA and  has been authorized for detection and/or diagnosis of SARS-CoV-2 by FDA under an Emergency Use Authorization (EUA).  This EUA will remain in effect (meaning th is test can be used) for the duration of  the COVID-19 declaration under Section 564(b)(1) of the Act, 21 U.S.C. section 360-bbb-3(b)(1), unless the authorization is terminated or revoked sooner.  Performed at [facility], [address] [place], [place] [id number]     Time coordinating discharge: Over 30 minutes  SIGNED:  Arnetha CourserSumayya Rotha Cassels, MD  [facility] 02/15/2020, 12:44 PM  If 7PM-7AM, please contact night-coverage [email or web address]  This record has been created using Conservation officer, historic buildingsDragon voice recognition software. Errors have been sought and corrected,but may not always be located. Such creation errors do not reflect on the standard of care.

## 2020-02-15 NOTE — Care Management Important Message (Signed)
Important Message  Patient Details  Name: Caleb Fleming. MRN: [id number] Date of Birth: 12/18/1931   Medicare Important Message Given:  Yes     Allayne Butcher, RN 02/15/2020, 2:08 PM

## 2020-02-16 ENCOUNTER — Telehealth: Payer: Self-pay

## 2020-02-16 NOTE — Telephone Encounter (Signed)
This nurse again attempted to call patient for a TCM call. Message left.

## 2020-02-16 NOTE — Telephone Encounter (Signed)
This nurse attempted a TCM call this morning. Message left. Will try again later.

## 2020-02-22 ENCOUNTER — Encounter: Payer: Self-pay | Admitting: Family Medicine

## 2020-02-22 ENCOUNTER — Telehealth (INDEPENDENT_AMBULATORY_CARE_PROVIDER_SITE_OTHER): Payer: Medicare Other | Admitting: Family Medicine

## 2020-02-22 NOTE — Progress Notes (Signed)
Appt appears to be a hospital follow up, pt unable to do video visit or come for in person visit and also does not know why he's having this appt - looks like it was scheduled by daughter. Will reschedule for next available time to be seen in person - it appears he's due for basic labs to be repeated and needs examined. Pt was called to reschedule but didn't know when daughter would be able to take him. Daughter called with his permission to get appt scheduled, left VM.

## 2020-02-24 ENCOUNTER — Telehealth (INDEPENDENT_AMBULATORY_CARE_PROVIDER_SITE_OTHER): Payer: Medicare Other | Admitting: Family Medicine

## 2020-02-24 ENCOUNTER — Encounter: Payer: Self-pay | Admitting: Family Medicine

## 2020-02-24 NOTE — Progress Notes (Signed)
Pt unable to do video visit today, and also does not have a way to get vital signs for visit. Agreeable to rescheduling next week for IN PERSON visit in order to properly examine and perform repeat labs for his hospital follow up. Will [place] this visit.

## 2020-03-03 ENCOUNTER — Encounter: Payer: Self-pay | Admitting: Family Medicine

## 2020-03-03 ENCOUNTER — Other Ambulatory Visit: Payer: Self-pay

## 2020-03-03 ENCOUNTER — Ambulatory Visit (INDEPENDENT_AMBULATORY_CARE_PROVIDER_SITE_OTHER): Payer: Medicare Other | Admitting: Family Medicine

## 2020-03-03 VITALS — BP 145/76 | HR 51 | Temp 97.9°F | Wt 153.0 lb

## 2020-03-03 DIAGNOSIS — I4891 Unspecified atrial fibrillation: Secondary | ICD-10-CM

## 2020-03-03 DIAGNOSIS — I10 Essential (primary) hypertension: Secondary | ICD-10-CM

## 2020-03-03 NOTE — Assessment & Plan Note (Signed)
Vitals fairly stable today, in patient's usual baseline. Continue current regimen, declines making any changes to regimen today. Recheck basic labs

## 2020-03-03 NOTE — Assessment & Plan Note (Signed)
Rate a bit low today but near his baseline and pt asymptomatic. Continue current regimen with close monitoring

## 2020-03-03 NOTE — Progress Notes (Signed)
BP (!) 145/76   Pulse (!) 51   Temp 97.9 F (36.6 C) (Oral)   Wt 153 lb (69.4 kg)   SpO2 99%   BMI 23.96 kg/m    Subjective:    Patient ID: Caleb Fleming., male    DOB: [birth year], 86 y.o.   MRN: [id number]  HPI: Caleb Fleming is a 86 y.o. male  Chief Complaint  Patient presents with  . Hospitalization Follow-up   Patient presenting today for hospital follow up for COVID 19 pneumonia, weakness, dehydration with AKI, atrial fibrillation. 02/10/20. Feeling much better, no issues since d/c home with breathing, no fevers, chills, sweats, CP, SOB. Antihypertensives and potassium were held during hospital stay due to dehydration with AKI and soft BPs. Per patient, he was not aware of this and restarted his typical regimen as soon as he was discharged. Doing well, feeling back to usual state of health. Does not check home vital signs, states he does not have any equipment to do so.   Relevant past medical, surgical, family and social history reviewed and updated as indicated. Interim medical history since our last visit reviewed. Allergies and medications reviewed and updated.  Review of Systems  Per HPI unless specifically indicated above     Objective:    BP (!) 145/76   Pulse (!) 51   Temp 97.9 F (36.6 C) (Oral)   Wt 153 lb (69.4 kg)   SpO2 99%   BMI 23.96 kg/m   Wt Readings from Last 3 Encounters:  03/03/20 153 lb (69.4 kg)  02/22/20 160 lb (72.6 kg)  02/10/20 160 lb (72.6 kg)    Physical Exam Vitals and nursing note reviewed.  Constitutional:      Appearance: Normal appearance.  HENT:     Head: Atraumatic.  Eyes:     Extraocular Movements: Extraocular movements intact.     Conjunctiva/sclera: Conjunctivae normal.  Cardiovascular:     Rate and Rhythm: Normal rate.  Pulmonary:     Effort: Pulmonary effort is normal. No respiratory distress.     Breath sounds: Normal breath sounds. No wheezing or rales.  Musculoskeletal:        General: Normal  range of motion.     Cervical back: Normal range of motion and neck supple.  Skin:    General: Skin is warm and dry.  Neurological:     General: No focal deficit present.     Mental Status: He is oriented to person, place, and time.  Psychiatric:        Mood and Affect: Mood normal.        Thought Content: Thought content normal.        Judgment: Judgment normal.     Results for orders placed or performed during the hospital encounter of 02/10/20  SARS Coronavirus 2 by RT PCR (hospital order, performed in [facility] hospital lab) Nasopharyngeal Nasopharyngeal Swab   Specimen: Nasopharyngeal Swab  Result Value Ref Range   SARS Coronavirus 2 POSITIVE (A) NEGATIVE  Basic metabolic panel  Result Value Ref Range   Sodium 137 135 - 145 mmol/L   Potassium 3.5 3.5 - 5.1 mmol/L   Chloride 100 98 - 111 mmol/L   CO2 24 22 - 32 mmol/L   Glucose, Bld 111 (H) 70 - 99 mg/dL   BUN 51 (H) 8 - 23 mg/dL   Creatinine, Ser 3.15 (H) 0.61 - 1.24 mg/dL   Calcium 8.5 (L) 8.9 - 10.3 mg/dL   GFR  calc non Af Amer 24 (L) >60 mL/min   GFR calc Af Amer 28 (L) >60 mL/min   Anion gap 13 5 - 15  CBC  Result Value Ref Range   WBC 10.1 4.0 - 10.5 K/uL   RBC 4.40 4.22 - 5.81 MIL/uL   Hemoglobin 14.2 13.0 - 17.0 g/dL   HCT 16.141.2 39 - 52 %   MCV 93.6 80.0 - 100.0 fL   MCH 32.3 26.0 - 34.0 pg   MCHC 34.5 30.0 - 36.0 g/dL   RDW 09.613.5 04.511.5 - 40.915.5 %   Platelets 364 150 - 400 K/uL   nRBC 0.0 0.0 - 0.2 %  CK  Result Value Ref Range   Total CK 20 (L) 49.0 - 397.0 U/L  CBC with Differential/Platelet  Result Value Ref Range   WBC 8.4 4.0 - 10.5 K/uL   RBC 3.47 (L) 4.22 - 5.81 MIL/uL   Hemoglobin 11.2 (L) 13.0 - 17.0 g/dL   HCT 81.132.8 (L) 39 - 52 %   MCV 94.5 80.0 - 100.0 fL   MCH 32.3 26.0 - 34.0 pg   MCHC 34.1 30.0 - 36.0 g/dL   RDW 91.413.9 78.211.5 - 95.615.5 %   Platelets 326 150 - 400 K/uL   nRBC 0.0 0.0 - 0.2 %   Neutrophils Relative % 72 %   Neutro Abs 6.0 1.7 - 7.7 K/uL   Lymphocytes Relative 12 %   Lymphs  Abs 1.0 0.7 - 4.0 K/uL   Monocytes Relative 12 %   Monocytes Absolute 1.0 0 - 1 K/uL   Eosinophils Relative 1 %   Eosinophils Absolute 0.1 0 - 0 K/uL   Basophils Relative 0 %   Basophils Absolute 0.0 0 - 0 K/uL   Immature Granulocytes 3 %   Abs Immature Granulocytes 0.29 (H) 0.00 - 0.07 K/uL  Comprehensive metabolic panel  Result Value Ref Range   Sodium 139 135 - 145 mmol/L   Potassium 3.5 3.5 - 5.1 mmol/L   Chloride 104 98 - 111 mmol/L   CO2 26 22 - 32 mmol/L   Glucose, Bld 88 70 - 99 mg/dL   BUN 28 (H) 8 - 23 mg/dL   Creatinine, Ser 2.131.48 (H) 0.61 - 1.24 mg/dL   Calcium 8.1 (L) 8.9 - 10.3 mg/dL   Total Protein 5.7 (L) 6.5 - 8.1 g/dL   Albumin 2.6 (L) 3.5 - 5.0 g/dL   AST 23 15 - 41 U/L   ALT 27 0 - 44 U/L   Alkaline Phosphatase 47 38 - 126 U/L   Total Bilirubin 0.9 0.3 - 1.2 mg/dL   GFR calc non Af Amer 42 (L) >60 mL/min   GFR calc Af Amer 49 (L) >60 mL/min   Anion gap 9 5 - 15  C-reactive protein  Result Value Ref Range   CRP 5.9 (H) <1.0 mg/dL  Fibrin derivatives D-Dimer ([facility] only)  Result Value Ref Range   Fibrin derivatives D-dimer ([facility]) 367.92 0.00 - 499.00 ng/mL (FEU)  Ferritin  Result Value Ref Range   Ferritin 206 24 - 336 ng/mL  Magnesium  Result Value Ref Range   Magnesium 2.2 1.7 - 2.4 mg/dL  Phosphorus  Result Value Ref Range   Phosphorus 2.9 2.5 - 4.6 mg/dL  CBC with Differential/Platelet  Result Value Ref Range   WBC 7.7 4.0 - 10.5 K/uL   RBC 3.85 (L) 4.22 - 5.81 MIL/uL   Hemoglobin 12.3 (L) 13.0 - 17.0 g/dL   HCT 08.636.8 (L) 39 -  52 %   MCV 95.6 80.0 - 100.0 fL   MCH 31.9 26.0 - 34.0 pg   MCHC 33.4 30.0 - 36.0 g/dL   RDW 62.9 47.6 - 54.6 %   Platelets 343 150 - 400 K/uL   nRBC 0.0 0.0 - 0.2 %   Neutrophils Relative % 73 %   Neutro Abs 5.6 1.7 - 7.7 K/uL   Lymphocytes Relative 15 %   Lymphs Abs 1.2 0.7 - 4.0 K/uL   Monocytes Relative 9 %   Monocytes Absolute 0.7 0 - 1 K/uL   Eosinophils Relative 1 %   Eosinophils Absolute 0.1 0 - 0 K/uL    Basophils Relative 0 %   Basophils Absolute 0.0 0 - 0 K/uL   Immature Granulocytes 2 %   Abs Immature Granulocytes 0.16 (H) 0.00 - 0.07 K/uL  Comprehensive metabolic panel  Result Value Ref Range   Sodium 137 135 - 145 mmol/L   Potassium 3.5 3.5 - 5.1 mmol/L   Chloride 101 98 - 111 mmol/L   CO2 26 22 - 32 mmol/L   Glucose, Bld 108 (H) 70 - 99 mg/dL   BUN 22 8 - 23 mg/dL   Creatinine, Ser 5.03 0.61 - 1.24 mg/dL   Calcium 8.0 (L) 8.9 - 10.3 mg/dL   Total Protein 5.9 (L) 6.5 - 8.1 g/dL   Albumin 2.7 (L) 3.5 - 5.0 g/dL   AST 25 15 - 41 U/L   ALT 28 0 - 44 U/L   Alkaline Phosphatase 56 38 - 126 U/L   Total Bilirubin 0.8 0.3 - 1.2 mg/dL   GFR calc non Af Amer 55 (L) >60 mL/min   GFR calc Af Amer >60 >60 mL/min   Anion gap 10 5 - 15  C-reactive protein  Result Value Ref Range   CRP 6.6 (H) <1.0 mg/dL  Fibrin derivatives D-Dimer ([facility] only)  Result Value Ref Range   Fibrin derivatives D-dimer ([facility]) 458.84 0.00 - 499.00 ng/mL (FEU)  Ferritin  Result Value Ref Range   Ferritin 196 24 - 336 ng/mL  Magnesium  Result Value Ref Range   Magnesium 2.0 1.7 - 2.4 mg/dL  Phosphorus  Result Value Ref Range   Phosphorus 2.3 (L) 2.5 - 4.6 mg/dL  CBC with Differential/Platelet  Result Value Ref Range   WBC 7.2 4.0 - 10.5 K/uL   RBC 3.58 (L) 4.22 - 5.81 MIL/uL   Hemoglobin 11.6 (L) 13.0 - 17.0 g/dL   HCT 54.6 (L) 39 - 52 %   MCV 95.0 80.0 - 100.0 fL   MCH 32.4 26.0 - 34.0 pg   MCHC 34.1 30.0 - 36.0 g/dL   RDW 56.8 12.7 - 51.7 %   Platelets 300 150 - 400 K/uL   nRBC 0.0 0.0 - 0.2 %   Neutrophils Relative % 67 %   Neutro Abs 4.9 1.7 - 7.7 K/uL   Lymphocytes Relative 16 %   Lymphs Abs 1.1 0.7 - 4.0 K/uL   Monocytes Relative 13 %   Monocytes Absolute 1.0 0 - 1 K/uL   Eosinophils Relative 2 %   Eosinophils Absolute 0.1 0 - 0 K/uL   Basophils Relative 0 %   Basophils Absolute 0.0 0 - 0 K/uL   Immature Granulocytes 2 %   Abs Immature Granulocytes 0.14 (H) 0.00 - 0.07 K/uL    Comprehensive metabolic panel  Result Value Ref Range   Sodium 137 135 - 145 mmol/L   Potassium 3.8 3.5 - 5.1 mmol/L  Chloride 103 98 - 111 mmol/L   CO2 28 22 - 32 mmol/L   Glucose, Bld 93 70 - 99 mg/dL   BUN 18 8 - 23 mg/dL   Creatinine, Ser 1.61 0.61 - 1.24 mg/dL   Calcium 8.1 (L) 8.9 - 10.3 mg/dL   Total Protein 5.6 (L) 6.5 - 8.1 g/dL   Albumin 2.4 (L) 3.5 - 5.0 g/dL   AST 22 15 - 41 U/L   ALT 25 0 - 44 U/L   Alkaline Phosphatase 51 38 - 126 U/L   Total Bilirubin 0.8 0.3 - 1.2 mg/dL   GFR calc non Af Amer 60 (L) >60 mL/min   GFR calc Af Amer >60 >60 mL/min   Anion gap 6 5 - 15  C-reactive protein  Result Value Ref Range   CRP 6.8 (H) <1.0 mg/dL  Fibrin derivatives D-Dimer ([facility] only)  Result Value Ref Range   Fibrin derivatives D-dimer ([facility]) 436.86 0.00 - 499.00 ng/mL (FEU)  Ferritin  Result Value Ref Range   Ferritin 180 24 - 336 ng/mL  Magnesium  Result Value Ref Range   Magnesium 2.0 1.7 - 2.4 mg/dL  Phosphorus  Result Value Ref Range   Phosphorus 2.7 2.5 - 4.6 mg/dL  CBC with Differential/Platelet  Result Value Ref Range   WBC 8.3 4.0 - 10.5 K/uL   RBC 3.66 (L) 4.22 - 5.81 MIL/uL   Hemoglobin 11.8 (L) 13.0 - 17.0 g/dL   HCT 09.6 (L) 39 - 52 %   MCV 92.3 80.0 - 100.0 fL   MCH 32.2 26.0 - 34.0 pg   MCHC 34.9 30.0 - 36.0 g/dL   RDW 04.5 40.9 - 81.1 %   Platelets 313 150 - 400 K/uL   nRBC 0.0 0.0 - 0.2 %   Neutrophils Relative % 68 %   Neutro Abs 5.7 1.7 - 7.7 K/uL   Lymphocytes Relative 15 %   Lymphs Abs 1.2 0.7 - 4.0 K/uL   Monocytes Relative 13 %   Monocytes Absolute 1.1 (H) 0 - 1 K/uL   Eosinophils Relative 2 %   Eosinophils Absolute 0.1 0 - 0 K/uL   Basophils Relative 0 %   Basophils Absolute 0.0 0 - 0 K/uL   Immature Granulocytes 2 %   Abs Immature Granulocytes 0.13 (H) 0.00 - 0.07 K/uL  Comprehensive metabolic panel  Result Value Ref Range   Sodium 135 135 - 145 mmol/L   Potassium 4.5 3.5 - 5.1 mmol/L   Chloride 100 98 - 111 mmol/L   CO2 27  22 - 32 mmol/L   Glucose, Bld 97 70 - 99 mg/dL   BUN 20 8 - 23 mg/dL   Creatinine, Ser 9.14 0.61 - 1.24 mg/dL   Calcium 8.2 (L) 8.9 - 10.3 mg/dL   Total Protein 5.4 (L) 6.5 - 8.1 g/dL   Albumin 2.4 (L) 3.5 - 5.0 g/dL   AST 25 15 - 41 U/L   ALT 26 0 - 44 U/L   Alkaline Phosphatase 54 38 - 126 U/L   Total Bilirubin 0.8 0.3 - 1.2 mg/dL   GFR calc non Af Amer 54 (L) >60 mL/min   GFR calc Af Amer >60 >60 mL/min   Anion gap 8 5 - 15  C-reactive protein  Result Value Ref Range   CRP 9.3 (H) <1.0 mg/dL  Fibrin derivatives D-Dimer ([facility] only)  Result Value Ref Range   Fibrin derivatives D-dimer ([facility]) 472.86 0.00 - 499.00 ng/mL (FEU)  Ferritin  Result Value  Ref Range   Ferritin 161 24 - 336 ng/mL  Magnesium  Result Value Ref Range   Magnesium 2.2 1.7 - 2.4 mg/dL  Phosphorus  Result Value Ref Range   Phosphorus 2.7 2.5 - 4.6 mg/dL  POC SARS Coronavirus 2 Ag  Result Value Ref Range   SARS Coronavirus 2 Ag POSITIVE (A) NEGATIVE  ABO/Rh  Result Value Ref Range   ABO/RH(D)      O NEG Performed at [facility], [address] [place], [place] [address]   Troponin I (High Sensitivity)  Result Value Ref Range   Troponin I (High Sensitivity) 5 <18 ng/L      Assessment & Plan:   Problem List Items Addressed This Visit      Cardiovascular and Mediastinum   Hypertension    Vitals fairly stable today, in patient's usual baseline. Continue current regimen, declines making any changes to regimen today. Recheck basic labs      Relevant Medications   furosemide (LASIX) 40 MG tablet   amiodarone (PACERONE) 200 MG tablet   metoprolol succinate (TOPROL-XL) 25 MG 24 hr tablet   Other Relevant Orders   Comprehensive metabolic panel   Atrial fibrillation with rapid ventricular response (HCC) - Primary    Rate a bit low today but near his baseline and pt asymptomatic. Continue current regimen with close monitoring      Relevant Medications   furosemide (LASIX) 40 MG tablet    amiodarone (PACERONE) 200 MG tablet   metoprolol succinate (TOPROL-XL) 25 MG 24 hr tablet   Other Relevant Orders   CBC with Differential/Platelet       Follow up plan: Return for as scheduled in Nov.

## 2020-03-04 ENCOUNTER — Other Ambulatory Visit: Payer: Self-pay | Admitting: Family Medicine

## 2020-03-04 DIAGNOSIS — E876 Hypokalemia: Secondary | ICD-10-CM

## 2020-03-04 LAB — COMPREHENSIVE METABOLIC PANEL
ALT: 19 IU/L (ref 0–44)
AST: 19 IU/L (ref 0–40)
Albumin/Globulin Ratio: 1.5 (ref 1.2–2.2)
Albumin: 3.8 g/dL (ref 3.6–4.6)
Alkaline Phosphatase: 86 IU/L (ref 48–121)
BUN/Creatinine Ratio: 11 (ref 10–24)
BUN: 20 mg/dL (ref 8–27)
Bilirubin Total: 0.4 mg/dL (ref 0.0–1.2)
CO2: 27 mmol/L (ref 20–29)
Calcium: 8.9 mg/dL (ref 8.6–10.2)
Chloride: 99 mmol/L (ref 96–106)
Creatinine, Ser: 1.86 mg/dL — ABNORMAL HIGH (ref 0.76–1.27)
GFR calc Af Amer: 37 mL/min/{1.73_m2} — ABNORMAL LOW (ref >59)
GFR calc non Af Amer: 32 mL/min/{1.73_m2} — ABNORMAL LOW (ref >59)
Globulin, Total: 2.6 g/dL (ref 1.5–4.5)
Glucose: 93 mg/dL (ref 65–99)
Potassium: 3.4 mmol/L — ABNORMAL LOW (ref 3.5–5.2)
Sodium: 140 mmol/L (ref 134–144)
Total Protein: 6.4 g/dL (ref 6.0–8.5)

## 2020-03-04 LAB — CBC WITH DIFFERENTIAL/PLATELET
Basophils Absolute: 0.1 10*3/uL (ref 0.0–0.2)
Basos: 1 %
EOS (ABSOLUTE): 0.5 10*3/uL — ABNORMAL HIGH (ref 0.0–0.4)
Eos: 5 %
Hematocrit: 37.6 % (ref 37.5–51.0)
Hemoglobin: 12.9 g/dL — ABNORMAL LOW (ref 13.0–17.7)
Immature Grans (Abs): 0.1 10*3/uL (ref 0.0–0.1)
Immature Granulocytes: 1 %
Lymphocytes Absolute: 2 10*3/uL (ref 0.7–3.1)
Lymphs: 20 %
MCH: 32.5 pg (ref 26.6–33.0)
MCHC: 34.3 g/dL (ref 31.5–35.7)
MCV: 95 fL (ref 79–97)
Monocytes Absolute: 1.1 10*3/uL — ABNORMAL HIGH (ref 0.1–0.9)
Monocytes: 10 %
Neutrophils Absolute: 6.5 10*3/uL (ref 1.4–7.0)
Neutrophils: 63 %
Platelets: 246 10*3/uL (ref 150–450)
RBC: 3.97 x10E6/uL — ABNORMAL LOW (ref 4.14–5.80)
RDW: 14.1 % (ref 11.6–15.4)
WBC: 10.1 10*3/uL (ref 3.4–10.8)

## 2020-03-08 ENCOUNTER — Other Ambulatory Visit: Payer: Self-pay

## 2020-03-09 ENCOUNTER — Other Ambulatory Visit: Payer: Medicare Other

## 2020-03-09 ENCOUNTER — Other Ambulatory Visit: Payer: Self-pay

## 2020-03-09 DIAGNOSIS — E876 Hypokalemia: Secondary | ICD-10-CM

## 2020-03-10 LAB — BASIC METABOLIC PANEL
BUN/Creatinine Ratio: 14 (ref 10–24)
BUN: 25 mg/dL (ref 8–27)
CO2: 26 mmol/L (ref 20–29)
Calcium: 8.9 mg/dL (ref 8.6–10.2)
Chloride: 102 mmol/L (ref 96–106)
Creatinine, Ser: 1.81 mg/dL — ABNORMAL HIGH (ref 0.76–1.27)
GFR calc Af Amer: 38 mL/min/{1.73_m2} — ABNORMAL LOW (ref >59)
GFR calc non Af Amer: 33 mL/min/{1.73_m2} — ABNORMAL LOW (ref >59)
Glucose: 96 mg/dL (ref 65–99)
Potassium: 3.8 mmol/L (ref 3.5–5.2)
Sodium: 145 mmol/L — ABNORMAL HIGH (ref 134–144)

## 2020-03-11 ENCOUNTER — Ambulatory Visit: Payer: Self-pay | Admitting: General Practice

## 2020-03-11 ENCOUNTER — Telehealth: Payer: Medicare Other | Admitting: General Practice

## 2020-03-11 DIAGNOSIS — I10 Essential (primary) hypertension: Secondary | ICD-10-CM

## 2020-03-11 DIAGNOSIS — I4891 Unspecified atrial fibrillation: Secondary | ICD-10-CM

## 2020-03-11 DIAGNOSIS — U071 COVID-19: Secondary | ICD-10-CM

## 2020-03-11 DIAGNOSIS — E782 Mixed hyperlipidemia: Secondary | ICD-10-CM

## 2020-03-11 NOTE — Patient Instructions (Signed)
Visit Information  Goals Addressed              This Visit's Progress     COMPLETED: RNCM- Pt's daughter in law: "I am sure he has COVID" "His breathing is getting worse" (pt-stated)        CARE PLAN ENTRY (see longtitudinal plan of care for additional care plan information)  Current Barriers: Goal being closed. The patient did go to the hospital and was admitted for COVID pneumonia.  The patient is back home and doing well.  No further complications from COVID.   Knowledge Deficits related to COVID-19 and impact on patient self health management  Son and daughter in law have tested positive for COVID 19 and the patient lives in the home with them.   Decline in the patients health over the last several days. Patient per the daughter in law [name] has declined going to the ER  Daughter in law [name] verbalized the patient is "getting sicker and his breathing is worse"  Patient refusing for family to call 911  Clinical Goal(s):   Over the next 30 days, patient will verbalize basic understanding of COVID-19 impact on individual health and self health management as evidenced by verbalization of basic understanding of COVID-19 as a viral disease, measures to prevent exposure, signs and symptoms, when to contact provider  Over the next 4 hours the patient will be evaluated and treated in the ER for worsening sx/sx of suspected COVID19  Interventions:  Evaluation of current sx/sx.  Per [name] the daughter in law the patient has been getting worse by the day and pleas for him to go to the ER have not been successful. The son and daughter in law both have tested positive for COVID19 and suspect the patient has COVID 19 also.  The daughter in law says he is having increased shortness of breath. Educated on the importance of the patient being evaluated immediately for worsening sx/sx expressed. The daughter in law states that as soon as she takes her grandkids home she will take the patient to the ER.  Explained with urgency this is not something that could wait and the patient needed to go to the hospital by 911 services. Spoke directly to the patient and advised the patient the concern of his worsening sx/sx and the need to be evaluated. The patient states he is not going to ride the ambulance to the hospital. Agreed to let the daughter in law take him to the hospital.    Advised patient  to the call 911 and be evaluated in the ER immediately due to decline in breathing and worsening condition.   Provided education to patient re: urgency of evaluation and treatment due to worsening sx/sx and how serious the COVID 19 virus is on the elderly population and with his chronic health conditions it puts him at higher risk of complications including death if untreated. The daughter in law [name] was going to take the patient to the hospital and ended the call. Will continue to monitor for changes and assistance as needed.   Patient Self Care Activities:   Patient verbalizes understanding of plan to be evaluated for worsening sx/sx related to possible COVID19.  Calls provider office for new concerns or questions  Call 911 or go directly to the ER for evaluation and treatment  Please see past updates related to this goal by clicking on the "Past Updates" button in the selected goal  RNCM: Pt-"He does not have a way of checking his blood pressure, we still can not afford 2 of his medications" (pt-stated)        CARE PLAN ENTRY (see longtitudinal plan of care for additional care plan information)  Current Barriers:   Chronic Disease Management support, education, and care coordination needs related to Atrial Fibrillation, HTN, and HLD  Financial Constraints  Clinical Goal(s) related to Atrial Fibrillation, HTN, and HLD:  Over the next 90 days, patient will:   Work with the care management team to address educational, disease management, and care coordination needs    Begin or continue self health monitoring activities as directed today Measure and record blood pressure 4 times per week and take medications as prescribed   Call provider office for new or worsened signs and symptoms Blood pressure findings outside established parameters and New or worsened symptom related to HLD and AFIB  Call care management team with questions or concerns  Verbalize basic understanding of patient centered plan of care established today  Over the next 90 days, Daughter in law will call [facility] and talk to customer service about the over the counter benefit and life alert system for the patient to get blood pressure cuff and system to help with safety  Interventions related to Atrial Fibrillation, HTN, and HLD:   Evaluation of current treatment plans and patient's adherence to plan as established by provider- per the daughter in law the patient is still not taking the Metoprolol and Zocor due to affordability One is >$100 and the other is $47.00. 03-11-2020: The patients DIL states she went to pick up his Eliquis and they told her it would cost >300.00. They had filled the script for 90 days instead of 30. It was 47.00 with just a 30 day supply. The DIL will talk to the pharmacist next week about other options.   Assessed patient understanding of disease states- daughter in law helps the patient manage his care.  03-11-2020: The patient's DIL states that the patient is doing well since getting out of the hospital. He has had no further issues related to COVID19.  The patient was taking a nap at the time of the call.  Assessed patient's education and care coordination needs- denies the need for social work at this time but would like to talk to the pharmacist to assist with cost and resources to help pay for medications. 03-11-2020: Currently working with the pharmacist to get medications at an affordable price.   Provided disease specific education to patient: Heart Healthy diet  options, taking medications as prescribed, education and support on checking and recording blood pressure daily for optimal management of HTN.  03-11-2020: The patient per the daughter is eating well and adhering to dietary restrictions. The patient is doing well and no new complaints at this time.   Collaborated with appropriate clinical care team members regarding patient needs. 03-11-2020: Currently working with the pharm D. Denies any needs from the LCSW but knows they are available as needed.   Educated the daughter in Social worker to call  [facility] customer service number on the back of the patients insurance card and request the OTC catalog to be sent and information on obtaining the [employer] system free of charge  Sent EMAIL to [email or web address] so the daughter in law can sign up for myChart and see lab work, communicate with the healthcare team and educational information- 02-10-2020: Has not signed up yet. Will discuss further as today's call was  an urgent call for decline in condition.   Patient Self Care Activities related to Atrial Fibrillation, HTN, and HLD:   Patient is unable to independently self-manage chronic health conditions  Please see past updates related to this goal by clicking on the "Past Updates" button in the selected goal         Patient verbalizes understanding of instructions provided today.   Telephone follow up appointment with care management team member scheduled for: 05-13-2020 at 1 pm  [name] RN, MSN, CCM Community Care Coordinator [place]   [facility] Mobile: [phone]

## 2020-03-11 NOTE — Progress Notes (Signed)
Please let Caleb Fleming know he continues to show kidney disease on labs at baseline, but potassium has improved.  Sodium slightly elevated, would decrease salt intake and increase water intake.  Will recheck labs on next visit.  If any questions let us know.  Have a good day!!

## 2020-03-11 NOTE — Chronic Care Management (AMB) (Signed)
Chronic Care Management   Follow Up Note   03/11/2020 Name: Wyeth Hoffer. MRN: [id number] DOB: 1932/02/26  Referred by: Particia Nearing, PA-C Reason for referral : Chronic Care Management (RNCM Follow up for Chronic Disease Management and Care Coordination Needs)   Tyquan Carmickle. is a 86 y.o. year old male who is a primary care patient of Particia Nearing, [place]. The CCM team was consulted for assistance with chronic disease management and care coordination needs.    Review of patient status, including review of consultants reports, relevant laboratory and other test results, and collaboration with appropriate care team members and the patient's provider was performed as part of comprehensive patient evaluation and provision of chronic care management services.    SDOH (Social Determinants of Health) assessments performed: Yes See Care Plan activities for detailed interventions related to [facility])     Outpatient Encounter Medications as of 03/11/2020  Medication Sig   acetaminophen (TYLENOL) 325 MG tablet Take 650 mg by mouth every 4 (four) hours as needed.   allopurinol (ZYLOPRIM) 100 MG tablet Take 1 tablet (100 mg total) by mouth daily.   amiodarone (PACERONE) 200 MG tablet Take 200 mg by mouth daily.   apixaban (ELIQUIS) 5 MG TABS tablet Take 1 tablet (5 mg total) by mouth 2 (two) times daily.   ascorbic acid (VITAMIN C) 500 MG tablet Take 1 tablet (500 mg total) by mouth daily.   furosemide (LASIX) 40 MG tablet Take 40 mg by mouth daily.   metoprolol succinate (TOPROL-XL) 25 MG 24 hr tablet Take 25 mg by mouth daily.   potassium chloride (KLOR-CON) 10 MEQ tablet Take 10 mEq by mouth 2 (two) times daily.   simvastatin (ZOCOR) 20 MG tablet Take 1 tablet (20 mg total) by mouth daily at 6 PM.   zinc sulfate 220 (50 Zn) MG capsule Take 1 capsule (220 mg total) by mouth daily.   No facility-administered encounter medications on file as of 03/11/2020.      Objective:  BP Readings from Last 3 Encounters:  03/03/20 (!) 145/76  02/15/20 (!) 95/51  11/20/19 130/70    Goals Addressed              This Visit's Progress     COMPLETED: RNCM- Pt's daughter in law: "I am sure he has COVID" "His breathing is getting worse" (pt-stated)        CARE PLAN ENTRY (see longtitudinal plan of care for additional care plan information)  Current Barriers: Goal being closed. The patient did go to the hospital and was admitted for COVID pneumonia.  The patient is back home and doing well.  No further complications from COVID.   Knowledge Deficits related to COVID-19 and impact on patient self health management  Son and daughter in law have tested positive for COVID 19 and the patient lives in the home with them.   Decline in the patients health over the last several days. Patient per the daughter in law [name] has declined going to the ER  Daughter in law [name] verbalized the patient is "getting sicker and his breathing is worse"  Patient refusing for family to call 911  Clinical Goal(s):   Over the next 30 days, patient will verbalize basic understanding of COVID-19 impact on individual health and self health management as evidenced by verbalization of basic understanding of COVID-19 as a viral disease, measures to prevent exposure, signs and symptoms, when to contact provider  Over the next 4  hours the patient will be evaluated and treated in the ER for worsening sx/sx of suspected COVID19  Interventions:  Evaluation of current sx/sx.  Per [name] the daughter in law the patient has been getting worse by the day and pleas for him to go to the ER have not been successful. The son and daughter in law both have tested positive for COVID19 and suspect the patient has COVID 19 also.  The daughter in law says he is having increased shortness of breath. Educated on the importance of the patient being evaluated immediately for worsening sx/sx expressed. The  daughter in law states that as soon as she takes her grandkids home she will take the patient to the ER. Explained with urgency this is not something that could wait and the patient needed to go to the hospital by 911 services. Spoke directly to the patient and advised the patient the concern of his worsening sx/sx and the need to be evaluated. The patient states he is not going to ride the ambulance to the hospital. Agreed to let the daughter in law take him to the hospital.    Advised patient  to the call 911 and be evaluated in the ER immediately due to decline in breathing and worsening condition.   Provided education to patient re: urgency of evaluation and treatment due to worsening sx/sx and how serious the COVID 19 virus is on the elderly population and with his chronic health conditions it puts him at higher risk of complications including death if untreated. The daughter in law [name] was going to take the patient to the hospital and ended the call. Will continue to monitor for changes and assistance as needed.   Patient Self Care Activities:   Patient verbalizes understanding of plan to be evaluated for worsening sx/sx related to possible COVID19.  Calls provider office for new concerns or questions  Call 911 or go directly to the ER for evaluation and treatment  Please see past updates related to this goal by clicking on the "Past Updates" button in the selected goal                      RNCM: Pt-"He does not have a way of checking his blood pressure, we still can not afford 2 of his medications" (pt-stated)        CARE PLAN ENTRY (see longtitudinal plan of care for additional care plan information)  Current Barriers:   Chronic Disease Management support, education, and care coordination needs related to Atrial Fibrillation, HTN, and HLD  Financial Constraints  Clinical Goal(s) related to Atrial Fibrillation, HTN, and HLD:  Over the next 90 days, patient will:    Work with the care management team to address educational, disease management, and care coordination needs   Begin or continue self health monitoring activities as directed today Measure and record blood pressure 4 times per week and take medications as prescribed   Call provider office for new or worsened signs and symptoms Blood pressure findings outside established parameters and New or worsened symptom related to HLD and AFIB  Call care management team with questions or concerns  Verbalize basic understanding of patient centered plan of care established today  Over the next 90 days, Daughter in law will call [facility] and talk to customer service about the over the counter benefit and life alert system for the patient to get blood pressure cuff and system to help with safety  Interventions related to Atrial  Fibrillation, HTN, and HLD:   Evaluation of current treatment plans and patient's adherence to plan as established by provider- per the daughter in law the patient is still not taking the Metoprolol and Zocor due to affordability One is >$100 and the other is $47.00. 03-11-2020: The patients DIL states she went to pick up his Eliquis and they told her it would cost >300.00. They had filled the script for 90 days instead of 30. It was 47.00 with just a 30 day supply. The DIL will talk to the pharmacist next week about other options.   Assessed patient understanding of disease states- daughter in law helps the patient manage his care.  03-11-2020: The patient's DIL states that the patient is doing well since getting out of the hospital. He has had no further issues related to COVID19.  The patient was taking a nap at the time of the call.  Assessed patient's education and care coordination needs- denies the need for social work at this time but would like to talk to the pharmacist to assist with cost and resources to help pay for medications. 03-11-2020: Currently working with the pharmacist to get  medications at an affordable price.   Provided disease specific education to patient: Heart Healthy diet options, taking medications as prescribed, education and support on checking and recording blood pressure daily for optimal management of HTN.  03-11-2020: The patient per the daughter is eating well and adhering to dietary restrictions. The patient is doing well and no new complaints at this time.   Collaborated with appropriate clinical care team members regarding patient needs. 03-11-2020: Currently working with the pharm D. Denies any needs from the LCSW but knows they are available as needed.   Educated the daughter in Social worker to call  [facility] customer service number on the back of the patients insurance card and request the OTC catalog to be sent and information on obtaining the [employer] system free of charge  Sent EMAIL to [email or web address] so the daughter in law can sign up for myChart and see lab work, communicate with the healthcare team and educational information- 02-10-2020: Has not signed up yet. Will discuss further as today's call was an urgent call for decline in condition.   Patient Self Care Activities related to Atrial Fibrillation, HTN, and HLD:   Patient is unable to independently self-manage chronic health conditions  Please see past updates related to this goal by clicking on the "Past Updates" button in the selected goal          Plan:   Telephone follow up appointment with care management team member scheduled for: 05-13-2020 at 1 pm   [name] RN, MSN, CCM Community Care Coordinator [place]   [facility] Mobile: [phone]

## 2020-03-16 ENCOUNTER — Telehealth: Payer: Medicare Other | Admitting: Pharmacist

## 2020-05-03 ENCOUNTER — Other Ambulatory Visit: Payer: Self-pay | Admitting: Family Medicine

## 2020-05-03 ENCOUNTER — Other Ambulatory Visit: Payer: Self-pay | Admitting: Nurse Practitioner

## 2020-05-03 MED ORDER — METOPROLOL SUCCINATE ER 25 MG PO TB24: 25.0000 mg | ORAL_TABLET | Freq: Every day | ORAL | 4 refills | Status: DC

## 2020-05-03 NOTE — Telephone Encounter (Signed)
Previous RL patient, last seen 03/03/20.

## 2020-05-03 NOTE — Telephone Encounter (Signed)
I have sent in Metoprolol, but patient sees cardiology (Dr. Cassie Freer) who has filled Amiodarone in past -- would recommend they reach out to them to fill this.

## 2020-05-03 NOTE — Telephone Encounter (Signed)
Requested medication (s) are due for refill today: yes  Requested medication (s) are on the active medication list: yes  Last refill: 03/03/2020  Future visit scheduled: no  Notes to clinic: last filled by historical provider Review for refills    Requested Prescriptions  Pending Prescriptions Disp Refills   metoprolol succinate (TOPROL-XL) 25 MG 24 hr tablet [Pharmacy Med Name: METOPROLOL SUCC ER 25 MG TAB] 90 tablet     Sig: TAKE 1 TABLET BY MOUTH EVERY DAY      Cardiovascular:  Beta Blockers Failed - 05/03/2020  1:13 AM      Failed - Last BP in normal range    BP Readings from Last 1 Encounters:  03/03/20 (!) 145/76          Passed - Last Heart Rate in normal range    Pulse Readings from Last 1 Encounters:  03/03/20 (!) 51          Passed - Valid encounter within last 6 months    Recent Outpatient Visits           2 months ago Atrial fibrillation with rapid ventricular response (HCC)   [facility] [place], [place]ley Hews, [place]   2 months ago Erroneous encounter - disregard   [facility] Particia Nearing, [place]   2 months ago Erroneous encounter - disregard   [facility] Particia Nearing, [place]   5 months ago Mixed hyperlipidemia   [facility] Roosvelt Maser [place], [place]   7 months ago Mixed hyperlipidemia   [facility] Maurice March, [place], [place]                amiodarone (PACERONE) 200 MG tablet [Pharmacy Med Name: AMIODARONE HCL 200 MG TABLET] 90 tablet     Sig: TAKE 1 TABLET BY MOUTH EVERY DAY      Not Delegated - Cardiovascular: Antiarrhythmic Agents - amiodarone Failed - 05/03/2020  1:13 AM      Failed - This refill cannot be delegated      Failed - TSH in normal range and within 180 days    TSH  Date Value Ref Range Status  12/09/2018 1.608 0.350 - 4.500 uIU/mL Final    Comment:    Performed by a 3rd Generation assay with a functional sensitivity of <=0.01  uIU/mL. Performed at [facility], [address] [place], [place] [address]           Failed - Patient had ECG in the last 180 days.      Failed - Last BP in normal range    BP Readings from Last 1 Encounters:  03/03/20 (!) 145/76          Passed - Mg Level in normal range and within 180 days    Magnesium  Date Value Ref Range Status  02/15/2020 2.2 1.7 - 2.4 mg/dL Final    Comment:    Performed at [facility], [address] [place], [place] [address]          Passed - K in normal range and within 180 days    Potassium  Date Value Ref Range Status  03/09/2020 3.8 3.5 - 5.2 mmol/L Final          Passed - Ca in normal range and within 180 days    Calcium  Date Value Ref Range Status  03/09/2020 8.9 8.6 - 10.2 mg/dL Final          Passed -  AST in normal range and within 180 days    AST  Date Value Ref Range Status  03/03/2020 19 0 - 40 IU/L Final          Passed - ALT in normal range and within 180 days    ALT  Date Value Ref Range Status  03/03/2020 19 0 - 44 IU/L Final          Passed - Last Heart Rate in normal range    Pulse Readings from Last 1 Encounters:  03/03/20 (!) 51          Passed - Valid encounter within last 6 months    Recent Outpatient Visits           2 months ago Atrial fibrillation with rapid ventricular response [facility])   [facility] Particia Nearing, [place]   2 months ago Erroneous encounter - disregard   [facility] Particia Nearing, [place]   2 months ago Erroneous encounter - disregard   [facility] Particia Nearing, [place]   5 months ago Mixed hyperlipidemia   [facility] Roosvelt Maser [place], [place]   7 months ago Mixed hyperlipidemia   [facility] Roosvelt Maser [place], [place]

## 2020-05-13 ENCOUNTER — Telehealth: Payer: Self-pay | Admitting: General Practice

## 2020-05-13 ENCOUNTER — Telehealth: Payer: Self-pay

## 2020-05-13 NOTE — Telephone Encounter (Signed)
°  Chronic Care Management   Outreach Note  05/13/2020 Name: Caleb Fleming. MRN: [id number] DOB: 07-Jun-1932  Referred by: Particia Nearing, PA-C Reason for referral : Chronic Care Management (RNCM: Follow up Call for Chronic Disease Management and Care Coordination Needs)   An unsuccessful telephone outreach was attempted today. The patient was referred to the case management team for assistance with care management and care coordination.   Follow Up Plan: A HIPAA compliant phone message was left for the patient providing contact information and requesting a return call.   [name] RN, MSN, CCM Community Care Coordinator [place]   [facility] Mobile: [phone]

## 2020-05-18 ENCOUNTER — Ambulatory Visit (INDEPENDENT_AMBULATORY_CARE_PROVIDER_SITE_OTHER): Payer: Medicare Other | Admitting: Nurse Practitioner

## 2020-05-18 ENCOUNTER — Other Ambulatory Visit: Payer: Self-pay

## 2020-05-18 ENCOUNTER — Encounter: Payer: Self-pay | Admitting: Nurse Practitioner

## 2020-05-18 VITALS — BP 128/81 | HR 61 | Temp 98.0°F | Wt 158.2 lb

## 2020-05-18 DIAGNOSIS — I480 Paroxysmal atrial fibrillation: Secondary | ICD-10-CM | POA: Diagnosis not present

## 2020-05-18 DIAGNOSIS — I5020 Unspecified systolic (congestive) heart failure: Secondary | ICD-10-CM | POA: Diagnosis not present

## 2020-05-18 DIAGNOSIS — E782 Mixed hyperlipidemia: Secondary | ICD-10-CM | POA: Diagnosis not present

## 2020-05-18 DIAGNOSIS — N183 Chronic kidney disease, stage 3 unspecified: Secondary | ICD-10-CM | POA: Insufficient documentation

## 2020-05-18 DIAGNOSIS — D692 Other nonthrombocytopenic purpura: Secondary | ICD-10-CM

## 2020-05-18 DIAGNOSIS — N1832 Chronic kidney disease, stage 3b: Secondary | ICD-10-CM | POA: Diagnosis not present

## 2020-05-18 DIAGNOSIS — I10 Essential (primary) hypertension: Secondary | ICD-10-CM

## 2020-05-18 MED ORDER — ALLOPURINOL 100 MG PO TABS: 100.0000 mg | ORAL_TABLET | Freq: Every day | ORAL | 4 refills | Status: DC

## 2020-05-18 MED ORDER — ASCORBIC ACID 500 MG PO TABS: 500.0000 mg | ORAL_TABLET | Freq: Every day | ORAL | 4 refills | Status: DC

## 2020-05-18 MED ORDER — AMIODARONE HCL 200 MG PO TABS: 200.0000 mg | ORAL_TABLET | Freq: Every day | ORAL | 4 refills | Status: DC

## 2020-05-18 MED ORDER — SIMVASTATIN 20 MG PO TABS: 20.0000 mg | ORAL_TABLET | Freq: Every day | ORAL | 4 refills | Status: DC

## 2020-05-18 MED ORDER — ZINC SULFATE 220 (50 ZN) MG PO CAPS: 220.0000 mg | ORAL_CAPSULE | Freq: Every day | ORAL | 4 refills | Status: DC

## 2020-05-18 NOTE — Assessment & Plan Note (Addendum)
Noted on 12/08/2018 with EF 35-40%.  No repeat echo noted.  Euvolemic at this time.  He was to follow-up with cardiology in September 2020, have highly recommend he do this.  At this time continue current medication regimen and adjust as needed.  May benefit from repeat echo next visit, if he has not returned to cardiology as instructed.  BMP today.  Recommend: - Reminded to call for an overnight weight gain of >2 pounds or a weekly weight weight of >5 pounds - not adding salt to his food and has been reading food labels. Reviewed the importance of keeping daily sodium intake to 2000mg  daily

## 2020-05-18 NOTE — Assessment & Plan Note (Signed)
Chronic, ongoing with BP at goal today.  Continue current medication regimen and adjust as needed.  Recommend he monitor BP at home daily and document for providers + focus on DASH diet.  Minimize alcohol use.  Check BMP and TSH today.  Refills sent in.  Return in 6 months.

## 2020-05-18 NOTE — Progress Notes (Signed)
BP 128/81 (BP Location: Left Arm)   Pulse 61   Temp 98 F (36.7 C)   Wt 158 lb 3.2 oz (71.8 kg)   SpO2 97%   BMI 24.78 kg/m    Subjective:    Patient ID: Caleb Fleming., male    DOB: 11/27/1931, 86 y.o.   MRN: [id number]  HPI: Lacy Taglieri is a 86 y.o. male  Chief Complaint  Patient presents with  . Hypertension    6 month follow up   . Medication Refill   HYPERTENSION / HYPERLIPIDEMIA/HF Continues on Metoprolol, Lasix, and K+ -- last level 3.8 and NA+ 145.  Taking Simvastatin for HLD.  Has history of Covid 12/08/2018, which he reports was from the vaccine -- this is when a-fib noticed.  Was seeing cardiology, but has not seen since 01/05/2019, EF at that time 35 to 40%. Was to return to cardiology in 3 months. Satisfied with current treatment? yes Duration of hypertension: chronic BP monitoring frequency: not checking BP range:  BP medication side effects: no Duration of hyperlipidemia: chronic Cholesterol medication side effects: no Cholesterol supplements: none Medication compliance: good compliance Aspirin: no Recent stressors: no Recurrent headaches: no Visual changes: no Palpitations: no Dyspnea: no Chest pain: no Lower extremity edema: no Dizzy/lightheaded: no   CHRONIC KIDNEY DISEASE Noted on labs, last check CRT 1.81 and GFR 33. Has not seen kidney doctor and is not interested in this at this time. CKD status: stable Medications renally dose: yes Previous renal evaluation: no Pneumovax:  Up to Date Influenza Vaccine:  Up To Date he reports  ATRIAL FIBRILLATION Continues on Amiodarone and Metoprolol + Apixaban. Atrial fibrillation status: stable Satisfied with current treatment: yes  Medication side effects:  no Medication compliance: good compliance Etiology of atrial fibrillation:  Palpitations:  no Chest pain:  no Dyspnea on exertion:  no Orthopnea:  no Syncope:  no Edema:  no Ventricular rate control:  B-blocker Anti-coagulation: long acting  Relevant past medical, surgical, family and social history reviewed and updated as indicated. Interim medical history since our last visit reviewed. Allergies and medications reviewed and updated.  Review of Systems  Constitutional: Negative for activity change, diaphoresis, fatigue and fever.  Respiratory: Negative for cough, chest tightness, shortness of breath and wheezing.   Cardiovascular: Negative for chest pain, palpitations and leg swelling.  Gastrointestinal: Negative.   Musculoskeletal: Negative.   Skin: Negative.   Neurological: Negative.   Psychiatric/Behavioral: Negative.     Per HPI unless specifically indicated above     Objective:    BP 128/81 (BP Location: Left Arm)   Pulse 61   Temp 98 F (36.7 C)   Wt 158 lb 3.2 oz (71.8 kg)   SpO2 97%   BMI 24.78 kg/m   Wt Readings from Last 3 Encounters:  05/18/20 158 lb 3.2 oz (71.8 kg)  03/03/20 153 lb (69.4 kg)  02/22/20 160 lb (72.6 kg)    Physical Exam Vitals and nursing note reviewed.  Constitutional:      General: He is awake. He is not in acute distress.    Appearance: He is well-developed and well-groomed. He is not ill-appearing.  HENT:     Head: Normocephalic and atraumatic.     Right Ear: Hearing normal. No drainage.     Left Ear: Hearing normal. No drainage.  Eyes:     General: Lids are normal.        Right eye: No discharge.  Left eye: No discharge.     Conjunctiva/sclera: Conjunctivae normal.     Pupils: Pupils are equal, round, and reactive to light.  Neck:     Thyroid: No thyromegaly.     Vascular: No carotid bruit.     Trachea: Trachea normal.  Cardiovascular:     Rate and Rhythm: Normal rate and regular rhythm.     Heart sounds: Normal heart sounds, S1 normal and S2 normal. No murmur heard.  No gallop.   Pulmonary:     Effort: Pulmonary effort is normal. No accessory muscle usage or respiratory distress.     Breath sounds: Normal breath  sounds.  Abdominal:     General: Bowel sounds are normal.     Palpations: Abdomen is soft.  Musculoskeletal:        General: Normal range of motion.     Cervical back: Normal range of motion and neck supple.     Right lower leg: No edema.     Left lower leg: No edema.  Skin:    General: Skin is warm and dry.     Capillary Refill: Capillary refill takes less than 2 seconds.     Findings: No rash.     Comments: Scattered pale purple bruises noted to BUE + tattoo to left forearm  Neurological:     Mental Status: He is alert and oriented to person, place, and time.     Deep Tendon Reflexes: Reflexes are normal and symmetric.  Psychiatric:        Attention and Perception: Attention normal.        Mood and Affect: Mood normal.        Speech: Speech normal.        Behavior: Behavior normal. Behavior is cooperative.        Thought Content: Thought content normal.    Results for orders placed or performed in visit on 03/09/20  Basic metabolic panel  Result Value Ref Range   Glucose 96 65 - 99 mg/dL   BUN 25 8 - 27 mg/dL   Creatinine, Ser 3.29 (H) 0.76 - 1.27 mg/dL   GFR calc non Af Amer 33 (L) >59 mL/min/1.73   GFR calc Af Amer 38 (L) >59 mL/min/1.73   BUN/Creatinine Ratio 14 10 - 24   Sodium 145 (H) 134 - 144 mmol/L   Potassium 3.8 3.5 - 5.2 mmol/L   Chloride 102 96 - 106 mmol/L   CO2 26 20 - 29 mmol/L   Calcium 8.9 8.6 - 10.2 mg/dL      Assessment & Plan:   Problem List Items Addressed This Visit      Cardiovascular and Mediastinum   Hypertension    Chronic, ongoing with BP at goal today.  Continue current medication regimen and adjust as needed.  Recommend he monitor BP at home daily and document for providers + focus on DASH diet.  Minimize alcohol use.  Check BMP and TSH today.  Refills sent in.  Return in 6 months.      Relevant Medications   amiodarone (PACERONE) 200 MG tablet   simvastatin (ZOCOR) 20 MG tablet   Other Relevant Orders   Basic metabolic panel   TSH    AF (paroxysmal atrial fibrillation) (HCC) - Primary    Chronic, ongoing with rate-controlled at this time.  Continue current medication regimen and adjust as needed.  Recommended he return to cardiology for follow-up, as they had requested.  Will send in refills today and obtain labs.  Return  in 6 months.      Relevant Medications   amiodarone (PACERONE) 200 MG tablet   simvastatin (ZOCOR) 20 MG tablet   Other Relevant Orders   CBC with Differential/Platelet   HFrEF (heart failure with reduced ejection fraction) (HCC)    Noted on 12/08/2018 with EF 35-40%.  No repeat echo noted.  Euvolemic at this time.  He was to follow-up with cardiology in September 2020, have highly recommend he do this.  At this time continue current medication regimen and adjust as needed.  May benefit from repeat echo next visit, if he has not returned to cardiology as instructed.  BMP today.  Recommend: - Reminded to call for an overnight weight gain of >2 pounds or a weekly weight weight of >5 pounds - not adding salt to his food and has been reading food labels. Reviewed the importance of keeping daily sodium intake to 2000mg  daily       Relevant Medications   amiodarone (PACERONE) 200 MG tablet   simvastatin (ZOCOR) 20 MG tablet   Senile purpura (HCC)    As evidenced by scattered upper body bruising, is on long term anticoagulant.  Recommend he perform gentle skin care at home and monitor closely for breakdown, if present alert provider.      Relevant Medications   amiodarone (PACERONE) 200 MG tablet   simvastatin (ZOCOR) 20 MG tablet     Genitourinary   CKD (chronic kidney disease), stage III (HCC)    Chronic, ongoing.  Continue current medication regimen and adjust as needed.  Would benefit from addition of ACE or ARB in future if low risk for hypotension based on BP readings.  Recommend he monitor BP at home daily and bring to next visit.  Recommend a referral to nephrology, which he declines at this time.   BMP today.  Return in 6 months.        Other   Hyperlipidemia    Chronic, ongoing.  Continue current medication regimen and adjust as needed.  Refills sent in.  Lipid panel today.         Relevant Medications   amiodarone (PACERONE) 200 MG tablet   simvastatin (ZOCOR) 20 MG tablet   Other Relevant Orders   Lipid Panel w/o Chol/HDL Ratio       Follow up plan: Return in about 6 months (around 11/15/2020) for HTN/HLD, A-Fib, CKD --- to meet new PCP.

## 2020-05-18 NOTE — Assessment & Plan Note (Signed)
Chronic, ongoing.  Continue current medication regimen and adjust as needed.  Would benefit from addition of ACE or ARB in future if low risk for hypotension based on BP readings.  Recommend he monitor BP at home daily and bring to next visit.  Recommend a referral to nephrology, which he declines at this time.  BMP today.  Return in 6 months.

## 2020-05-18 NOTE — Assessment & Plan Note (Signed)
Chronic, ongoing with rate-controlled at this time.  Continue current medication regimen and adjust as needed.  Recommended he return to cardiology for follow-up, as they had requested.  Will send in refills today and obtain labs.  Return in 6 months.

## 2020-05-18 NOTE — Assessment & Plan Note (Signed)
As evidenced by scattered upper body bruising, is on long term anticoagulant.  Recommend he perform gentle skin care at home and monitor closely for breakdown, if present alert provider.

## 2020-05-18 NOTE — Patient Instructions (Signed)
DASH Eating Plan DASH stands for "Dietary Approaches to Stop Hypertension." The DASH eating plan is a healthy eating plan that has been shown to reduce high blood pressure (hypertension). It may also reduce your risk for type 2 diabetes, heart disease, and stroke. The DASH eating plan may also help with weight loss. What are tips for following this plan?  General guidelines  Avoid eating more than 2,300 mg (milligrams) of salt (sodium) a day. If you have hypertension, you may need to reduce your sodium intake to 1,500 mg a day.  Limit alcohol intake to no more than 1 drink a day for nonpregnant women and 2 drinks a day for men. One drink equals 12 oz of beer, 5 oz of wine, or 1 oz of hard liquor.  Work with your health care provider to maintain a healthy body weight or to lose weight. Ask what an ideal weight is for you.  Get at least 30 minutes of exercise that causes your heart to beat faster (aerobic exercise) most days of the week. Activities may include walking, swimming, or biking.  Work with your health care provider or diet and nutrition specialist (dietitian) to adjust your eating plan to your individual calorie needs. Reading food labels   Check food labels for the amount of sodium per serving. Choose foods with less than 5 percent of the Daily Value of sodium. Generally, foods with less than 300 mg of sodium per serving fit into this eating plan.  To find whole grains, look for the word "whole" as the first word in the ingredient list. Shopping  Buy products labeled as "low-sodium" or "no salt added."  Buy fresh foods. Avoid canned foods and premade or frozen meals. Cooking  Avoid adding salt when cooking. Use salt-free seasonings or herbs instead of table salt or sea salt. Check with your health care provider or pharmacist before using salt substitutes.  Do not fry foods. Cook foods using healthy methods such as baking, boiling, grilling, and broiling instead.  Cook with  heart-healthy oils, such as olive, canola, soybean, or sunflower oil. Meal planning  Eat a balanced diet that includes: ? 5 or more servings of fruits and vegetables each day. At each meal, try to fill half of your plate with fruits and vegetables. ? Up to 6-8 servings of whole grains each day. ? Less than 6 oz of lean meat, poultry, or fish each day. A 3-oz serving of meat is about the same size as a deck of cards. One egg equals 1 oz. ? 2 servings of low-fat dairy each day. ? A serving of nuts, seeds, or beans 5 times each week. ? Heart-healthy fats. Healthy fats called Omega-3 fatty acids are found in foods such as flaxseeds and coldwater fish, like sardines, salmon, and mackerel.  Limit how much you eat of the following: ? Canned or prepackaged foods. ? Food that is high in trans fat, such as fried foods. ? Food that is high in saturated fat, such as fatty meat. ? Sweets, desserts, sugary drinks, and other foods with added sugar. ? Full-fat dairy products.  Do not salt foods before eating.  Try to eat at least 2 vegetarian meals each week.  Eat more home-cooked food and less restaurant, buffet, and fast food.  When eating at a restaurant, ask that your food be prepared with less salt or no salt, if possible. What foods are recommended? The items listed may not be a complete list. Talk with your dietitian about   what dietary choices are best for you. Grains Whole-grain or whole-wheat bread. Whole-grain or whole-wheat pasta. Brown rice. Oatmeal. Quinoa. Bulgur. Whole-grain and low-sodium cereals. Pita bread. Low-fat, low-sodium crackers. Whole-wheat flour tortillas. Vegetables Fresh or frozen vegetables (raw, steamed, roasted, or grilled). Low-sodium or reduced-sodium tomato and vegetable juice. Low-sodium or reduced-sodium tomato sauce and tomato paste. Low-sodium or reduced-sodium canned vegetables. Fruits All fresh, dried, or frozen fruit. Canned fruit in natural juice (without  added sugar). Meat and other protein foods Skinless chicken or turkey. Ground chicken or turkey. Pork with fat trimmed off. Fish and seafood. Egg whites. Dried beans, peas, or lentils. Unsalted nuts, nut butters, and seeds. Unsalted canned beans. Lean cuts of beef with fat trimmed off. Low-sodium, lean deli meat. Dairy Low-fat (1%) or fat-free (skim) milk. Fat-free, low-fat, or reduced-fat cheeses. Nonfat, low-sodium ricotta or cottage cheese. Low-fat or nonfat yogurt. Low-fat, low-sodium cheese. Fats and oils Soft margarine without trans fats. Vegetable oil. Low-fat, reduced-fat, or light mayonnaise and salad dressings (reduced-sodium). Canola, safflower, olive, soybean, and sunflower oils. Avocado. Seasoning and other foods Herbs. Spices. Seasoning mixes without salt. Unsalted popcorn and pretzels. Fat-free sweets. What foods are not recommended? The items listed may not be a complete list. Talk with your dietitian about what dietary choices are best for you. Grains Baked goods made with fat, such as croissants, muffins, or some breads. Dry pasta or rice meal packs. Vegetables Creamed or fried vegetables. Vegetables in a cheese sauce. Regular canned vegetables (not low-sodium or reduced-sodium). Regular canned tomato sauce and paste (not low-sodium or reduced-sodium). Regular tomato and vegetable juice (not low-sodium or reduced-sodium). Pickles. Olives. Fruits Canned fruit in a light or heavy syrup. Fried fruit. Fruit in cream or butter sauce. Meat and other protein foods Fatty cuts of meat. Ribs. Fried meat. Bacon. Sausage. Bologna and other processed lunch meats. Salami. Fatback. Hotdogs. Bratwurst. Salted nuts and seeds. Canned beans with added salt. Canned or smoked fish. Whole eggs or egg yolks. Chicken or turkey with skin. Dairy Whole or 2% milk, cream, and half-and-half. Whole or full-fat cream cheese. Whole-fat or sweetened yogurt. Full-fat cheese. Nondairy creamers. Whipped toppings.  Processed cheese and cheese spreads. Fats and oils Butter. Stick margarine. Lard. Shortening. Ghee. Bacon fat. Tropical oils, such as coconut, palm kernel, or palm oil. Seasoning and other foods Salted popcorn and pretzels. Onion salt, garlic salt, seasoned salt, table salt, and sea salt. Worcestershire sauce. Tartar sauce. Barbecue sauce. Teriyaki sauce. Soy sauce, including reduced-sodium. Steak sauce. Canned and packaged gravies. Fish sauce. Oyster sauce. Cocktail sauce. Horseradish that you find on the shelf. Ketchup. Mustard. Meat flavorings and tenderizers. Bouillon cubes. Hot sauce and Tabasco sauce. Premade or packaged marinades. Premade or packaged taco seasonings. Relishes. Regular salad dressings. Where to find more information:  National Heart, Lung, and Blood Institute: www.nhlbi.nih.gov  American Heart Association: www.heart.org Summary  The DASH eating plan is a healthy eating plan that has been shown to reduce high blood pressure (hypertension). It may also reduce your risk for type 2 diabetes, heart disease, and stroke.  With the DASH eating plan, you should limit salt (sodium) intake to 2,300 mg a day. If you have hypertension, you may need to reduce your sodium intake to 1,500 mg a day.  When on the DASH eating plan, aim to eat more fresh fruits and vegetables, whole grains, lean proteins, low-fat dairy, and heart-healthy fats.  Work with your health care provider or diet and nutrition specialist (dietitian) to adjust your eating plan to your   individual calorie needs. This information is not intended to replace advice given to you by your health care provider. Make sure you discuss any questions you have with your health care provider. Document Revised: 06/07/2017 Document Reviewed: 06/18/2016 Elsevier Patient Education  2020 [employer]  

## 2020-05-18 NOTE — Assessment & Plan Note (Signed)
Chronic, ongoing.  Continue current medication regimen and adjust as needed.  Refills sent in.  Lipid panel today.    

## 2020-05-19 LAB — CBC WITH DIFFERENTIAL/PLATELET
Basophils Absolute: 0.1 10*3/uL (ref 0.0–0.2)
Basos: 1 %
EOS (ABSOLUTE): 0.5 10*3/uL — ABNORMAL HIGH (ref 0.0–0.4)
Eos: 5 %
Hematocrit: 40.5 % (ref 37.5–51.0)
Hemoglobin: 13.8 g/dL (ref 13.0–17.7)
Immature Grans (Abs): 0 10*3/uL (ref 0.0–0.1)
Immature Granulocytes: 0 %
Lymphocytes Absolute: 1.3 10*3/uL (ref 0.7–3.1)
Lymphs: 14 %
MCH: 33.2 pg — ABNORMAL HIGH (ref 26.6–33.0)
MCHC: 34.1 g/dL (ref 31.5–35.7)
MCV: 97 fL (ref 79–97)
Monocytes Absolute: 1.1 10*3/uL — ABNORMAL HIGH (ref 0.1–0.9)
Monocytes: 12 %
Neutrophils Absolute: 6 10*3/uL (ref 1.4–7.0)
Neutrophils: 68 %
Platelets: 254 10*3/uL (ref 150–450)
RBC: 4.16 x10E6/uL (ref 4.14–5.80)
RDW: 12.1 % (ref 11.6–15.4)
WBC: 8.9 10*3/uL (ref 3.4–10.8)

## 2020-05-19 LAB — LIPID PANEL W/O CHOL/HDL RATIO
Cholesterol, Total: 154 mg/dL (ref 100–199)
HDL: 66 mg/dL (ref >39)
LDL Chol Calc (NIH): 73 mg/dL (ref 0–99)
Triglycerides: 79 mg/dL (ref 0–149)
VLDL Cholesterol Cal: 15 mg/dL (ref 5–40)

## 2020-05-19 LAB — BASIC METABOLIC PANEL
BUN/Creatinine Ratio: 13 (ref 10–24)
BUN: 20 mg/dL (ref 8–27)
CO2: 24 mmol/L (ref 20–29)
Calcium: 9.1 mg/dL (ref 8.6–10.2)
Chloride: 98 mmol/L (ref 96–106)
Creatinine, Ser: 1.56 mg/dL — ABNORMAL HIGH (ref 0.76–1.27)
GFR calc Af Amer: 46 mL/min/{1.73_m2} — ABNORMAL LOW (ref >59)
GFR calc non Af Amer: 40 mL/min/{1.73_m2} — ABNORMAL LOW (ref >59)
Glucose: 91 mg/dL (ref 65–99)
Potassium: 3.6 mmol/L (ref 3.5–5.2)
Sodium: 136 mmol/L (ref 134–144)

## 2020-05-19 LAB — TSH: TSH: 0.781 u[IU]/mL (ref 0.450–4.500)

## 2020-05-19 NOTE — Progress Notes (Signed)
Good morning, please let Caleb Fleming know his labs have returned and overall at staying at baseline.  Continues to show some stage 3 kidney disease we will continue to monitor at visits.  If any questions let me know.  Have a great day!!

## 2020-06-01 ENCOUNTER — Telehealth: Payer: Self-pay

## 2020-06-01 NOTE — Chronic Care Management (AMB) (Signed)
  Care Management   Note  06/01/2020 Name: Syaire Saber. MRN: [id number] DOB: 1931/11/08  Vincente Asbridge. is a 86 y.o. year old male who is a primary care patient of Particia Nearing, Cordelia Poche and is actively engaged with the care management team. I reached out to [name]. by phone today to assist with re-scheduling a follow up visit with the RN Case Manager  Follow up plan: Unsuccessful telephone outreach attempt made. A HIPAA compliant phone message was left for the patient providing contact information and requesting a return call.  The care management team will reach out to the patient again over the next 7 days.  If patient returns call to provider office, please advise to call Embedded Care Management Care Guide Penne Lash  at [phone]  Penne Lash, RMA Care Guide, Embedded Care Coordination [facility]  [place], [place] [address] Direct Dial: [phone] [email or web address]: [email or web address]

## 2020-06-16 NOTE — Telephone Encounter (Signed)
Pt has been r/s  

## 2020-06-16 NOTE — Chronic Care Management (AMB) (Signed)
  Care Management   Note  06/16/2020 Name: Caleb Fleming. MRN: [id number] DOB: 18-Feb-1932  [name]. is a 86 y.o. year old male who is a primary care patient of Particia Nearing, Cordelia Poche and is actively engaged with the care management team. I reached out to [name]. by phone today to assist with re-scheduling a follow up visit with the RN Case Manager  Follow up plan: Telephone appointment with care management team member scheduled for:08/13/2019  Penne Lash, RMA Care Guide, Embedded Care Coordination [facility]  [place], [place] [address] Direct Dial: [phone] [email or web address]

## 2020-07-17 ENCOUNTER — Other Ambulatory Visit: Payer: Self-pay

## 2020-07-17 ENCOUNTER — Emergency Department
Admission: EM | Admit: 2020-07-17 | Discharge: 2020-07-17 | Disposition: A | Discharge status: Home / Self Care | Payer: Medicare Other | Attending: Emergency Medicine | Admitting: Emergency Medicine

## 2020-07-17 ENCOUNTER — Emergency Department: Payer: Medicare Other

## 2020-07-17 DIAGNOSIS — I480 Paroxysmal atrial fibrillation: Secondary | ICD-10-CM | POA: Insufficient documentation

## 2020-07-17 DIAGNOSIS — M47812 Spondylosis without myelopathy or radiculopathy, cervical region: Secondary | ICD-10-CM | POA: Diagnosis not present

## 2020-07-17 DIAGNOSIS — Z79899 Other long term (current) drug therapy: Secondary | ICD-10-CM | POA: Diagnosis not present

## 2020-07-17 DIAGNOSIS — I5033 Acute on chronic diastolic (congestive) heart failure: Secondary | ICD-10-CM | POA: Insufficient documentation

## 2020-07-17 DIAGNOSIS — M4802 Spinal stenosis, cervical region: Secondary | ICD-10-CM | POA: Diagnosis not present

## 2020-07-17 DIAGNOSIS — Z23 Encounter for immunization: Secondary | ICD-10-CM | POA: Diagnosis not present

## 2020-07-17 DIAGNOSIS — Z8616 Personal history of COVID-19: Secondary | ICD-10-CM | POA: Diagnosis not present

## 2020-07-17 DIAGNOSIS — Z7901 Long term (current) use of anticoagulants: Secondary | ICD-10-CM | POA: Diagnosis not present

## 2020-07-17 DIAGNOSIS — W010XXA Fall on same level from slipping, tripping and stumbling without subsequent striking against object, initial encounter: Secondary | ICD-10-CM | POA: Insufficient documentation

## 2020-07-17 DIAGNOSIS — S0181XA Laceration without foreign body of other part of head, initial encounter: Secondary | ICD-10-CM | POA: Insufficient documentation

## 2020-07-17 DIAGNOSIS — S199XXA Unspecified injury of neck, initial encounter: Secondary | ICD-10-CM | POA: Diagnosis not present

## 2020-07-17 DIAGNOSIS — S0990XA Unspecified injury of head, initial encounter: Secondary | ICD-10-CM

## 2020-07-17 DIAGNOSIS — W19XXXA Unspecified fall, initial encounter: Secondary | ICD-10-CM

## 2020-07-17 DIAGNOSIS — N183 Chronic kidney disease, stage 3 unspecified: Secondary | ICD-10-CM | POA: Diagnosis not present

## 2020-07-17 DIAGNOSIS — Z87891 Personal history of nicotine dependence: Secondary | ICD-10-CM | POA: Insufficient documentation

## 2020-07-17 DIAGNOSIS — S0101XA Laceration without foreign body of scalp, initial encounter: Secondary | ICD-10-CM | POA: Diagnosis not present

## 2020-07-17 DIAGNOSIS — Y92007 Garden or yard of unspecified non-institutional (private) residence as the place of occurrence of the external cause: Secondary | ICD-10-CM | POA: Diagnosis not present

## 2020-07-17 DIAGNOSIS — I6529 Occlusion and stenosis of unspecified carotid artery: Secondary | ICD-10-CM | POA: Diagnosis not present

## 2020-07-17 DIAGNOSIS — I130 Hypertensive heart and chronic kidney disease with heart failure and stage 1 through stage 4 chronic kidney disease, or unspecified chronic kidney disease: Secondary | ICD-10-CM | POA: Insufficient documentation

## 2020-07-17 DIAGNOSIS — I672 Cerebral atherosclerosis: Secondary | ICD-10-CM | POA: Diagnosis not present

## 2020-07-17 LAB — BASIC METABOLIC PANEL
Anion gap: 12 (ref 5–15)
BUN: 23 mg/dL (ref 8–23)
CO2: 24 mmol/L (ref 22–32)
Calcium: 8.3 mg/dL — ABNORMAL LOW (ref 8.9–10.3)
Chloride: 100 mmol/L (ref 98–111)
Creatinine, Ser: 1.57 mg/dL — ABNORMAL HIGH (ref 0.61–1.24)
GFR, Estimated: 43 mL/min — ABNORMAL LOW (ref >60)
Glucose, Bld: 97 mg/dL (ref 70–99)
Potassium: 3.4 mmol/L — ABNORMAL LOW (ref 3.5–5.1)
Sodium: 136 mmol/L (ref 135–145)

## 2020-07-17 LAB — CBC WITH DIFFERENTIAL/PLATELET
Abs Immature Granulocytes: 0.1 10*3/uL — ABNORMAL HIGH (ref 0.00–0.07)
Basophils Absolute: 0.1 10*3/uL (ref 0.0–0.1)
Basophils Relative: 1 %
Eosinophils Absolute: 0.4 10*3/uL (ref 0.0–0.5)
Eosinophils Relative: 3 %
HCT: 36.1 % — ABNORMAL LOW (ref 39.0–52.0)
Hemoglobin: 12 g/dL — ABNORMAL LOW (ref 13.0–17.0)
Immature Granulocytes: 1 %
Lymphocytes Relative: 16 %
Lymphs Abs: 2.1 10*3/uL (ref 0.7–4.0)
MCH: 31 pg (ref 26.0–34.0)
MCHC: 33.2 g/dL (ref 30.0–36.0)
MCV: 93.3 fL (ref 80.0–100.0)
Monocytes Absolute: 1.4 10*3/uL — ABNORMAL HIGH (ref 0.1–1.0)
Monocytes Relative: 11 %
Neutro Abs: 8.8 10*3/uL — ABNORMAL HIGH (ref 1.7–7.7)
Neutrophils Relative %: 68 %
Platelets: 274 10*3/uL (ref 150–400)
RBC: 3.87 MIL/uL — ABNORMAL LOW (ref 4.22–5.81)
RDW: 13.3 % (ref 11.5–15.5)
WBC: 12.9 10*3/uL — ABNORMAL HIGH (ref 4.0–10.5)
nRBC: 0 % (ref 0.0–0.2)

## 2020-07-17 MED ORDER — CEPHALEXIN 250 MG PO CAPS
250.0000 mg | ORAL_CAPSULE | Freq: Three times a day (TID) | ORAL | 0 refills | Status: DC
Start: 2020-07-17 — End: 2020-08-31

## 2020-07-17 MED ORDER — BACITRACIN ZINC 500 UNIT/GM EX OINT: TOPICAL_OINTMENT | Freq: Once | CUTANEOUS | Status: AC

## 2020-07-17 MED ORDER — TETANUS-DIPHTH-ACELL PERTUSSIS 5-2.5-18.5 LF-MCG/0.5 IM SUSY
0.5000 mL | PREFILLED_SYRINGE | Freq: Once | INTRAMUSCULAR | Status: AC
  Administered 2020-07-17: 0.5 mL via INTRAMUSCULAR
  Filled 2020-07-17: qty 0.5

## 2020-07-17 MED ORDER — CEFAZOLIN SODIUM-DEXTROSE 1-4 GM/50ML-% IV SOLN
1.0000 g | Freq: Once | INTRAVENOUS | Status: AC
  Administered 2020-07-17: 1 g via INTRAVENOUS
  Filled 2020-07-17: qty 50

## 2020-07-17 NOTE — ED Notes (Addendum)
EDP at bedside applying sutures at this time.

## 2020-07-17 NOTE — Discharge Instructions (Addendum)
1.  Suture removal in 5 days. 2.  Your tetanus has been updated and will be good for 10 years. 3.  Take antibiotic as prescribed (Keflex 250mg  3 times daily x7 days). 4.  Return to the ER for worsening symptoms, persistent vomiting, difficulty breathing or other concerns.

## 2020-07-17 NOTE — ED Notes (Addendum)
Pt transported to CT at this time.

## 2020-07-17 NOTE — ED Provider Notes (Signed)
[facility] Emergency Department Provider Note   ____________________________________________   Event Date/Time   First MD Initiated Contact with Patient 07/17/20 0111     (approximate)  I have reviewed the triage vital signs and the nursing notes.   HISTORY  Chief Complaint Fall    HPI Caleb Fleming. is a 87 y.o. male who presents to the ED from home status post mechanical fall with head wound.  Patient reports falling on some rocks in the yard approximately 6 PM.  Denies LOC.  Presents now several hours later due to uncontrolled bleeding.  Patient does take Eliquis for PAF.  Denies headache, vision change, neck pain, chest pain, shortness of breath, abdominal pain, nausea, vomiting or dizziness.  Tetanus is not up-to-date.     Past Medical History:  Diagnosis Date  . Anxiety   . BPH (benign prostatic hypertrophy)   . Depression   . ED (erectile dysfunction)   . Gout   . Hyperlipidemia   . Hypertension   . Insomnia     Patient Active Problem List   Diagnosis Date Noted  . HFrEF (heart failure with reduced ejection fraction) (HCC) 05/18/2020  . CKD (chronic kidney disease), stage III (HCC) 05/18/2020  . Senile purpura (HCC) 05/18/2020  . AF (paroxysmal atrial fibrillation) (HCC) 02/11/2020  . History of COVID-19 02/11/2020  . Alcohol abuse 12/25/2018  . Benign prostatic hyperplasia 05/26/2015  . Gout 05/26/2015  . Hyperlipidemia 05/26/2015  . Insomnia 05/26/2015  . Hypogonadism in male 05/26/2015  . Hypertension 05/26/2015  . Anxiety and depression 05/26/2015    No past surgical history on file.  Prior to Admission medications   Medication Sig Start Date End Date Taking? Authorizing Provider  cephALEXin (KEFLEX) 250 MG capsule Take 1 capsule (250 mg total) by mouth 3 (three) times daily. 07/17/20  Yes Irean Hong, MD  acetaminophen (TYLENOL) 325 MG tablet Take 650 mg by mouth every 4 (four) hours as needed. Patient not taking:  Reported on 05/18/2020 05/09/15   [provider]  allopurinol (ZYLOPRIM) 100 MG tablet Take 1 tablet (100 mg total) by mouth daily. 05/18/20   Cannady, Corrie Dandy T, NP  amiodarone (PACERONE) 200 MG tablet Take 1 tablet (200 mg total) by mouth daily. 05/18/20   Cannady, Corrie Dandy T, NP  apixaban (ELIQUIS) 5 MG TABS tablet Take 1 tablet (5 mg total) by mouth 2 (two) times daily. 09/23/19   Particia Nearing, PA-C  ascorbic acid (VITAMIN C) 500 MG tablet Take 1 tablet (500 mg total) by mouth daily. 05/18/20   Cannady, Corrie Dandy T, NP  furosemide (LASIX) 40 MG tablet Take 40 mg by mouth daily.    [provider]  metoprolol succinate (TOPROL-XL) 25 MG 24 hr tablet Take 1 tablet (25 mg total) by mouth daily. 05/03/20   Cannady, Corrie Dandy T, NP  potassium chloride (KLOR-CON) 10 MEQ tablet Take 10 mEq by mouth 2 (two) times daily. 02/15/20   [provider]  simvastatin (ZOCOR) 20 MG tablet Take 1 tablet (20 mg total) by mouth daily at 6 PM. 05/18/20   Cannady, Jolene T, NP  zinc sulfate 220 (50 Zn) MG capsule Take 1 capsule (220 mg total) by mouth daily. 05/18/20   Aura Dials T, NP    Allergies Bactrim [sulfamethoxazole-trimethoprim] and Codeine  Family History  Problem Relation Age of Onset  . Cancer Mother   . Cancer Father     Social History Social History   Tobacco Use  .  Smoking status: Former Games developer  . Smokeless tobacco: Current User    Types: Chew  Vaping Use  . Vaping Use: Never used  Substance Use Topics  . Alcohol use: Yes    Alcohol/week: 0.0 standard drinks    Comment: occasional  . Drug use: Never    Review of Systems  Constitutional: Positive for fall.  No fever/chills Eyes: No visual changes. ENT: No sore throat. Cardiovascular: Denies chest pain. Respiratory: Denies shortness of breath. Gastrointestinal: No abdominal pain.  No nausea, no vomiting.  No diarrhea.  No constipation. Genitourinary: Negative for dysuria. Musculoskeletal:  Negative for back pain. Skin: Positive for facial laceration.  Negative for rash. Neurological: Negative for headaches, focal weakness or numbness.   ____________________________________________   PHYSICAL EXAM:  VITAL SIGNS: ED Triage Vitals  Enc Vitals Group     BP 07/17/20 0123 (!) 171/79     Pulse Rate 07/17/20 0123 (!) 53     Resp 07/17/20 0123 16     Temp 07/17/20 0123 (!) 97.3 F (36.3 C)     Temp Source 07/17/20 0123 Oral     SpO2 07/17/20 0123 99 %     Weight --      Height --      Head Circumference --      Peak Flow --      Pain Score 07/17/20 0119 0     Pain Loc --      Pain Edu? --      Excl. in GC? --     Constitutional: Alert and oriented.  Elderly appearing and in no acute distress. Eyes: Conjunctivae are normal. PERRL. EOMI. Head: Approximately 5 cm mostly linear but slightly macerated deep laceration to right forehead with small arterial bleeding which is controlled with direct pressure. Nose: Atraumatic. Mouth/Throat: Mucous membranes are moist.  Edentulous.   Neck: No stridor.  No cervical spine tenderness to palpation. Cardiovascular: Normal rate, regular rhythm. Grossly normal heart sounds.  Good peripheral circulation. Respiratory: Normal respiratory effort.  No retractions. Lungs CTAB. Gastrointestinal: Soft and nontender. No distention. No abdominal bruits. No CVA tenderness. Musculoskeletal: No lower extremity tenderness nor edema.  No joint effusions. Neurologic: Oriented x3.  CN II XII grossly intact.  Normal speech and language. No gross focal neurologic deficits are appreciated. MAEx4. Skin:  Skin is warm, dry and intact. No rash noted. Psychiatric: Mood and affect are normal. Speech and behavior are normal.  ____________________________________________   LABS (all labs ordered are listed, but only abnormal results are displayed)  Labs Reviewed  CBC WITH DIFFERENTIAL/PLATELET - Abnormal; Notable for the following components:      Result  Value   WBC 12.9 (*)    RBC 3.87 (*)    Hemoglobin 12.0 (*)    HCT 36.1 (*)    Neutro Abs 8.8 (*)    Monocytes Absolute 1.4 (*)    Abs Immature Granulocytes 0.10 (*)    All other components within normal limits  BASIC METABOLIC PANEL - Abnormal; Notable for the following components:   Potassium 3.4 (*)    Creatinine, Ser 1.57 (*)    Calcium 8.3 (*)    GFR, Estimated 43 (*)    All other components within normal limits   ____________________________________________  EKG  None ____________________________________________  RADIOLOGY I, Sebastian Dzik J, personally viewed and evaluated these images (plain radiographs) as part of my medical decision making, as well as reviewing the written report by the radiologist.  ED MD interpretation: No ICH, no cervical spine  injury  Official radiology report(s): CT Head Wo Contrast  Result Date: 07/17/2020 CLINICAL DATA:  Unwitnessed fall, laceration, uncontrolled bleeding EXAM: CT HEAD WITHOUT CONTRAST CT CERVICAL SPINE WITHOUT CONTRAST TECHNIQUE: Multidetector CT imaging of the head and cervical spine was performed following the standard protocol without intravenous contrast. Multiplanar CT image reconstructions of the cervical spine were also generated. COMPARISON:  CT head 02/10/2020 FINDINGS: CT HEAD FINDINGS Brain: No evidence of acute infarction, hemorrhage, hydrocephalus, extra-axial collection, visible mass lesion or mass effect. Stable diffuse prominence of the ventricles, cisterns and sulci compatible with frontal predominant parenchymal volume loss. Patchy areas of white matter hypoattenuation are most compatible with chronic microvascular angiopathy. Vascular: Atherosclerotic calcification of the carotid siphons. No hyperdense vessel. Skull: Right frontal scalp thickening and swelling with soft tissue gas and laceration with overlying bandaging material. No subjacent calvarial fracture. No other significant sites of scalp swelling or hematoma.  Sinuses/Orbits: Minimal mural thickening in the ethmoids. Remaining paranasal sinuses and mastoid air cells are predominantly clear. Other: None. CT CERVICAL SPINE FINDINGS Alignment: A cervical stabilization collar is absent at time of examination. There is slight straightening and reversal of normal cervical lordosis centered at the C4-5 level. This may be positional, degenerative or related to muscle spasm. No evidence of traumatic listhesis. No abnormally widened, perched or jumped facets. Normal alignment of the craniocervical and atlantoaxial articulations. Skull base and vertebrae: No acute skull base fracture. No vertebral body fracture or height loss. Normal bone mineralization. No worrisome osseous lesions. Arthrosis at the atlantodental and basion dens interval with degenerative spurring. Multilevel cervical spondylitic changes, better detailed below. Enthesopathic mineralization along the nuchal/supraspinous ligament. Soft tissues and spinal canal: No pre or paravertebral fluid or swelling. No visible canal hematoma. Cervical carotid and vertebral artery atherosclerotic plaque. Airways patent. Disc levels: Multilevel intervertebral disc height loss with spondylitic endplate changes. Multilevel posterior disc osteophyte complex formations are present with some a face mint the ventral thecal sac and perhaps mild canal stenoses C4-5, C5-6 and C6-7. Multilevel uncinate spurring facet hypertrophic changes are present as well resulting in mild-to-moderate multilevel neural foraminal narrowing with more moderate to severe narrowing bilaterally C5-6 and on the right C6-7. Upper chest: No acute abnormality in the upper chest or imaged lung apices. Other: No concerning thyroid nodules or masses. Additional calcifications in the proximal great vessels. IMPRESSION: 1. Right frontal scalp thickening and swelling with soft tissue gas and laceration with overlying bandaging material. No subjacent calvarial fracture. 2.  No evidence of acute intracranial abnormality. 3. Stable frontal predominant parenchymal volume loss and chronic microvascular ischemic white matter disease. 4. No evidence of acute fracture or traumatic listhesis of the cervical spine. 5. Multilevel cervical spondylitic and facet degenerative changes, as above. 6. Cervical and intracranial atherosclerosis. Electronically Signed   By: Kreg Shropshire M.D.   On: 07/17/2020 02:56   CT Cervical Spine Wo Contrast  Result Date: 07/17/2020 CLINICAL DATA:  Unwitnessed fall, laceration, uncontrolled bleeding EXAM: CT HEAD WITHOUT CONTRAST CT CERVICAL SPINE WITHOUT CONTRAST TECHNIQUE: Multidetector CT imaging of the head and cervical spine was performed following the standard protocol without intravenous contrast. Multiplanar CT image reconstructions of the cervical spine were also generated. COMPARISON:  CT head 02/10/2020 FINDINGS: CT HEAD FINDINGS Brain: No evidence of acute infarction, hemorrhage, hydrocephalus, extra-axial collection, visible mass lesion or mass effect. Stable diffuse prominence of the ventricles, cisterns and sulci compatible with frontal predominant parenchymal volume loss. Patchy areas of white matter hypoattenuation are most compatible with chronic microvascular angiopathy.  Vascular: Atherosclerotic calcification of the carotid siphons. No hyperdense vessel. Skull: Right frontal scalp thickening and swelling with soft tissue gas and laceration with overlying bandaging material. No subjacent calvarial fracture. No other significant sites of scalp swelling or hematoma. Sinuses/Orbits: Minimal mural thickening in the ethmoids. Remaining paranasal sinuses and mastoid air cells are predominantly clear. Other: None. CT CERVICAL SPINE FINDINGS Alignment: A cervical stabilization collar is absent at time of examination. There is slight straightening and reversal of normal cervical lordosis centered at the C4-5 level. This may be positional, degenerative or  related to muscle spasm. No evidence of traumatic listhesis. No abnormally widened, perched or jumped facets. Normal alignment of the craniocervical and atlantoaxial articulations. Skull base and vertebrae: No acute skull base fracture. No vertebral body fracture or height loss. Normal bone mineralization. No worrisome osseous lesions. Arthrosis at the atlantodental and basion dens interval with degenerative spurring. Multilevel cervical spondylitic changes, better detailed below. Enthesopathic mineralization along the nuchal/supraspinous ligament. Soft tissues and spinal canal: No pre or paravertebral fluid or swelling. No visible canal hematoma. Cervical carotid and vertebral artery atherosclerotic plaque. Airways patent. Disc levels: Multilevel intervertebral disc height loss with spondylitic endplate changes. Multilevel posterior disc osteophyte complex formations are present with some a face mint the ventral thecal sac and perhaps mild canal stenoses C4-5, C5-6 and C6-7. Multilevel uncinate spurring facet hypertrophic changes are present as well resulting in mild-to-moderate multilevel neural foraminal narrowing with more moderate to severe narrowing bilaterally C5-6 and on the right C6-7. Upper chest: No acute abnormality in the upper chest or imaged lung apices. Other: No concerning thyroid nodules or masses. Additional calcifications in the proximal great vessels. IMPRESSION: 1. Right frontal scalp thickening and swelling with soft tissue gas and laceration with overlying bandaging material. No subjacent calvarial fracture. 2. No evidence of acute intracranial abnormality. 3. Stable frontal predominant parenchymal volume loss and chronic microvascular ischemic white matter disease. 4. No evidence of acute fracture or traumatic listhesis of the cervical spine. 5. Multilevel cervical spondylitic and facet degenerative changes, as above. 6. Cervical and intracranial atherosclerosis. Electronically Signed   By:  Kreg ShropshirePrice  DeHay M.D.   On: 07/17/2020 02:56    ____________________________________________   PROCEDURES  Procedure(s) performed (including Critical Care):  Marland Kitchen.Marland Kitchen.Laceration Repair  Date/Time: 07/17/2020 1:37 AM Performed by: Irean HongSung, Dymir Neeson J, MD Authorized by: Irean HongSung, Panhia Karl J, MD   Consent:    Consent obtained:  Verbal   Consent given by:  Patient   Risks discussed:  Infection, pain, retained foreign body, poor cosmetic result and poor wound healing Anesthesia:    Anesthesia method:  Local infiltration   Local anesthetic:  Lidocaine 1% WITH epi Laceration details:    Location:  Face   Face location:  Forehead   Length (cm):  5   Depth (mm):  5 Pre-procedure details:    Preparation:  Patient was prepped and draped in usual sterile fashion Exploration:    Hemostasis achieved with:  Direct pressure   Wound exploration: entire depth of wound visualized     Contaminated: yes   Treatment:    Area cleansed with:  Saline   Amount of cleaning:  Standard   Irrigation solution:  Sterile saline   Irrigation method:  Tap   Visualized foreign bodies/material removed: no     Debridement:  None   Layers/structures repaired:  Deep subcutaneous Deep subcutaneous:    Suture size:  4-0   Suture material:  Vicryl   Suture technique:  Simple interrupted  Number of sutures:  3 Skin repair:    Repair method:  Sutures   Suture size:  4-0   Suture material:  Nylon   Suture technique:  Running   Number of sutures: continuous. Approximation:    Approximation:  Close Repair type:    Repair type:  Simple Post-procedure details:    Dressing:  Sterile dressing and bulky dressing   Procedure completion:  Tolerated well, no immediate complications     ____________________________________________   INITIAL IMPRESSION / ASSESSMENT AND PLAN / ED COURSE  As part of my medical decision making, I reviewed the following data within the electronic MEDICAL RECORD NUMBER Nursing notes reviewed and  incorporated, Labs reviewed, Old chart reviewed, Radiograph reviewed and Notes from prior ED visits     87 year old male presenting with facial laceration >4 hours status post mechanical fall with arterial bleeding.  On Eliquis for paroxysmal atrial fibrillation.  Differential diagnosis includes but is not limited to ICH, SDH, cervical spine injury, acute blood loss, etc.  Patient was immediately sutured upon his arrival to the treatment room.  Tolerated sutures well.  Pressure dressing applied; will reexamine in 20 minutes.  Will obtain basic lab work, CT head and cervical spine, update tetanus, administer IV Ancef and reassess.   Clinical Course as of 07/17/20 0357  Wynelle LinkSun Jul 17, 2020  0310 Updated patient on laboratory results and negative CT scans.  There has been no bleeding through the bandage.  Patient's tetanus has been updated.  We will place him on antibiotics and he will follow-up closely with his PCP.  Strict return precautions given.  Patient verbalizes understanding and agrees with plan of care. [JS]    Clinical Course User Index [JS] Irean HongSung, Marolyn Urschel J, MD     ____________________________________________   FINAL CLINICAL IMPRESSION(S) / ED DIAGNOSES  Final diagnoses:  Fall, initial encounter  Minor head injury, initial encounter  Facial laceration, initial encounter     ED Discharge Orders         Ordered    cephALEXin (KEFLEX) 250 MG capsule  3 times daily        07/17/20 [id number]          *Please note:  Graylin ShiverErnest E Vanstone Jr. was evaluated in Emergency Department on 07/17/2020 for the symptoms described in the history of present illness. He was evaluated in the context of the global COVID-19 pandemic, which necessitated consideration that the patient might be at risk for infection with the SARS-CoV-2 virus that causes COVID-19. Institutional protocols and algorithms that pertain to the evaluation of patients at risk for COVID-19 are in a state of rapid change based on  information released by regulatory bodies including the CDC and federal and state organizations. These policies and algorithms were followed during the patient's care in the ED.  Some ED evaluations and interventions may be delayed as a result of limited staffing during and the pandemic.*   Note:  This document was prepared using Dragon voice recognition software and may include unintentional dictation errors.   Irean HongSung, Seren Chaloux J, MD 07/17/20 [phone]

## 2020-07-17 NOTE — ED Triage Notes (Addendum)
Pt dropped off by grandson from an unwitnessed fall that happened around 6pm last night. Pt presents with large laceration to head, bleeding not controlled. EDP at bedside.

## 2020-07-17 NOTE — ED Notes (Signed)
Suture covered with bacitracin, one 4x4 gauze, gauze wrapped around head once with koband.

## 2020-07-21 ENCOUNTER — Ambulatory Visit (INDEPENDENT_AMBULATORY_CARE_PROVIDER_SITE_OTHER): Payer: Medicare Other | Admitting: Nurse Practitioner

## 2020-07-21 ENCOUNTER — Other Ambulatory Visit: Payer: Self-pay

## 2020-07-21 ENCOUNTER — Encounter: Payer: Self-pay | Admitting: Nurse Practitioner

## 2020-07-21 DIAGNOSIS — S0101XA Laceration without foreign body of scalp, initial encounter: Secondary | ICD-10-CM | POA: Diagnosis not present

## 2020-07-21 DIAGNOSIS — Z5189 Encounter for other specified aftercare: Secondary | ICD-10-CM

## 2020-07-21 IMAGING — DX PORTABLE CHEST - 1 VIEW
1 series · 1 of 1 positions shown · non-contrast
Comparison: None.

CLINICAL DATA: Shortness of breath

EXAM:
PORTABLE CHEST 1 VIEW

[chest ap]
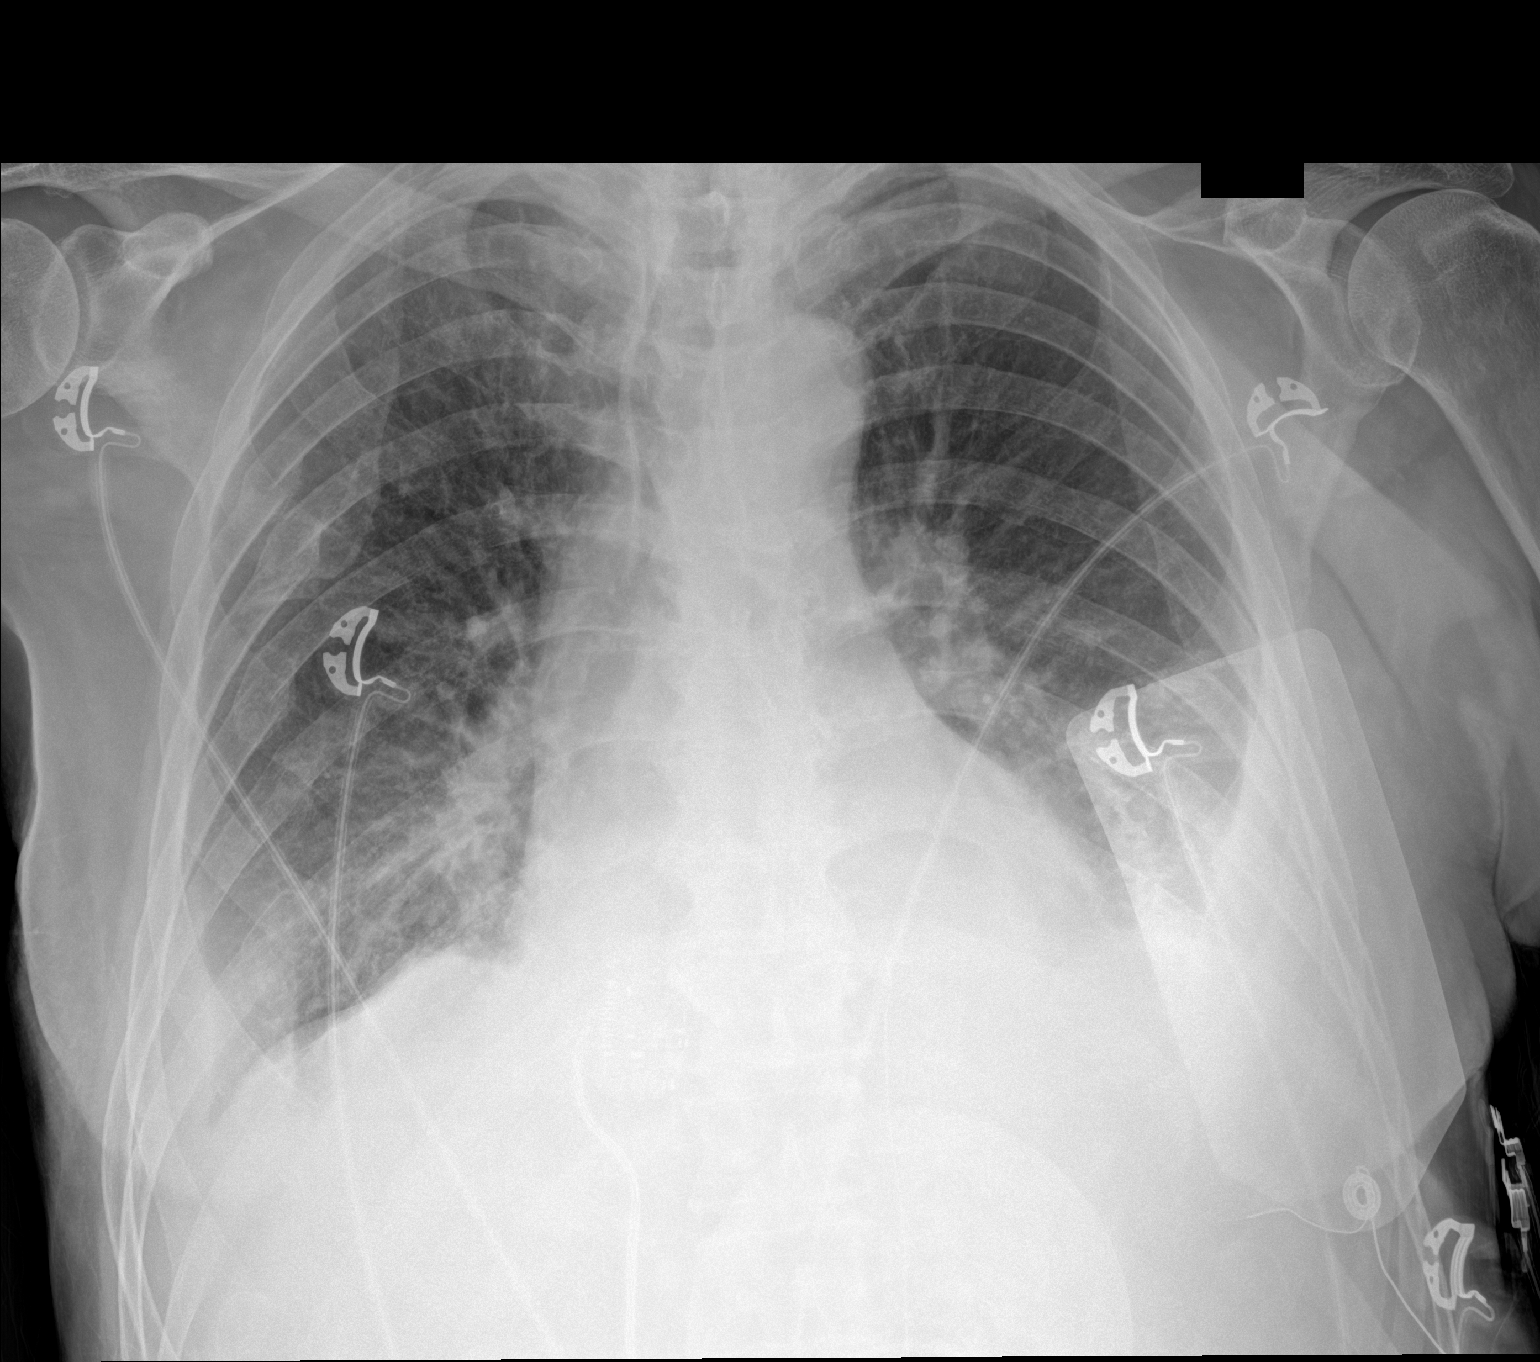

[1 of 1 positions shown; findings below may reference images not displayed]

FINDINGS: There are small pleural effusions bilaterally with bibasilar
atelectasis. There is no edema or consolidation. Heart is upper
normal in size with pulmonary vascularity normal. No adenopathy.
There old healed rib fractures on the right.
IMPRESSION: Small pleural effusions with bibasilar atelectasis. No frank edema
or consolidation. Heart upper normal in size.

## 2020-07-21 NOTE — Assessment & Plan Note (Signed)
Approximation present and no s/s infection.  Long suture removed and steri strips applied.  Recommend he pick up abx treatment and will have return in one week for wound check.

## 2020-07-21 NOTE — Patient Instructions (Signed)
Wound Care, Adult Taking care of your wound properly can help to prevent pain, infection, and scarring. It can also help your wound heal more quickly. Follow instructions from your health care provider about how to care for your wound. Supplies needed:  Soap and water.  Wound cleanser.  Gauze.  If needed, a clean bandage (dressing) or other type of wound dressing material to cover or place in the wound. Follow your health care provider's instructions about what dressing supplies to use.  Cream or ointment to apply to the wound, if told by your health care provider. How to care for your wound Cleaning the wound Ask your health care provider how to clean the wound. This may include:  Using mild soap and water or a wound cleanser.  Using a clean gauze to pat the wound dry after cleaning it. Do not rub or scrub the wound. Dressing care  Wash your hands with soap and water for at least 20 seconds before and after you change the dressing. If soap and water are not available, use hand sanitizer.  Change your dressing as told by your health care provider. This may include: ? Cleaning or rinsing out (irrigating) the wound. ? Placing a dressing over the wound or in the wound (packing). ? Covering the wound with an outer dressing.  Leave any stitches (sutures), skin glue, or adhesive strips in place. These skin closures may need to stay in place for 2 weeks or longer. If adhesive strip edges start to loosen and curl up, you may trim the loose edges. Do not remove adhesive strips completely unless your health care provider tells you to do that.  Ask your health care provider when you can leave the wound uncovered. Checking for infection Check your wound area every day for signs of infection. Check for:  More redness, swelling, or pain.  Fluid or blood.  Warmth.  Pus or a bad smell.   Follow these instructions at home Medicines  If you were prescribed an antibiotic medicine, cream, or  ointment, take or apply it as told by your health care provider. Do not stop using the antibiotic even if your condition improves.  If you were prescribed pain medicine, take it 30 minutes before you do any wound care or as told by your health care provider.  Take over-the-counter and prescription medicines only as told by your health care provider. Eating and drinking  Eat a diet that includes protein, vitamin A, vitamin C, and other nutrient-rich foods to help the wound heal. ? Foods rich in protein include meat, fish, eggs, dairy, beans, and nuts. ? Foods rich in vitamin A include carrots and dark green, leafy vegetables. ? Foods rich in vitamin C include citrus fruits, tomatoes, broccoli, and peppers.  Drink enough fluid to keep your urine pale yellow. General instructions  Do not take baths, swim, use a hot tub, or do anything that would put the wound underwater until your health care provider approves. Ask your health care provider if you may take showers. You may only be allowed to take sponge baths.  Do not scratch or pick at the wound. Keep it covered as told by your health care provider.  Return to your normal activities as told by your health care provider. Ask your health care provider what activities are safe for you.  Protect your wound from the sun when you are outside for the first 6 months, or for as long as told by your health care provider. Cover   up the scar area or apply sunscreen that has an SPF of at least 30.  Do not use any products that contain nicotine or tobacco, such as cigarettes, e-cigarettes, and chewing tobacco. These may delay wound healing. If you need help quitting, ask your health care provider.  Keep all follow-up visits as told by your health care provider. This is important. Contact a health care provider if:  You received a tetanus shot and you have swelling, severe pain, redness, or bleeding at the injection site.  Your pain is not controlled  with medicine.  You have any of these signs of infection: ? More redness, swelling, or pain around the wound. ? Fluid or blood coming from the wound. ? Warmth coming from the wound. ? Pus or a bad smell coming from the wound. ? A fever or chills.  You are nauseous or you vomit.  You are dizzy. Get help right away if:  You have a red streak of skin near the area around your wound.  Your wound has been closed with staples, sutures, skin glue, or adhesive strips and it begins to open up and separate.  Your wound is bleeding, and the bleeding does not stop with gentle pressure.  You have a rash.  You faint.  You have trouble breathing. These symptoms may represent a serious problem that is an emergency. Do not wait to see if the symptoms will go away. Get medical help right away. Call your local emergency services (911 in the [place] Do not drive yourself to the hospital. Summary  Always wash your hands with soap and water for at least 20 seconds before and after changing your dressing.  Change your dressing as told by your health care provider.  To help with healing, eat foods that are rich in protein, vitamin A, vitamin C, and other nutrients.  Check your wound every day for signs of infection. Contact your health care provider if you suspect that your wound is infected. This information is not intended to replace advice given to you by your health care provider. Make sure you discuss any questions you have with your health care provider. Document Revised: 04/10/2019 Document Reviewed: 04/10/2019 Elsevier Patient Education  2021 Elsevier Inc.  

## 2020-07-21 NOTE — Progress Notes (Signed)
BP (!) 146/80   Pulse (!) 53   Temp 98.1 F (36.7 C) (Oral)   Ht 5' 6.14" (1.68 m)   Wt 164 lb 3.2 oz (74.5 kg)   SpO2 99%   BMI 26.39 kg/m    Subjective:    Patient ID: Caleb Shiver., male    DOB: 1932-02-10, 87 y.o.   MRN: [id number]  HPI: Caleb Fleming is a 87 y.o. male  Chief Complaint  Patient presents with  . Suture / Staple Removal   ER FOLLOW UP Was seen in the ER on 07/17/20.  Had a fall with head wound to right frontal aspect occiput.  Fell on [employer] in his yard, it was darker out.  Had uncontrolled bleeding due to being on Eliquis for PAF.  Area was was sutured with x 1 continuous stitch and he was prescribed Keflex, which he has not picked up.  CT noted not fractures or bleed. Time since discharge: 4 days Hospital/facility: [facility] Diagnosis: Fall with laceration Procedures/tests: CT and labs -- CBC mild elevation in WBC at 12.9 and mild low HGB at 12, K+ 3.4 -- ongoing kidney disease noted GFR 43 Consultants: none New medications: Keflex he has not picked up Discharge instructions:  Follow-up with PCP Status: stable  Relevant past medical, surgical, family and social history reviewed and updated as indicated. Interim medical history since our last visit reviewed. Allergies and medications reviewed and updated.  Review of Systems  Constitutional: Negative for activity change, diaphoresis, fatigue and fever.  Respiratory: Negative for cough, chest tightness, shortness of breath and wheezing.   Cardiovascular: Negative for chest pain, palpitations and leg swelling.  Skin: Positive for wound.  Neurological: Negative.   Psychiatric/Behavioral: Negative.     Per HPI unless specifically indicated above     Objective:    BP (!) 146/80   Pulse (!) 53   Temp 98.1 F (36.7 C) (Oral)   Ht 5' 6.14" (1.68 m)   Wt 164 lb 3.2 oz (74.5 kg)   SpO2 99%   BMI 26.39 kg/m   Wt Readings from Last 3 Encounters:  07/21/20 164 lb 3.2 oz (74.5 kg)   05/18/20 158 lb 3.2 oz (71.8 kg)  03/03/20 153 lb (69.4 kg)    Physical Exam Vitals and nursing note reviewed.  Constitutional:      General: He is awake.     Appearance: He is well-developed, well-groomed and overweight. He is not ill-appearing.  HENT:     Head: Normocephalic. Laceration present.      Comments: X 1 long running suture removed from laceration by CMA with NP assistance.  Area approximated with no drainage.  Steri strips applied to area and non-stick dressing.    Right Ear: Hearing normal. No drainage.     Left Ear: Hearing normal. No drainage.  Eyes:     General: Lids are normal.        Right eye: No discharge.        Left eye: No discharge.     Conjunctiva/sclera: Conjunctivae normal.     Pupils: Pupils are equal, round, and reactive to light.  Neck:     Thyroid: No thyromegaly.     Vascular: No carotid bruit or JVD.     Trachea: Trachea normal.  Cardiovascular:     Rate and Rhythm: Normal rate and regular rhythm.     Heart sounds: Normal heart sounds, S1 normal and S2 normal. No murmur heard. No gallop.  Pulmonary:     Effort: Pulmonary effort is normal.     Breath sounds: Normal breath sounds.  Abdominal:     General: Bowel sounds are normal.     Palpations: Abdomen is soft. There is no hepatomegaly or splenomegaly.  Musculoskeletal:        General: Normal range of motion.     Cervical back: Normal range of motion and neck supple.     Right lower leg: No edema.     Left lower leg: No edema.  Skin:    General: Skin is warm and dry.     Capillary Refill: Capillary refill takes less than 2 seconds.     Findings: No rash.  Neurological:     Mental Status: He is alert and oriented to person, place, and time.     Cranial Nerves: Cranial nerves are intact.     Coordination: Coordination is intact.     Gait: Gait is intact.     Deep Tendon Reflexes: Reflexes are normal and symmetric.  Psychiatric:        Attention and Perception: Attention normal.         Mood and Affect: Mood normal.        Speech: Speech normal.        Behavior: Behavior normal. Behavior is cooperative.        Thought Content: Thought content normal.     Results for orders placed or performed during the hospital encounter of 07/17/20  CBC with Differential  Result Value Ref Range   WBC 12.9 (H) 4.0 - 10.5 K/uL   RBC 3.87 (L) 4.22 - 5.81 MIL/uL   Hemoglobin 12.0 (L) 13.0 - 17.0 g/dL   HCT 12.4 (L) 58.0 - 99.8 %   MCV 93.3 80.0 - 100.0 fL   MCH 31.0 26.0 - 34.0 pg   MCHC 33.2 30.0 - 36.0 g/dL   RDW 33.8 25.0 - 53.9 %   Platelets 274 150 - 400 K/uL   nRBC 0.0 0.0 - 0.2 %   Neutrophils Relative % 68 %   Neutro Abs 8.8 (H) 1.7 - 7.7 K/uL   Lymphocytes Relative 16 %   Lymphs Abs 2.1 0.7 - 4.0 K/uL   Monocytes Relative 11 %   Monocytes Absolute 1.4 (H) 0.1 - 1.0 K/uL   Eosinophils Relative 3 %   Eosinophils Absolute 0.4 0.0 - 0.5 K/uL   Basophils Relative 1 %   Basophils Absolute 0.1 0.0 - 0.1 K/uL   Immature Granulocytes 1 %   Abs Immature Granulocytes 0.10 (H) 0.00 - 0.07 K/uL  Basic metabolic panel  Result Value Ref Range   Sodium 136 135 - 145 mmol/L   Potassium 3.4 (L) 3.5 - 5.1 mmol/L   Chloride 100 98 - 111 mmol/L   CO2 24 22 - 32 mmol/L   Glucose, Bld 97 70 - 99 mg/dL   BUN 23 8 - 23 mg/dL   Creatinine, Ser 7.67 (H) 0.61 - 1.24 mg/dL   Calcium 8.3 (L) 8.9 - 10.3 mg/dL   GFR, Estimated 43 (L) >60 mL/min   Anion gap 12 5 - 15      Assessment & Plan:   Problem List Items Addressed This Visit      Other   Encounter for wound care    Laceration to right frontal occiput.  Area with no erythema, warmth, or drainage.  Steri strips applied.  Will have return in one week for wound check and recommend he start taking Keflex  as prescribed.      Laceration of scalp    Approximation present and no s/s infection.  Long suture removed and steri strips applied.  Recommend he pick up abx treatment and will have return in one week for wound check.           Follow up plan: Return in about 1 week (around 07/28/2020) for Wound check.

## 2020-07-21 NOTE — Assessment & Plan Note (Signed)
Laceration to right frontal occiput.  Area with no erythema, warmth, or drainage.  Steri strips applied.  Will have return in one week for wound check and recommend he start taking Keflex as prescribed.

## 2020-07-24 IMAGING — DX PORTABLE CHEST - 1 VIEW
1 series · 1 of 1 positions shown · non-contrast
Comparison: 12/09/2018

CLINICAL DATA: Increasing shortness of breath

EXAM:
PORTABLE CHEST 1 VIEW

[chest ap]
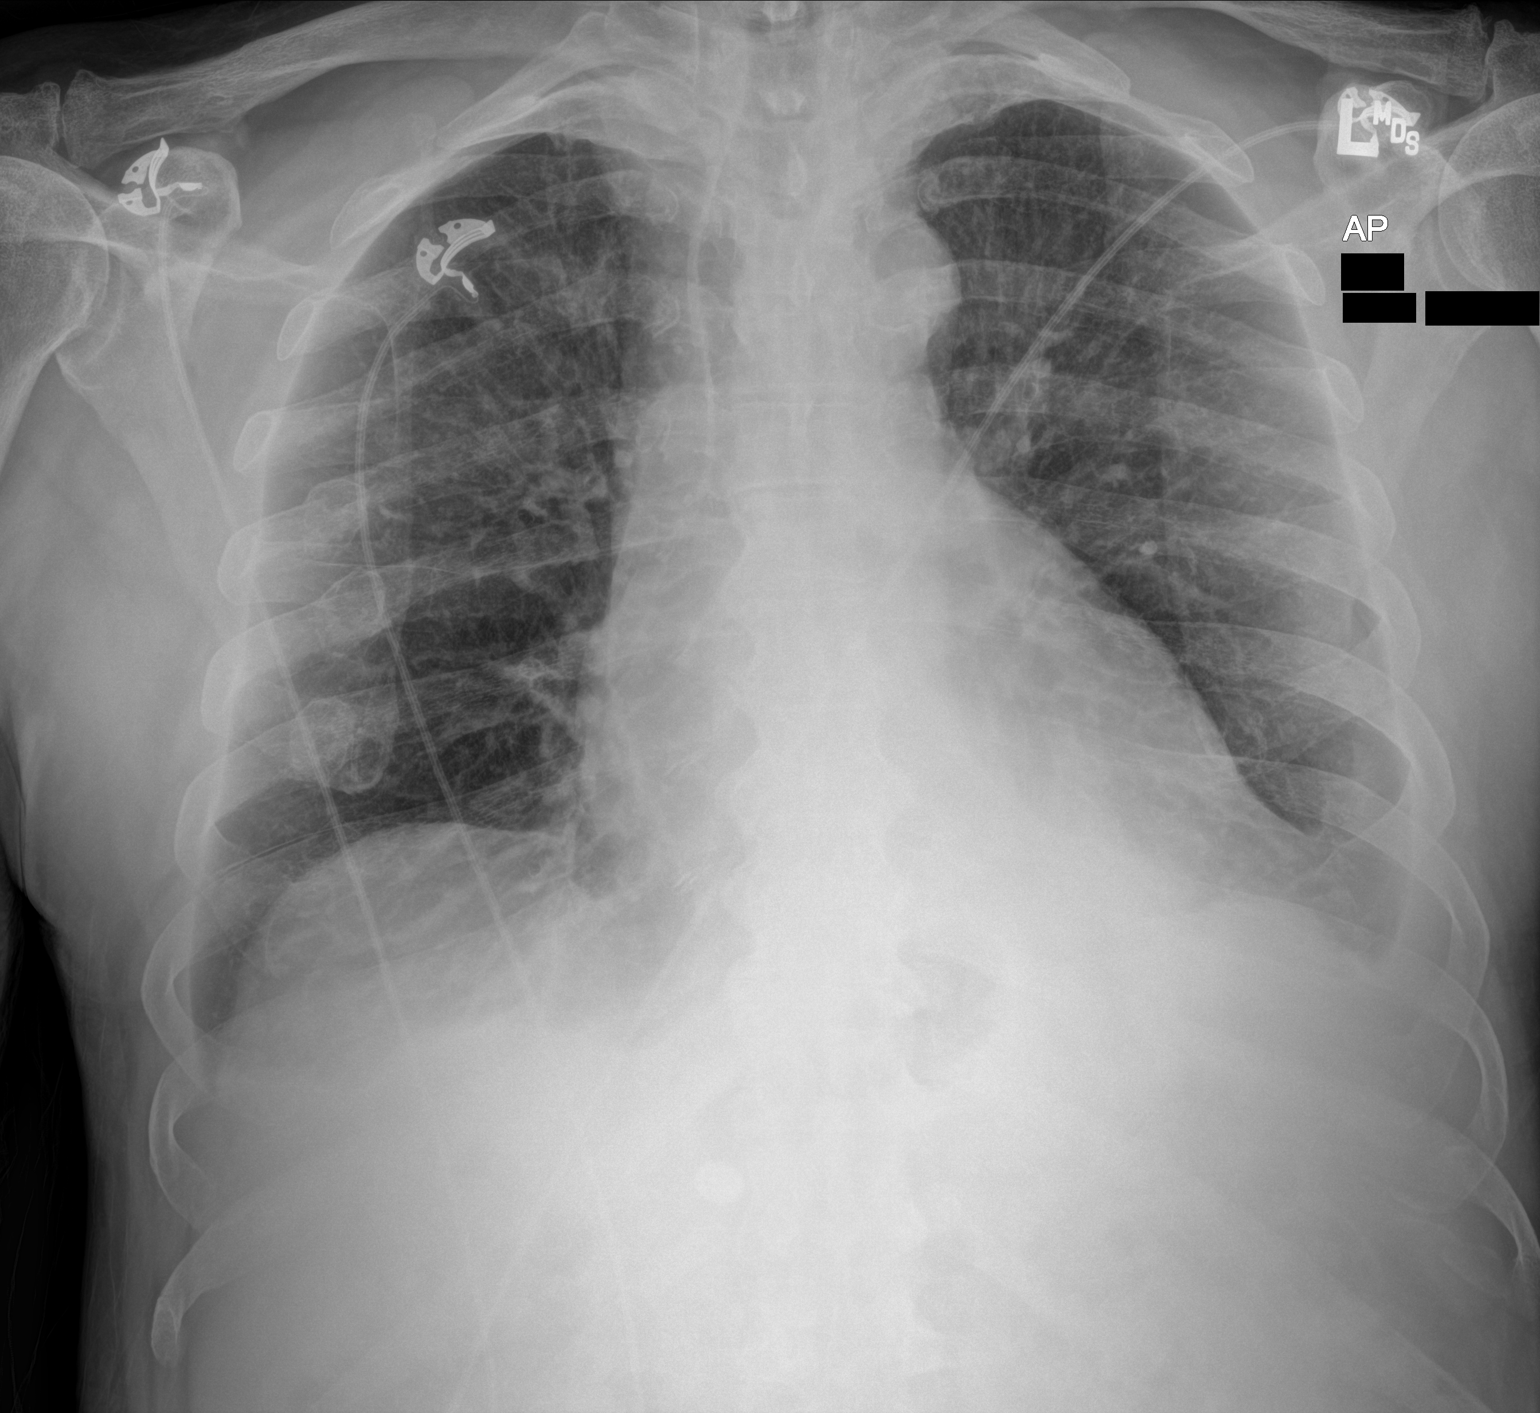

[1 of 1 positions shown; findings below may reference images not displayed]

FINDINGS: Cardiac shadow is at the upper limits of normal in size but
accentuated by the frontal technique. The lungs are well aerated
bilaterally with the exception of a small left pleural effusion. No
focal infiltrate is seen. Old rib fractures are noted on the right.
IMPRESSION: Small left pleural effusion.

Old rib fractures on the right.

## 2020-07-28 ENCOUNTER — Encounter: Payer: Self-pay | Admitting: Nurse Practitioner

## 2020-07-28 ENCOUNTER — Other Ambulatory Visit: Payer: Self-pay

## 2020-07-28 ENCOUNTER — Ambulatory Visit (INDEPENDENT_AMBULATORY_CARE_PROVIDER_SITE_OTHER): Payer: Medicare Other | Admitting: Nurse Practitioner

## 2020-07-28 VITALS — BP 136/78 | HR 51 | Temp 97.9°F | Ht 66.14 in | Wt 164.0 lb

## 2020-07-28 DIAGNOSIS — S0101XD Laceration without foreign body of scalp, subsequent encounter: Secondary | ICD-10-CM | POA: Diagnosis not present

## 2020-07-28 DIAGNOSIS — Z5189 Encounter for other specified aftercare: Secondary | ICD-10-CM

## 2020-07-28 NOTE — Patient Instructions (Signed)
Wound Care, Adult Taking care of your wound properly can help to prevent pain, infection, and scarring. It can also help your wound heal more quickly. Follow instructions from your health care provider about how to care for your wound. Supplies needed:  Soap and water.  Wound cleanser.  Gauze.  If needed, a clean bandage (dressing) or other type of wound dressing material to cover or place in the wound. Follow your health care provider's instructions about what dressing supplies to use.  Cream or ointment to apply to the wound, if told by your health care provider. How to care for your wound Cleaning the wound Ask your health care provider how to clean the wound. This may include:  Using mild soap and water or a wound cleanser.  Using a clean gauze to pat the wound dry after cleaning it. Do not rub or scrub the wound. Dressing care  Wash your hands with soap and water for at least 20 seconds before and after you change the dressing. If soap and water are not available, use hand sanitizer.  Change your dressing as told by your health care provider. This may include: ? Cleaning or rinsing out (irrigating) the wound. ? Placing a dressing over the wound or in the wound (packing). ? Covering the wound with an outer dressing.  Leave any stitches (sutures), skin glue, or adhesive strips in place. These skin closures may need to stay in place for 2 weeks or longer. If adhesive strip edges start to loosen and curl up, you may trim the loose edges. Do not remove adhesive strips completely unless your health care provider tells you to do that.  Ask your health care provider when you can leave the wound uncovered. Checking for infection Check your wound area every day for signs of infection. Check for:  More redness, swelling, or pain.  Fluid or blood.  Warmth.  Pus or a bad smell.   Follow these instructions at home Medicines  If you were prescribed an antibiotic medicine, cream, or  ointment, take or apply it as told by your health care provider. Do not stop using the antibiotic even if your condition improves.  If you were prescribed pain medicine, take it 30 minutes before you do any wound care or as told by your health care provider.  Take over-the-counter and prescription medicines only as told by your health care provider. Eating and drinking  Eat a diet that includes protein, vitamin A, vitamin C, and other nutrient-rich foods to help the wound heal. ? Foods rich in protein include meat, fish, eggs, dairy, beans, and nuts. ? Foods rich in vitamin A include carrots and dark green, leafy vegetables. ? Foods rich in vitamin C include citrus fruits, tomatoes, broccoli, and peppers.  Drink enough fluid to keep your urine pale yellow. General instructions  Do not take baths, swim, use a hot tub, or do anything that would put the wound underwater until your health care provider approves. Ask your health care provider if you may take showers. You may only be allowed to take sponge baths.  Do not scratch or pick at the wound. Keep it covered as told by your health care provider.  Return to your normal activities as told by your health care provider. Ask your health care provider what activities are safe for you.  Protect your wound from the sun when you are outside for the first 6 months, or for as long as told by your health care provider. Cover   up the scar area or apply sunscreen that has an SPF of at least 30.  Do not use any products that contain nicotine or tobacco, such as cigarettes, e-cigarettes, and chewing tobacco. These may delay wound healing. If you need help quitting, ask your health care provider.  Keep all follow-up visits as told by your health care provider. This is important. Contact a health care provider if:  You received a tetanus shot and you have swelling, severe pain, redness, or bleeding at the injection site.  Your pain is not controlled  with medicine.  You have any of these signs of infection: ? More redness, swelling, or pain around the wound. ? Fluid or blood coming from the wound. ? Warmth coming from the wound. ? Pus or a bad smell coming from the wound. ? A fever or chills.  You are nauseous or you vomit.  You are dizzy. Get help right away if:  You have a red streak of skin near the area around your wound.  Your wound has been closed with staples, sutures, skin glue, or adhesive strips and it begins to open up and separate.  Your wound is bleeding, and the bleeding does not stop with gentle pressure.  You have a rash.  You faint.  You have trouble breathing. These symptoms may represent a serious problem that is an emergency. Do not wait to see if the symptoms will go away. Get medical help right away. Call your local emergency services (911 in the [place] Do not drive yourself to the hospital. Summary  Always wash your hands with soap and water for at least 20 seconds before and after changing your dressing.  Change your dressing as told by your health care provider.  To help with healing, eat foods that are rich in protein, vitamin A, vitamin C, and other nutrients.  Check your wound every day for signs of infection. Contact your health care provider if you suspect that your wound is infected. This information is not intended to replace advice given to you by your health care provider. Make sure you discuss any questions you have with your health care provider. Document Revised: 04/10/2019 Document Reviewed: 04/10/2019 Elsevier Patient Education  2021 Elsevier Inc.  

## 2020-07-28 NOTE — Progress Notes (Signed)
BP 136/78 (BP Location: Left Arm)   Pulse (!) 51   Temp 97.9 F (36.6 C)   Ht 5' 6.14" (1.68 m)   Wt 164 lb (74.4 kg)   SpO2 100%   BMI 26.36 kg/m    Subjective:    Patient ID: Caleb Fleming., male    DOB: December 29, 1931, 87 y.o.   MRN: [id number]  HPI: Caleb Fleming is a 87 y.o. male  Chief Complaint  Patient presents with  . F/U head wound    Sutures removed at last visit   SKIN WOUND Follow-up for wound check, seen initially 07/21/20.  Sutures removed at that visit and steri strips applied.   Location: frontal occiput History of trauma in area: yes Pain: no Redness: no Swelling: no Oozing: no Pus: no Fevers: no Nausea/vomiting: no Status: better Treatments attempted:abx  Tetanus: UTD  Relevant past medical, surgical, family and social history reviewed and updated as indicated. Interim medical history since our last visit reviewed. Allergies and medications reviewed and updated.  Review of Systems  Constitutional: Negative for activity change, diaphoresis, fatigue and fever.  Respiratory: Negative for cough, chest tightness, shortness of breath and wheezing.   Cardiovascular: Negative for chest pain, palpitations and leg swelling.  Skin: Positive for wound.  Neurological: Negative.   Psychiatric/Behavioral: Negative.     Per HPI unless specifically indicated above     Objective:    BP 136/78 (BP Location: Left Arm)   Pulse (!) 51   Temp 97.9 F (36.6 C)   Ht 5' 6.14" (1.68 m)   Wt 164 lb (74.4 kg)   SpO2 100%   BMI 26.36 kg/m   Wt Readings from Last 3 Encounters:  07/28/20 164 lb (74.4 kg)  07/21/20 164 lb 3.2 oz (74.5 kg)  05/18/20 158 lb 3.2 oz (71.8 kg)    Physical Exam Vitals and nursing note reviewed.  Constitutional:      General: He is awake.     Appearance: He is well-developed, well-groomed and overweight. He is not ill-appearing.  HENT:     Head: Normocephalic. Laceration present.      Comments: Area cleaned and  bandage with abx ointment applied    Right Ear: Hearing normal. No drainage.     Left Ear: Hearing normal. No drainage.  Eyes:     General: Lids are normal.        Right eye: No discharge.        Left eye: No discharge.     Conjunctiva/sclera: Conjunctivae normal.     Pupils: Pupils are equal, round, and reactive to light.  Neck:     Thyroid: No thyromegaly.     Vascular: No carotid bruit or JVD.     Trachea: Trachea normal.  Cardiovascular:     Rate and Rhythm: Normal rate and regular rhythm.     Heart sounds: Normal heart sounds, S1 normal and S2 normal. No murmur heard. No gallop.   Pulmonary:     Effort: Pulmonary effort is normal.     Breath sounds: Normal breath sounds.  Abdominal:     General: Bowel sounds are normal.     Palpations: Abdomen is soft. There is no hepatomegaly or splenomegaly.  Musculoskeletal:        General: Normal range of motion.     Cervical back: Normal range of motion and neck supple.     Right lower leg: No edema.     Left lower leg: No edema.  Skin:    General: Skin is warm and dry.     Capillary Refill: Capillary refill takes less than 2 seconds.     Findings: No rash.  Neurological:     Mental Status: He is alert and oriented to person, place, and time.     Cranial Nerves: Cranial nerves are intact.     Coordination: Coordination is intact.     Gait: Gait is intact.     Deep Tendon Reflexes: Reflexes are normal and symmetric.  Psychiatric:        Attention and Perception: Attention normal.        Mood and Affect: Mood normal.        Speech: Speech normal.        Behavior: Behavior normal. Behavior is cooperative.        Thought Content: Thought content normal.     Results for orders placed or performed during the hospital encounter of 07/17/20  CBC with Differential  Result Value Ref Range   WBC 12.9 (H) 4.0 - 10.5 K/uL   RBC 3.87 (L) 4.22 - 5.81 MIL/uL   Hemoglobin 12.0 (L) 13.0 - 17.0 g/dL   HCT 28.0 (L) 03.4 - 91.7 %   MCV 93.3  80.0 - 100.0 fL   MCH 31.0 26.0 - 34.0 pg   MCHC 33.2 30.0 - 36.0 g/dL   RDW 91.5 05.6 - 97.9 %   Platelets 274 150 - 400 K/uL   nRBC 0.0 0.0 - 0.2 %   Neutrophils Relative % 68 %   Neutro Abs 8.8 (H) 1.7 - 7.7 K/uL   Lymphocytes Relative 16 %   Lymphs Abs 2.1 0.7 - 4.0 K/uL   Monocytes Relative 11 %   Monocytes Absolute 1.4 (H) 0.1 - 1.0 K/uL   Eosinophils Relative 3 %   Eosinophils Absolute 0.4 0.0 - 0.5 K/uL   Basophils Relative 1 %   Basophils Absolute 0.1 0.0 - 0.1 K/uL   Immature Granulocytes 1 %   Abs Immature Granulocytes 0.10 (H) 0.00 - 0.07 K/uL  Basic metabolic panel  Result Value Ref Range   Sodium 136 135 - 145 mmol/L   Potassium 3.4 (L) 3.5 - 5.1 mmol/L   Chloride 100 98 - 111 mmol/L   CO2 24 22 - 32 mmol/L   Glucose, Bld 97 70 - 99 mg/dL   BUN 23 8 - 23 mg/dL   Creatinine, Ser 4.80 (H) 0.61 - 1.24 mg/dL   Calcium 8.3 (L) 8.9 - 10.3 mg/dL   GFR, Estimated 43 (L) >60 mL/min   Anion gap 12 5 - 15      Assessment & Plan:   Problem List Items Addressed This Visit      Other   Laceration of scalp    Acute and improved at this time with no s/s infection.  At this time will monitor and have return in May as scheduled.  He is aware to return prior to this is any issues with wound present, overall well approximated at this time.       Other Visit Diagnoses    Visit for wound check    -  Primary       Follow up plan: Return if symptoms worsen or fail to improve, for as schedule in May.

## 2020-07-28 NOTE — Assessment & Plan Note (Signed)
Acute and improved at this time with no s/s infection.  At this time will monitor and have return in May as scheduled.  He is aware to return prior to this is any issues with wound present, overall well approximated at this time.

## 2020-08-06 ENCOUNTER — Other Ambulatory Visit: Payer: Self-pay | Admitting: Family Medicine

## 2020-08-06 NOTE — Telephone Encounter (Signed)
Requested medication (s) are due for refill today: -  Requested medication (s) are on the active medication list: historical med  Last refill:  03/03/20  Future visit scheduled: no  Notes to clinic:  historical med   Requested Prescriptions  Pending Prescriptions Disp Refills   furosemide (LASIX) 40 MG tablet [Pharmacy Med Name: FUROSEMIDE 40 MG TABLET] 180 tablet 3    Sig: TAKE 1 TABLET BY MOUTH TWICE A DAY      Cardiovascular:  Diuretics - Loop Failed - 08/06/2020 10:35 AM      Failed - K in normal range and within 360 days    Potassium  Date Value Ref Range Status  07/17/2020 3.4 (L) 3.5 - 5.1 mmol/L Final          Failed - Ca in normal range and within 360 days    Calcium  Date Value Ref Range Status  07/17/2020 8.3 (L) 8.9 - 10.3 mg/dL Final          Failed - Cr in normal range and within 360 days    Creatinine, Ser  Date Value Ref Range Status  07/17/2020 1.57 (H) 0.61 - 1.24 mg/dL Final          Passed - Na in normal range and within 360 days    Sodium  Date Value Ref Range Status  07/17/2020 136 135 - 145 mmol/L Final  05/18/2020 136 134 - 144 mmol/L Final          Passed - Last BP in normal range    BP Readings from Last 1 Encounters:  07/28/20 136/78          Passed - Valid encounter within last 6 months    Recent Outpatient Visits           1 week ago Visit for wound check   [facility] [place], Dorie Rank, NP   2 weeks ago Encounter for wound care   [facility] [place], Corrie Dandy T, NP   2 months ago AF (paroxysmal atrial fibrillation) (HCC)   [facility] [place], [place] T, NP   5 months ago Atrial fibrillation with rapid ventricular response [facility])   [facility] Particia Nearing, [place]   5 months ago Erroneous encounter - disregard   [facility] Particia Nearing, [place]

## 2020-08-08 NOTE — Telephone Encounter (Signed)
Patient last seen 05/18/20

## 2020-08-08 NOTE — Telephone Encounter (Signed)
Medication has not been given by this office.  Patient will need to get it from specialist.

## 2020-08-12 ENCOUNTER — Telehealth: Payer: Medicare Other

## 2020-08-12 ENCOUNTER — Telehealth: Payer: Self-pay | Admitting: General Practice

## 2020-08-12 NOTE — Telephone Encounter (Signed)
  Chronic Care Management   Outreach Note  08/12/2020 Name: Caleb Fleming. MRN: [id number] DOB: 1931/09/13  Referred by: Larae Grooms, NP Reason for referral : Appointment (RNCM: Follow up for Chronic Disease Management and Care Coordination Needs- 2nd attempt)   A second unsuccessful telephone outreach was attempted today. The patient was referred to the case management team for assistance with care management and care coordination.   Follow Up Plan: A HIPAA compliant phone message was left for the patient providing contact information and requesting a return call.   [name] RN, MSN, CCM Community Care Coordinator [place]  [facility] Mobile: [phone]

## 2020-08-17 ENCOUNTER — Other Ambulatory Visit: Payer: Self-pay | Admitting: Family Medicine

## 2020-08-17 NOTE — Telephone Encounter (Signed)
Requested medications are due for refill today.  yes  Requested medications are on the active medications list.  yes Last refill.unknown  Future visit scheduled.  no  Notes to clinic.  No provider signed last RX. Historical medication. Pleaseadvise.

## 2020-08-19 ENCOUNTER — Other Ambulatory Visit: Payer: Self-pay | Admitting: Nurse Practitioner

## 2020-08-19 NOTE — Telephone Encounter (Signed)
Patient's lab work was slightly abnormal when he was in the ER for his fall.  Patient will need to be seen and have lab work repeated before refilling this medication.

## 2020-08-19 NOTE — Telephone Encounter (Unsigned)
Copied from CRM [id number]. Topic: Quick Communication - Rx Refill/Question >> Aug 19, 2020  1:08 PM Mcneil, Ja-Kwan wrote: Medication: furosemide (LASIX) 40 MG tablet  Has the patient contacted their pharmacy? yes - Pt daughter in law stated refill was called in to pharmacy over a week ago but still has not been filled  Preferred Pharmacy (with phone number or street name): [id number] - [place], [place] - [address] Phone: [phone]   Fax: [phone]  Agent: Please be advised that RX refills may take up to 3 business days. We ask that you follow-up with your pharmacy.

## 2020-08-19 NOTE — Telephone Encounter (Signed)
  Notes to clinic:  Pt daughter in law stated refill was called in to pharmacy over a week ago but still has not been filled  Medication last filled by a historical provider  Review for refill    Requested Prescriptions  Pending Prescriptions Disp Refills   furosemide (LASIX) 40 MG tablet 30 tablet 0    Sig: Take 1 tablet (40 mg total) by mouth daily.      Cardiovascular:  Diuretics - Loop Failed - 08/19/2020  1:20 PM      Failed - K in normal range and within 360 days    Potassium  Date Value Ref Range Status  07/17/2020 3.4 (L) 3.5 - 5.1 mmol/L Final          Failed - Ca in normal range and within 360 days    Calcium  Date Value Ref Range Status  07/17/2020 8.3 (L) 8.9 - 10.3 mg/dL Final          Failed - Cr in normal range and within 360 days    Creatinine, Ser  Date Value Ref Range Status  07/17/2020 1.57 (H) 0.61 - 1.24 mg/dL Final          Passed - Na in normal range and within 360 days    Sodium  Date Value Ref Range Status  07/17/2020 136 135 - 145 mmol/L Final  05/18/2020 136 134 - 144 mmol/L Final          Passed - Last BP in normal range    BP Readings from Last 1 Encounters:  07/28/20 136/78          Passed - Valid encounter within last 6 months    Recent Outpatient Visits           3 weeks ago Visit for wound check   [facility] [place], Dorie Rank, NP   4 weeks ago Encounter for wound care   [facility] [place], Corrie Dandy T, NP   3 months ago AF (paroxysmal atrial fibrillation) (HCC)   [facility] [place], [place] T, NP   5 months ago Atrial fibrillation with rapid ventricular response [facility])   [facility] Particia Nearing, [place]   5 months ago Erroneous encounter - disregard   [facility] Particia Nearing, [place]

## 2020-08-19 NOTE — Telephone Encounter (Signed)
See message below from Clydie Braun

## 2020-08-19 NOTE — Telephone Encounter (Signed)
Lvm to make apt.  

## 2020-08-20 ENCOUNTER — Other Ambulatory Visit: Payer: Self-pay | Admitting: Family Medicine

## 2020-08-23 NOTE — Telephone Encounter (Signed)
Lvm to make apt.  

## 2020-08-25 ENCOUNTER — Encounter: Payer: Self-pay | Admitting: Nurse Practitioner

## 2020-08-25 NOTE — Telephone Encounter (Signed)
Lvm to make apt and sent letter

## 2020-08-30 NOTE — Progress Notes (Signed)
BP (!) 175/81   Pulse (!) 55   Temp 98 F (36.7 C)   Wt 165 lb (74.8 kg)   SpO2 98%   BMI 26.52 kg/m    Subjective:    Patient ID: Caleb Fleming., male    DOB: 1931/12/04, 87 y.o.   MRN: [id number]  HPI: Rafferty Postlewait is a 87 y.o. male  Chief Complaint  Patient presents with  . Hypertension  . Chronic Kidney Disease  . Atrial Fibrillation   HYPERTENSION / HYPERLIPIDEMIA Satisfied with current treatment? no Duration of hypertension: years BP monitoring frequency: not checking BP range:  BP medication side effects: no Past BP meds: Metoprolol Duration of hyperlipidemia: years Cholesterol medication side effects: yes Cholesterol supplements: none Past cholesterol medications: simvastatin (zocor) Medication compliance: excellent compliance Aspirin: ELIQUIS Recent stressors: no Recurrent headaches: no Visual changes: no Palpitations: no Dyspnea: no Chest pain: no Lower extremity edema: no Dizzy/lightheaded: no  AFIB Rate stable today.  Continues with medications.  Has not seen Cardiology since last visit.    ANXIETY AND DEPRESSION Patient denies SI/HI in office today.  Denies feeling down or anxious.  Patient is happy to get up out of bed everyday.   Relevant past medical, surgical, family and social history reviewed and updated as indicated. Interim medical history since our last visit reviewed. Allergies and medications reviewed and updated.  Review of Systems  Eyes: Negative for visual disturbance.  Respiratory: Negative for chest tightness and shortness of breath.   Cardiovascular: Negative for chest pain, palpitations and leg swelling.  Neurological: Negative for dizziness, light-headedness and headaches.  Psychiatric/Behavioral: Negative for dysphoric mood and suicidal ideas. The patient is not nervous/anxious.     Per HPI unless specifically indicated above     Objective:    BP (!) 175/81   Pulse (!) 55   Temp 98 F (36.7 C)    Wt 165 lb (74.8 kg)   SpO2 98%   BMI 26.52 kg/m   Wt Readings from Last 3 Encounters:  08/31/20 165 lb (74.8 kg)  07/28/20 164 lb (74.4 kg)  07/21/20 164 lb 3.2 oz (74.5 kg)    Physical Exam Vitals and nursing note reviewed.  Constitutional:      General: He is not in acute distress.    Appearance: Normal appearance. He is not ill-appearing, toxic-appearing or diaphoretic.  HENT:     Head: Normocephalic.     Right Ear: External ear normal.     Left Ear: External ear normal.     Nose: Nose normal. No congestion or rhinorrhea.     Mouth/Throat:     Mouth: Mucous membranes are moist.  Eyes:     General:        Right eye: No discharge.        Left eye: No discharge.     Extraocular Movements: Extraocular movements intact.     Conjunctiva/sclera: Conjunctivae normal.     Pupils: Pupils are equal, round, and reactive to light.  Cardiovascular:     Rate and Rhythm: Normal rate and regular rhythm.     Heart sounds: No murmur heard.   Pulmonary:     Effort: Pulmonary effort is normal. No respiratory distress.     Breath sounds: Normal breath sounds. No wheezing, rhonchi or rales.  Abdominal:     General: Abdomen is flat. Bowel sounds are normal.  Musculoskeletal:     Cervical back: Normal range of motion and neck supple.  Skin:  General: Skin is warm and dry.     Capillary Refill: Capillary refill takes less than 2 seconds.  Neurological:     General: No focal deficit present.     Mental Status: He is alert and oriented to person, place, and time.  Psychiatric:        Mood and Affect: Mood normal.        Behavior: Behavior normal.        Thought Content: Thought content normal.        Judgment: Judgment normal.     Results for orders placed or performed during the hospital encounter of 07/17/20  CBC with Differential  Result Value Ref Range   WBC 12.9 (H) 4.0 - 10.5 K/uL   RBC 3.87 (L) 4.22 - 5.81 MIL/uL   Hemoglobin 12.0 (L) 13.0 - 17.0 g/dL   HCT 74.1 (L) 28.7 -  52.0 %   MCV 93.3 80.0 - 100.0 fL   MCH 31.0 26.0 - 34.0 pg   MCHC 33.2 30.0 - 36.0 g/dL   RDW 86.7 67.2 - 09.4 %   Platelets 274 150 - 400 K/uL   nRBC 0.0 0.0 - 0.2 %   Neutrophils Relative % 68 %   Neutro Abs 8.8 (H) 1.7 - 7.7 K/uL   Lymphocytes Relative 16 %   Lymphs Abs 2.1 0.7 - 4.0 K/uL   Monocytes Relative 11 %   Monocytes Absolute 1.4 (H) 0.1 - 1.0 K/uL   Eosinophils Relative 3 %   Eosinophils Absolute 0.4 0.0 - 0.5 K/uL   Basophils Relative 1 %   Basophils Absolute 0.1 0.0 - 0.1 K/uL   Immature Granulocytes 1 %   Abs Immature Granulocytes 0.10 (H) 0.00 - 0.07 K/uL  Basic metabolic panel  Result Value Ref Range   Sodium 136 135 - 145 mmol/L   Potassium 3.4 (L) 3.5 - 5.1 mmol/L   Chloride 100 98 - 111 mmol/L   CO2 24 22 - 32 mmol/L   Glucose, Bld 97 70 - 99 mg/dL   BUN 23 8 - 23 mg/dL   Creatinine, Ser 7.09 (H) 0.61 - 1.24 mg/dL   Calcium 8.3 (L) 8.9 - 10.3 mg/dL   GFR, Estimated 43 (L) >60 mL/min   Anion gap 12 5 - 15      Assessment & Plan:   Problem List Items Addressed This Visit      Cardiovascular and Mediastinum   Hypertension    Chronic, ongoing with BP at elevated at visit tday.  Begin Hydralazine BID.  Side effects and benefits of medication discussed at visit today.  Recommend he monitor BP at home daily and document for providers + focus on DASH diet.  Minimize alcohol use.  Check CMP today.  Return in 1 months.      Relevant Medications   hydrALAZINE (APRESOLINE) 10 MG tablet   Other Relevant Orders   Comprehensive metabolic panel   AF (paroxysmal atrial fibrillation) (HCC) - Primary    Chronic, ongoing with rate-controlled at this time.  Continue current medication regimen and adjust as needed.  Recommended he return to cardiology for follow-up.  Patient declines going to Cardiology as they had requested.  Labs today.        Relevant Medications   hydrALAZINE (APRESOLINE) 10 MG tablet   Other Relevant Orders   Ambulatory referral to Cardiology    CBC w/Diff   HFrEF (heart failure with reduced ejection fraction) (HCC)    Noted on 12/08/2018 with EF 35-40%.  No repeat echo noted.  Euvolemic at this time.  He was to follow-up with cardiology in September 2020, have highly recommend he do this.  At this time continue current medication regimen and adjust as needed.  Patient declines repeat ECHO. CMP today.  Recommend: - Reminded to call for an overnight weight gain of >2 pounds or a weekly weight weight of >5 pounds - not adding salt to his food and has been reading food labels. Reviewed the importance of keeping daily sodium intake to 2000mg  daily       Relevant Medications   hydrALAZINE (APRESOLINE) 10 MG tablet   Other Relevant Orders   Ambulatory referral to Cardiology   CBC w/Diff     Genitourinary   CKD (chronic kidney disease), stage III (HCC)    Chronic, ongoing.  Continue current medication regimen and adjust as needed.  Patient declines referral to Nephrology.  Recommend he monitor BP at home daily and bring to next visit.  CMP today.        Relevant Orders   Comprehensive metabolic panel     Other   Hyperlipidemia    Chronic, ongoing.  Continue current medication regimen and adjust as needed.  Refills sent in.  Lipid panel today.         Relevant Medications   hydrALAZINE (APRESOLINE) 10 MG tablet   Other Relevant Orders   Lipid Panel Piccolo, Waived   Anxiety and depression    Stable off medication, continue to monitor.          Follow up plan: Return in about 1 month (around 09/28/2020) for BP Check.

## 2020-08-31 ENCOUNTER — Encounter: Payer: Self-pay | Admitting: Nurse Practitioner

## 2020-08-31 ENCOUNTER — Ambulatory Visit (INDEPENDENT_AMBULATORY_CARE_PROVIDER_SITE_OTHER): Payer: Medicare Other | Admitting: Nurse Practitioner

## 2020-08-31 ENCOUNTER — Other Ambulatory Visit: Payer: Self-pay

## 2020-08-31 VITALS — BP 175/81 | HR 55 | Temp 98.0°F | Wt 165.0 lb

## 2020-08-31 DIAGNOSIS — E782 Mixed hyperlipidemia: Secondary | ICD-10-CM | POA: Diagnosis not present

## 2020-08-31 DIAGNOSIS — I5020 Unspecified systolic (congestive) heart failure: Secondary | ICD-10-CM

## 2020-08-31 DIAGNOSIS — N1832 Chronic kidney disease, stage 3b: Secondary | ICD-10-CM | POA: Diagnosis not present

## 2020-08-31 DIAGNOSIS — I480 Paroxysmal atrial fibrillation: Secondary | ICD-10-CM | POA: Diagnosis not present

## 2020-08-31 DIAGNOSIS — I10 Essential (primary) hypertension: Secondary | ICD-10-CM | POA: Diagnosis not present

## 2020-08-31 DIAGNOSIS — F32A Depression, unspecified: Secondary | ICD-10-CM

## 2020-08-31 DIAGNOSIS — F419 Anxiety disorder, unspecified: Secondary | ICD-10-CM

## 2020-08-31 MED ORDER — HYDRALAZINE HCL 10 MG PO TABS: 10.0000 mg | ORAL_TABLET | Freq: Two times a day (BID) | ORAL | 1 refills | Status: DC

## 2020-08-31 NOTE — Assessment & Plan Note (Addendum)
Chronic, ongoing with rate-controlled at this time.  Continue current medication regimen and adjust as needed.  Recommended he return to cardiology for follow-up.  Patient declines going to Cardiology as they had requested.  Labs today.

## 2020-08-31 NOTE — Assessment & Plan Note (Addendum)
Chronic, ongoing.  Continue current medication regimen and adjust as needed.  Patient declines referral to Nephrology.  Recommend he monitor BP at home daily and bring to next visit.  CMP today.

## 2020-08-31 NOTE — Assessment & Plan Note (Addendum)
Chronic, ongoing with BP at elevated at visit tday.  Begin Hydralazine BID.  Side effects and benefits of medication discussed at visit today.  Recommend he monitor BP at home daily and document for providers + focus on DASH diet.  Minimize alcohol use.  Check CMP today.  Return in 1 months.

## 2020-08-31 NOTE — Assessment & Plan Note (Addendum)
Noted on 12/08/2018 with EF 35-40%.  No repeat echo noted.  Euvolemic at this time.  He was to follow-up with cardiology in September 2020, have highly recommend he do this.  At this time continue current medication regimen and adjust as needed.  Patient declines repeat ECHO. CMP today.  Recommend: - Reminded to call for an overnight weight gain of >2 pounds or a weekly weight weight of >5 pounds - not adding salt to his food and has been reading food labels. Reviewed the importance of keeping daily sodium intake to 2000mg  daily

## 2020-08-31 NOTE — Assessment & Plan Note (Signed)
Stable off medication, continue to monitor.

## 2020-08-31 NOTE — Assessment & Plan Note (Signed)
Chronic, ongoing.  Continue current medication regimen and adjust as needed.  Refills sent in.  Lipid panel today.    

## 2020-09-01 LAB — COMPREHENSIVE METABOLIC PANEL
ALT: 20 IU/L (ref 0–44)
AST: 21 IU/L (ref 0–40)
Albumin/Globulin Ratio: 2.1 (ref 1.2–2.2)
Albumin: 4.1 g/dL (ref 3.6–4.6)
Alkaline Phosphatase: 128 IU/L — ABNORMAL HIGH (ref 44–121)
BUN/Creatinine Ratio: 13 (ref 10–24)
BUN: 17 mg/dL (ref 8–27)
Bilirubin Total: 0.3 mg/dL (ref 0.0–1.2)
CO2: 19 mmol/L — ABNORMAL LOW (ref 20–29)
Calcium: 8.9 mg/dL (ref 8.6–10.2)
Chloride: 103 mmol/L (ref 96–106)
Creatinine, Ser: 1.29 mg/dL — ABNORMAL HIGH (ref 0.76–1.27)
GFR calc Af Amer: 58 mL/min/{1.73_m2} — ABNORMAL LOW (ref >59)
GFR calc non Af Amer: 50 mL/min/{1.73_m2} — ABNORMAL LOW (ref >59)
Globulin, Total: 2 g/dL (ref 1.5–4.5)
Glucose: 82 mg/dL (ref 65–99)
Potassium: 4.1 mmol/L (ref 3.5–5.2)
Sodium: 139 mmol/L (ref 134–144)
Total Protein: 6.1 g/dL (ref 6.0–8.5)

## 2020-09-01 LAB — CBC WITH DIFFERENTIAL/PLATELET
Basophils Absolute: 0.1 10*3/uL (ref 0.0–0.2)
Basos: 1 %
EOS (ABSOLUTE): 0.4 10*3/uL (ref 0.0–0.4)
Eos: 4 %
Hematocrit: 36.6 % — ABNORMAL LOW (ref 37.5–51.0)
Hemoglobin: 11.9 g/dL — ABNORMAL LOW (ref 13.0–17.7)
Immature Grans (Abs): 0.1 10*3/uL (ref 0.0–0.1)
Immature Granulocytes: 1 %
Lymphocytes Absolute: 1.6 10*3/uL (ref 0.7–3.1)
Lymphs: 17 %
MCH: 29.6 pg (ref 26.6–33.0)
MCHC: 32.5 g/dL (ref 31.5–35.7)
MCV: 91 fL (ref 79–97)
Monocytes Absolute: 1 10*3/uL — ABNORMAL HIGH (ref 0.1–0.9)
Monocytes: 10 %
Neutrophils Absolute: 6.5 10*3/uL (ref 1.4–7.0)
Neutrophils: 67 %
Platelets: 281 10*3/uL (ref 150–450)
RBC: 4.02 x10E6/uL — ABNORMAL LOW (ref 4.14–5.80)
RDW: 12.8 % (ref 11.6–15.4)
WBC: 9.6 10*3/uL (ref 3.4–10.8)

## 2020-09-01 NOTE — Progress Notes (Signed)
Please let Mr. Caleb Fleming know that his lab work is consistent with prior.  He remains slightly anemia but stable.  His kidney function has improved a little bit, likely because he is no longer taking the lasix.  I will see him in one month for his blood pressure check.

## 2020-09-14 NOTE — Telephone Encounter (Signed)
Patient has been rescheduled.

## 2020-09-28 NOTE — Progress Notes (Signed)
BP (!) 158/86   Pulse (!) 50   Temp 98.3 F (36.8 C)   Wt 160 lb (72.6 kg)   SpO2 97%   BMI 25.71 kg/m    Subjective:    Patient ID: Caleb Fleming., male    DOB: 13-Nov-1931, 87 y.o.   MRN: [id number]  HPI: Nicholson Starace is a 87 y.o. male  Chief Complaint  Patient presents with  . Hypertension   HYPERTENSION / HYPERLIPIDEMIA Satisfied with current treatment? yes Duration of hypertension: years BP monitoring frequency: not checking BP range:  BP medication side effects: no Past BP meds: Metoprolol Duration of hyperlipidemia: years Cholesterol medication side effects: yes Cholesterol supplements: none Past cholesterol medications: simvastatin (zocor) Medication compliance: excellent compliance Aspirin: ELIQUIS Recent stressors: no Recurrent headaches: no Visual changes: no Palpitations: no Dyspnea: no Chest pain: no Lower extremity edema: no Dizzy/lightheaded: no Relevant past medical, surgical, family and social history reviewed and updated as indicated. Interim medical history since our last visit reviewed. Allergies and medications reviewed and updated.  Review of Systems  Eyes: Negative for visual disturbance.  Respiratory: Negative for chest tightness and shortness of breath.   Cardiovascular: Negative for chest pain, palpitations and leg swelling.  Neurological: Negative for dizziness, light-headedness and headaches.    Per HPI unless specifically indicated above     Objective:    BP (!) 158/86   Pulse (!) 50   Temp 98.3 F (36.8 C)   Wt 160 lb (72.6 kg)   SpO2 97%   BMI 25.71 kg/m   Wt Readings from Last 3 Encounters:  09/29/20 160 lb (72.6 kg)  08/31/20 165 lb (74.8 kg)  07/28/20 164 lb (74.4 kg)    Physical Exam Vitals and nursing note reviewed.  Constitutional:      General: He is not in acute distress.    Appearance: Normal appearance. He is not ill-appearing, toxic-appearing or diaphoretic.  HENT:     Head:  Normocephalic.     Right Ear: External ear normal.     Left Ear: External ear normal.     Nose: Nose normal. No congestion or rhinorrhea.     Mouth/Throat:     Mouth: Mucous membranes are moist.  Eyes:     General:        Right eye: No discharge.        Left eye: No discharge.     Extraocular Movements: Extraocular movements intact.     Conjunctiva/sclera: Conjunctivae normal.     Pupils: Pupils are equal, round, and reactive to light.  Cardiovascular:     Rate and Rhythm: Normal rate and regular rhythm.     Heart sounds: No murmur heard.   Pulmonary:     Effort: Pulmonary effort is normal. No respiratory distress.     Breath sounds: Normal breath sounds. No wheezing, rhonchi or rales.  Abdominal:     General: Abdomen is flat. Bowel sounds are normal.  Musculoskeletal:     Cervical back: Normal range of motion and neck supple.  Skin:    General: Skin is warm and dry.     Capillary Refill: Capillary refill takes less than 2 seconds.  Neurological:     General: No focal deficit present.     Mental Status: He is alert and oriented to person, place, and time.  Psychiatric:        Mood and Affect: Mood normal.        Behavior: Behavior normal.  Thought Content: Thought content normal.        Judgment: Judgment normal.     Results for orders placed or performed in visit on 08/31/20  CBC w/Diff  Result Value Ref Range   WBC 9.6 3.4 - 10.8 x10E3/uL   RBC 4.02 (L) 4.14 - 5.80 x10E6/uL   Hemoglobin 11.9 (L) 13.0 - 17.7 g/dL   Hematocrit 98.1 (L) 19.1 - 51.0 %   MCV 91 79 - 97 fL   MCH 29.6 26.6 - 33.0 pg   MCHC 32.5 31.5 - 35.7 g/dL   RDW 47.8 29.5 - 62.1 %   Platelets 281 150 - 450 x10E3/uL   Neutrophils 67 Not Estab. %   Lymphs 17 Not Estab. %   Monocytes 10 Not Estab. %   Eos 4 Not Estab. %   Basos 1 Not Estab. %   Neutrophils Absolute 6.5 1.4 - 7.0 x10E3/uL   Lymphocytes Absolute 1.6 0.7 - 3.1 x10E3/uL   Monocytes Absolute 1.0 (H) 0.1 - 0.9 x10E3/uL   EOS  (ABSOLUTE) 0.4 0.0 - 0.4 x10E3/uL   Basophils Absolute 0.1 0.0 - 0.2 x10E3/uL   Immature Granulocytes 1 Not Estab. %   Immature Grans (Abs) 0.1 0.0 - 0.1 x10E3/uL  Comprehensive metabolic panel  Result Value Ref Range   Glucose 82 65 - 99 mg/dL   BUN 17 8 - 27 mg/dL   Creatinine, Ser 3.08 (H) 0.76 - 1.27 mg/dL   GFR calc non Af Amer 50 (L) >59 mL/min/1.73   GFR calc Af Amer 58 (L) >59 mL/min/1.73   BUN/Creatinine Ratio 13 10 - 24   Sodium 139 134 - 144 mmol/L   Potassium 4.1 3.5 - 5.2 mmol/L   Chloride 103 96 - 106 mmol/L   CO2 19 (L) 20 - 29 mmol/L   Calcium 8.9 8.6 - 10.2 mg/dL   Total Protein 6.1 6.0 - 8.5 g/dL   Albumin 4.1 3.6 - 4.6 g/dL   Globulin, Total 2.0 1.5 - 4.5 g/dL   Albumin/Globulin Ratio 2.1 1.2 - 2.2   Bilirubin Total 0.3 0.0 - 1.2 mg/dL   Alkaline Phosphatase 128 (H) 44 - 121 IU/L   AST 21 0 - 40 IU/L   ALT 20 0 - 44 IU/L      Assessment & Plan:   Problem List Items Addressed This Visit      Cardiovascular and Mediastinum   Hypertension - Primary    Chronic, under better control at visit today.  Continue with current medication regimen. Patient does not want to see Cardiology for assistance with blood pressure control.  Recommend he monitor BP at home daily and document for providers + focus on DASH diet.  Minimize alcohol use.  Check CMP today.  Return in 2 months.          Follow up plan: Return in about 2 months (around 11/29/2020) for HTN, HLD, DM2 FU.  A total of 20 minutes were spent on this encounter today.  When total time is documented, this includes both the face-to-face and non-face-to-face time personally spent before, during and after the visit on the date of the encounter.

## 2020-09-28 NOTE — Assessment & Plan Note (Addendum)
Chronic, under better control at visit today.  Continue with current medication regimen. Patient does not want to see Cardiology for assistance with blood pressure control.  Recommend he monitor BP at home daily and document for providers + focus on DASH diet.  Minimize alcohol use.  Check CMP today.  Return in 2 months.

## 2020-09-29 ENCOUNTER — Other Ambulatory Visit: Payer: Self-pay

## 2020-09-29 ENCOUNTER — Encounter: Payer: Self-pay | Admitting: Nurse Practitioner

## 2020-09-29 ENCOUNTER — Ambulatory Visit (INDEPENDENT_AMBULATORY_CARE_PROVIDER_SITE_OTHER): Payer: Medicare Other | Admitting: Nurse Practitioner

## 2020-09-29 VITALS — BP 158/86 | HR 50 | Temp 98.3°F | Wt 160.0 lb

## 2020-09-29 DIAGNOSIS — I10 Essential (primary) hypertension: Secondary | ICD-10-CM | POA: Diagnosis not present

## 2020-10-04 DIAGNOSIS — I700 Atherosclerosis of aorta: Secondary | ICD-10-CM | POA: Insufficient documentation

## 2020-10-26 ENCOUNTER — Ambulatory Visit (INDEPENDENT_AMBULATORY_CARE_PROVIDER_SITE_OTHER): Payer: Medicare Other | Admitting: General Practice

## 2020-10-26 ENCOUNTER — Telehealth: Payer: Medicare Other | Admitting: General Practice

## 2020-10-26 DIAGNOSIS — I10 Essential (primary) hypertension: Secondary | ICD-10-CM | POA: Diagnosis not present

## 2020-10-26 DIAGNOSIS — I5020 Unspecified systolic (congestive) heart failure: Secondary | ICD-10-CM | POA: Diagnosis not present

## 2020-10-26 DIAGNOSIS — I480 Paroxysmal atrial fibrillation: Secondary | ICD-10-CM | POA: Diagnosis not present

## 2020-10-26 NOTE — Chronic Care Management (AMB) (Signed)
Chronic Care Management   CCM RN Visit Note  10/26/2020 Name: Caleb Fleming. MRN: [id number] DOB: 1931/12/25  Subjective: Caleb Fleming. is a 87 y.o. year old male who is a primary care patient of Jon Billings, NP. The care management team was consulted for assistance with disease management and care coordination needs.    Engaged with patient by telephone for follow up visit in response to provider referral for case management and/or care coordination services.   Consent to Services:  The patient was given information about Chronic Care Management services, agreed to services, and gave verbal consent prior to initiation of services.  Please see initial visit note for detailed documentation.   Patient agreed to services and verbal consent obtained.   Assessment: Review of patient past medical history, allergies, medications, health status, including review of consultants reports, laboratory and other test data, was performed as part of comprehensive evaluation and provision of chronic care management services.   SDOH (Social Determinants of Health) assessments and interventions performed:    CCM Care Plan  Allergies  Allergen Reactions  . Bactrim [Sulfamethoxazole-Trimethoprim] Itching  . Codeine     Outpatient Encounter Medications as of 10/26/2020  Medication Sig  . acetaminophen (TYLENOL) 325 MG tablet Take 650 mg by mouth every 4 (four) hours as needed.  Marland Kitchen allopurinol (ZYLOPRIM) 100 MG tablet Take 1 tablet (100 mg total) by mouth daily.  Marland Kitchen amiodarone (PACERONE) 200 MG tablet Take 1 tablet (200 mg total) by mouth daily.  Marland Kitchen ELIQUIS 5 MG TABS tablet TAKE 1 TABLET BY MOUTH TWICE A DAY  . hydrALAZINE (APRESOLINE) 10 MG tablet Take 1 tablet (10 mg total) by mouth 2 (two) times daily.  . metoprolol succinate (TOPROL-XL) 25 MG 24 hr tablet Take 1 tablet (25 mg total) by mouth daily.  . potassium chloride (KLOR-CON) 10 MEQ tablet TAKE 1 TABLET (10 MEQ TOTAL) BY MOUTH  2 (TWO) TIMES DAILY.  . simvastatin (ZOCOR) 20 MG tablet Take 1 tablet (20 mg total) by mouth daily at 6 PM.  . zinc sulfate 220 (50 Zn) MG capsule Take 1 capsule (220 mg total) by mouth daily.   No facility-administered encounter medications on file as of 10/26/2020.    Patient Active Problem List   Diagnosis Date Noted  . Aortic atherosclerosis ([place]) 10/04/2020  . Laceration of scalp 07/21/2020  . HFrEF (heart failure with reduced ejection fraction) ([place]) 05/18/2020  . CKD (chronic kidney disease), stage III ([place]) 05/18/2020  . Senile purpura ([place]) 05/18/2020  . AF (paroxysmal atrial fibrillation) ([place]) 02/11/2020  . History of COVID-19 02/11/2020  . Alcohol abuse 12/25/2018  . Benign prostatic hyperplasia 05/26/2015  . Gout 05/26/2015  . Hyperlipidemia 05/26/2015  . Insomnia 05/26/2015  . Hypogonadism in male 05/26/2015  . Hypertension 05/26/2015  . Anxiety and depression 05/26/2015    Conditions to be addressed/monitored:Atrial Fibrillation, CHF and HTN  Care Plan : RNCM: Heart Failure (Adult)  Updates made by Vanita Ingles since 10/26/2020 12:00 AM    Problem: RNCM: Symptom Exacerbation (Heart Failure)   Priority: Medium    Long-Range Goal: RNCM: Symptom Exacerbation Prevented or Minimized   Priority: Medium  Note:   Current Barriers:  Marland Kitchen Knowledge deficits related to basic heart failure pathophysiology and self care management . Lacks social connections . Does not maintain contact with provider office . Does not contact provider office for questions/concerns . Lack of scale in home . Financial strain Nurse Case Manager Clinical Goal(s):   patient  will weigh self daily and record  patient will verbalize understanding of Heart Failure Action Plan and when to call doctor  patient will take all Heart Failure mediations as prescribed Interventions:  . Collaboration with Jon Billings, NP regarding development and update of comprehensive plan of care as evidenced  by provider attestation and co-signature . Inter-disciplinary care team collaboration (see longitudinal plan of care) . Basic overview and discussion of pathophysiology of Heart Failure . Provided written and verbal education on low sodium diet . Reviewed Heart Failure Action Plan in depth and provided written copy . Assessed for scales in home . Discussed importance of daily weight . Reviewed role of diuretics in prevention of fluid overload Self-Care Activities:  Takes Heart Failure Medications as prescribed Weighs daily and record (notifying MD of 3 lb weight gain over night or 5 lb in a week) Verbalizes understanding of and follows CHF Action Plan Adheres to low sodium diet  Patient Goals:  - Take Heart Failure Medications as prescribed - Weigh daily and record (notify MD with 3 lb weight gain over night or 5 lb in a week) - Follow CHF Action Plan - Adhere to low sodium diet - eat more whole grains, fruits and vegetables, lean meats and healthy fats - follow rescue plan if symptoms flare-up - know when to call the doctor - track symptoms and what helps feel better or worse - dress right for the weather, hot or cold - avoid heavy exercise on very hot days - drink water to stay hydrated during exercise - follow activity or exercise plan - make an activity or exercise plan - pace activity allowing for rest - warm up and cool down for 10 minutes before and after exercise - call office if I gain more than 2 pounds in one day or 5 pounds in one week - keep legs up while sitting - use salt in moderation - watch for swelling in feet, ankles and legs every day - weigh myself daily barriers to lifestyle changes reviewed and addressed - barriers to treatment reviewed and addressed - cognitive screening completed and reviewed - depression screen reviewed - health literacy screening completed or reviewed - healthy lifestyle promoted - medication-adherence assessment completed - rescue  (action) plan developed - rescue (action) plan reviewed - self-awareness of signs/symptoms of worsening disease encouraged    Notes: lives with son and daughter in law who help manage his care Follow Up Plan: Telephone follow up appointment with care management team member scheduled for: 12-28-2020 at 1145 am   Task: RNCM: Identify and Minimize Risk of Heart Failure Exacerbation   Note:   Care Management Activities:    - barriers to lifestyle changes reviewed and addressed - barriers to treatment reviewed and addressed - cognitive screening completed and reviewed - depression screen reviewed - health literacy screening completed or reviewed - healthy lifestyle promoted - medication-adherence assessment completed - rescue (action) plan developed - rescue (action) plan reviewed - self-awareness of signs/symptoms of worsening disease encouraged    Notes: lives with son and daughter in law who help manage his care   Care Plan : RNCM: Hypertension (Adult)  Updates made by Vanita Ingles since 10/26/2020 12:00 AM    Problem: RNCM: Hypertension (Hypertension)   Priority: Medium    Long-Range Goal: RNCM: Hypertension Monitored   Priority: Medium  Note:   Objective:  . Last practice recorded BP readings:  BP Readings from Last 3 Encounters:  09/29/20 (!) 158/86  08/31/20 (!) 175/81  07/28/20 136/78 .   Marland Kitchen Most recent eGFR/CrCl: No results found for: EGFR  No components found for: CRCL Current Barriers:  Marland Kitchen Knowledge Deficits related to basic understanding of hypertension pathophysiology and self care management . Knowledge Deficits related to understanding of medications prescribed for management of hypertension . Limited Social Support . Unable to independently manage HTN . Unable to self administer medications as prescribed . Lacks social connections . Does not maintain contact with provider office . Does not contact provider office for questions/concerns Case Manager Clinical  Goal(s):  . patient will verbalize understanding of plan for hypertension management . patient will attend all scheduled medical appointments: 11-28-2020 at 10 am . patient will demonstrate improved adherence to prescribed treatment plan for hypertension as evidenced by taking all medications as prescribed, monitoring and recording blood pressure as directed, adhering to low sodium/DASH diet . patient will demonstrate improved health management independence as evidenced by checking blood pressure as directed and notifying PCP if SBP>160 or DBP > 90, taking all medications as prescribe, and adhering to a low sodium diet as discussed. . patient will verbalize basic understanding of hypertension disease process and self health management plan as evidenced by compliance with medications, compliance with heart healthy diet, and working with CCM team for management of health and well being.  Interventions:  . Collaboration with Jon Billings, NP regarding development and update of comprehensive plan of care as evidenced by provider attestation and co-signature . Inter-disciplinary care team collaboration (see longitudinal plan of care) . Evaluation of current treatment plan related to hypertension self management and patient's adherence to plan as established by provider. . Provided education to patient re: stroke prevention, s/s of heart attack and stroke, DASH diet, complications of uncontrolled blood pressure . Reviewed medications with patient and discussed importance of compliance . Discussed plans with patient for ongoing care management follow up and provided patient with direct contact information for care management team . Advised patient, providing education and rationale, to monitor blood pressure daily and record, calling PCP for findings outside established parameters.  . Reviewed scheduled/upcoming provider appointments including: [phone] at 10 am Self-Care Activities: - Self administers  medications as prescribed Attends all scheduled provider appointments Calls provider office for new concerns, questions, or BP outside discussed parameters Checks BP and records as discussed Follows a low sodium diet/DASH diet Patient Goals: - check blood pressure weekly - choose a place to take my blood pressure (home, clinic or office, retail store) - write blood pressure results in a log or diary - agree on reward when goals are met - agree to work together to make changes - ask questions to understand - have a family meeting to talk about healthy habits - learn about high blood pressure - blood pressure trends reviewed - depression screen reviewed - home or ambulatory blood pressure monitoring encouraged  Follow Up Plan: Telephone follow up appointment with care management team member scheduled for: 12-28-2020 at 11:45 am   Task: RNCM: Identify and Monitor Blood Pressure Elevation   Note:   Care Management Activities:    - blood pressure trends reviewed - depression screen reviewed - home or ambulatory blood pressure monitoring encouraged       Care Plan : RNCM: Atrial Fibrillation (Adult)  Updates made by Vanita Ingles since 10/26/2020 12:00 AM    Problem: RNCM: Dysrhythmia (Atrial Fibrillation)   Priority: Medium    Long-Range Goal: RNCM: Heart Rate and Rhythm Monitored and Managed/AFIB   Priority: Medium  Note:   Current Barriers:  Marland Kitchen Knowledge deficits related to self health management of chronic afib . Chronic Disease Management support and education needs related to effective management of AFIB . Lacks caregiver support.  . Unable to independently manage AFIB . Lacks social connections . Does not maintain contact with provider office . Does not contact provider office for questions/concerns Nurse Case Manager Clinical Goal(s):   patient will take all medications exactly as prescribed and will call provider for medication related questions  patient will  verbalize understanding of Afib Action Plan and when to call doctor  patient will verbalize understanding of plan for effective management of AFIB  patient will work with [facility] and pcp to address needs related to AFIB management   patient will attend all scheduled medical appointments: 11-28-2020 at 10 am  patient will demonstrate improved adherence to prescribed treatment plan for AFIB  as evidenced byrate controlled, compliance with medications, and working with CCM team to optimize health and well being. Interventions:  . Collaboration with Jon Billings, NP regarding development and update of comprehensive plan of care as evidenced by provider attestation and co-signature . Inter-disciplinary care team collaboration (see longitudinal plan of care) . Basic overview and discussion of afib disease state . Medications reviewed . Afib action plan reviewed . Evaluation of current treatment plan related to Afib and patient's adherence to plan as established by provider. . Advised patient to call the office for changes in conditions or questions  . Provided education to patient re: sx /sx of uncontrolled heart rate, to monitor for changes in conditions, calling the office for changes in breathing or other issues impacting care of chronic conditions  . Reviewed scheduled/upcoming provider appointments including:  . Discussed plans with patient for ongoing care management follow up and provided patient with direct contact information for care management team Self-Care Activities: Patient verbalizes understanding of plan to effective management of AFIB Self administers medications as prescribed Attends all scheduled provider appointments Calls pharmacy for medication refills Attends church or other social activities Performs ADL's independently Performs IADL's independently Calls provider office for new concerns or questions Patient Goals: - check pulse (heart) rate once a day - cut down  alcohol use - make a plan to eat healthy - keep all lab appointments - stop my alcohol use - take medicine as prescribed - activity or exercise based on tolerance encouraged - barriers to lifestyle changes reviewed and addressed - reassurance provided - rescue (action) plan developed - response to pharmacologic therapy monitored - screen for functional limitations reviewed - rescue (action) plan use encouraged Follow Up Plan: Telephone follow up appointment with care management team member scheduled for: 12-28-2020 at 1145   Task: RNCM: Alleviate Barriers to Dysrhythmia Management   Note:   Care Management Activities:    - activity or exercise based on tolerance encouraged - barriers to lifestyle changes reviewed and addressed - reassurance provided - rescue (action) plan developed - response to pharmacologic therapy monitored - screen for functional limitations reviewed - rescue (action) plan use encouraged         Plan:Telephone follow up appointment with care management team member scheduled for:  12-28-2020 at 11:45 am  Noreene Larsson RN, MSN, [facility] Mobile: [phone]

## 2020-10-26 NOTE — Patient Instructions (Signed)
Visit Information  PATIENT GOALS: Goals Addressed              This Visit's Progress   .  COMPLETED: RNCM: Pt-"He does not have a way of checking his blood pressure, we still can not afford 2 of his medications" (pt-stated)        CARE PLAN ENTRY (see longtitudinal plan of care for additional care plan information) Closing this goal and opening in new ELS Current Barriers:  . Chronic Disease Management support, education, and care coordination needs related to Atrial Fibrillation, HTN, and HLD . Financial Constraints  Clinical Goal(s) related to Atrial Fibrillation, HTN, and HLD:  Over the next 90 days, patient will:  . Work with the care management team to address educational, disease management, and care coordination needs  . Begin or continue self health monitoring activities as directed today Measure and record blood pressure 4 times per week and take medications as prescribed  . Call provider office for new or worsened signs and symptoms Blood pressure findings outside established parameters and New or worsened symptom related to HLD and AFIB . Call care management team with questions or concerns . Verbalize basic understanding of patient centered plan of care established today . Over the next 90 days, Daughter in law will call [facility] and talk to customer service about the over the counter benefit and life alert system for the patient to get blood pressure cuff and system to help with safety  Interventions related to Atrial Fibrillation, HTN, and HLD:  . Evaluation of current treatment plans and patient's adherence to plan as established by provider- per the daughter in law the patient is still not taking the Metoprolol and Zocor due to affordability One is >$100 and the other is $47.00. 03-11-2020: The patients DIL states she went to pick up his Eliquis and they told her it would cost >300.00. They had filled the script for 90 days instead of 30. It was 47.00 with just a 30 day supply.  The DIL will talk to the pharmacist next week about other options.  . Assessed patient understanding of disease states- daughter in law helps the patient manage his care.  03-11-2020: The patient's DIL states that the patient is doing well since getting out of the hospital. He has had no further issues related to [place].  The patient was taking a nap at the time of the call. . Assessed patient's education and care coordination needs- denies the need for social work at this time but would like to talk to the pharmacist to assist with cost and resources to help pay for medications. 03-11-2020: Currently working with the pharmacist to get medications at an affordable price.  . Provided disease specific education to patient: Heart Healthy diet options, taking medications as prescribed, education and support on checking and recording blood pressure daily for optimal management of HTN.  03-11-2020: The patient per the daughter is eating well and adhering to dietary restrictions. The patient is doing well and no new complaints at this time.  Nash Dimmer with appropriate clinical care team members regarding patient needs. 03-11-2020: Currently working with the pharm D. Denies any needs from the LCSW but knows they are available as needed.  . Educated the daughter in law to call  [facility] customer service number on the back of the patients insurance card and request the OTC catalog to be sent and information on obtaining the [employer] system free of charge . Sent EMAIL to [email or web address] so  the daughter in law can sign up for myChart and see lab work, communicate with the healthcare team and educational information- 02-10-2020: Has not signed up yet. Will discuss further as today's call was an urgent call for decline in condition.   Patient Self Care Activities related to Atrial Fibrillation, HTN, and HLD:  . Patient is unable to independently self-manage chronic health conditions  Please see past updates  related to this goal by clicking on the "Past Updates" button in the selected goal      .  RNCM: Track and Manage My Blood Pressure-Hypertension        Timeframe:  Long-Range Goal Priority:  High Start Date:                             Expected End Date:     04-27-2021                  Follow Up Date 12-28-2020   - check blood pressure weekly - choose a place to take my blood pressure (home, clinic or office, retail store) - write blood pressure results in a log or diary    Why is this important?    You won't feel high blood pressure, but it can still hurt your blood vessels.   High blood pressure can cause heart or kidney problems. It can also cause a stroke.   Making lifestyle changes like losing a little weight or eating less salt will help.   Checking your blood pressure at home and at different times of the day can help to control blood pressure.   If the doctor prescribes medicine remember to take it the way the doctor ordered.   Call the office if you cannot afford the medicine or if there are questions about it.     Notes: Daughter in law states they have a blood pressure cuff now. Encouraged her to write down the readings and take to appointments.       Patient Care Plan: RNCM: Heart Failure (Adult)    Problem Identified: RNCM: Symptom Exacerbation (Heart Failure)   Priority: Medium    Long-Range Goal: RNCM: Symptom Exacerbation Prevented or Minimized   Priority: Medium  Note:   Current Barriers:  Marland Kitchen Knowledge deficits related to basic heart failure pathophysiology and self care management . Lacks social connections . Does not maintain contact with provider office . Does not contact provider office for questions/concerns . Lack of scale in home . Financial strain Nurse Case Manager Clinical Goal(s):   patient will weigh self daily and record  patient will verbalize understanding of Heart Failure Action Plan and when to call doctor  patient will take all  Heart Failure mediations as prescribed Interventions:  . Collaboration with Jon Billings, NP regarding development and update of comprehensive plan of care as evidenced by provider attestation and co-signature . Inter-disciplinary care team collaboration (see longitudinal plan of care) . Basic overview and discussion of pathophysiology of Heart Failure . Provided written and verbal education on low sodium diet . Reviewed Heart Failure Action Plan in depth and provided written copy . Assessed for scales in home . Discussed importance of daily weight . Reviewed role of diuretics in prevention of fluid overload Self-Care Activities:  Takes Heart Failure Medications as prescribed Weighs daily and record (notifying MD of 3 lb weight gain over night or 5 lb in a week) Verbalizes understanding of and follows CHF Action Plan Adheres  to low sodium diet  Patient Goals:  - Take Heart Failure Medications as prescribed - Weigh daily and record (notify MD with 3 lb weight gain over night or 5 lb in a week) - Follow CHF Action Plan - Adhere to low sodium diet - eat more whole grains, fruits and vegetables, lean meats and healthy fats - follow rescue plan if symptoms flare-up - know when to call the doctor - track symptoms and what helps feel better or worse - dress right for the weather, hot or cold - avoid heavy exercise on very hot days - drink water to stay hydrated during exercise - follow activity or exercise plan - make an activity or exercise plan - pace activity allowing for rest - warm up and cool down for 10 minutes before and after exercise - call office if I gain more than 2 pounds in one day or 5 pounds in one week - keep legs up while sitting - use salt in moderation - watch for swelling in feet, ankles and legs every day - weigh myself daily barriers to lifestyle changes reviewed and addressed - barriers to treatment reviewed and addressed - cognitive screening completed  and reviewed - depression screen reviewed - health literacy screening completed or reviewed - healthy lifestyle promoted - medication-adherence assessment completed - rescue (action) plan developed - rescue (action) plan reviewed - self-awareness of signs/symptoms of worsening disease encouraged    Notes: lives with son and daughter in law who help manage his care Follow Up Plan: Telephone follow up appointment with care management team member scheduled for: 12-28-2020 at 1145 am   Task: RNCM: Identify and Minimize Risk of Heart Failure Exacerbation   Note:   Care Management Activities:    - barriers to lifestyle changes reviewed and addressed - barriers to treatment reviewed and addressed - cognitive screening completed and reviewed - depression screen reviewed - health literacy screening completed or reviewed - healthy lifestyle promoted - medication-adherence assessment completed - rescue (action) plan developed - rescue (action) plan reviewed - self-awareness of signs/symptoms of worsening disease encouraged    Notes: lives with son and daughter in law who help manage his care   Patient Care Plan: RNCM: Hypertension (Adult)    Problem Identified: RNCM: Hypertension (Hypertension)   Priority: Medium    Long-Range Goal: RNCM: Hypertension Monitored   Priority: Medium  Note:   Objective:  . Last practice recorded BP readings:  BP Readings from Last 3 Encounters:  09/29/20 (!) 158/86  08/31/20 (!) 175/81  07/28/20 136/78 .   Marland Kitchen Most recent eGFR/CrCl: No results found for: EGFR  No components found for: CRCL Current Barriers:  Marland Kitchen Knowledge Deficits related to basic understanding of hypertension pathophysiology and self care management . Knowledge Deficits related to understanding of medications prescribed for management of hypertension . Limited Social Support . Unable to independently manage HTN . Unable to self administer medications as prescribed . Lacks social  connections . Does not maintain contact with provider office . Does not contact provider office for questions/concerns Case Manager Clinical Goal(s):  . patient will verbalize understanding of plan for hypertension management . patient will attend all scheduled medical appointments: 11-28-2020 at 10 am . patient will demonstrate improved adherence to prescribed treatment plan for hypertension as evidenced by taking all medications as prescribed, monitoring and recording blood pressure as directed, adhering to low sodium/DASH diet . patient will demonstrate improved health management independence as evidenced by checking blood pressure as directed and notifying  PCP if SBP>160 or DBP > 90, taking all medications as prescribe, and adhering to a low sodium diet as discussed. . patient will verbalize basic understanding of hypertension disease process and self health management plan as evidenced by compliance with medications, compliance with heart healthy diet, and working with CCM team for management of health and well being.  Interventions:  . Collaboration with Jon Billings, NP regarding development and update of comprehensive plan of care as evidenced by provider attestation and co-signature . Inter-disciplinary care team collaboration (see longitudinal plan of care) . Evaluation of current treatment plan related to hypertension self management and patient's adherence to plan as established by provider. . Provided education to patient re: stroke prevention, s/s of heart attack and stroke, DASH diet, complications of uncontrolled blood pressure . Reviewed medications with patient and discussed importance of compliance . Discussed plans with patient for ongoing care management follow up and provided patient with direct contact information for care management team . Advised patient, providing education and rationale, to monitor blood pressure daily and record, calling PCP for findings outside  established parameters.  . Reviewed scheduled/upcoming provider appointments including: [phone] at 10 am Self-Care Activities: - Self administers medications as prescribed Attends all scheduled provider appointments Calls provider office for new concerns, questions, or BP outside discussed parameters Checks BP and records as discussed Follows a low sodium diet/DASH diet Patient Goals: - check blood pressure weekly - choose a place to take my blood pressure (home, clinic or office, retail store) - write blood pressure results in a log or diary - agree on reward when goals are met - agree to work together to make changes - ask questions to understand - have a family meeting to talk about healthy habits - learn about high blood pressure - blood pressure trends reviewed - depression screen reviewed - home or ambulatory blood pressure monitoring encouraged  Follow Up Plan: Telephone follow up appointment with care management team member scheduled for: 12-28-2020 at 11:45 am   Task: RNCM: Identify and Monitor Blood Pressure Elevation   Note:   Care Management Activities:    - blood pressure trends reviewed - depression screen reviewed - home or ambulatory blood pressure monitoring encouraged       Patient Care Plan: RNCM: Atrial Fibrillation (Adult)    Problem Identified: RNCM: Dysrhythmia (Atrial Fibrillation)   Priority: Medium    Long-Range Goal: RNCM: Heart Rate and Rhythm Monitored and Managed/AFIB   Priority: Medium  Note:   Current Barriers:  Marland Kitchen Knowledge deficits related to self health management of chronic afib . Chronic Disease Management support and education needs related to effective management of AFIB . Lacks caregiver support.  . Unable to independently manage AFIB . Lacks social connections . Does not maintain contact with provider office . Does not contact provider office for questions/concerns Nurse Case Manager Clinical Goal(s):   patient will take all  medications exactly as prescribed and will call provider for medication related questions  patient will verbalize understanding of Afib Action Plan and when to call doctor  patient will verbalize understanding of plan for effective management of AFIB  patient will work with [facility] and pcp to address needs related to AFIB management   patient will attend all scheduled medical appointments: 11-28-2020 at 10 am  patient will demonstrate improved adherence to prescribed treatment plan for AFIB  as evidenced byrate controlled, compliance with medications, and working with CCM team to optimize health and well being. Interventions:  . Collaboration with  Jon Billings, NP regarding development and update of comprehensive plan of care as evidenced by provider attestation and co-signature . Inter-disciplinary care team collaboration (see longitudinal plan of care) . Basic overview and discussion of afib disease state . Medications reviewed . Afib action plan reviewed . Evaluation of current treatment plan related to Afib and patient's adherence to plan as established by provider. . Advised patient to call the office for changes in conditions or questions  . Provided education to patient re: sx /sx of uncontrolled heart rate, to monitor for changes in conditions, calling the office for changes in breathing or other issues impacting care of chronic conditions  . Reviewed scheduled/upcoming provider appointments including:  . Discussed plans with patient for ongoing care management follow up and provided patient with direct contact information for care management team Self-Care Activities: Patient verbalizes understanding of plan to effective management of AFIB Self administers medications as prescribed Attends all scheduled provider appointments Calls pharmacy for medication refills Attends church or other social activities Performs ADL's independently Performs IADL's independently Calls provider  office for new concerns or questions Patient Goals: - check pulse (heart) rate once a day - cut down alcohol use - make a plan to eat healthy - keep all lab appointments - stop my alcohol use - take medicine as prescribed - activity or exercise based on tolerance encouraged - barriers to lifestyle changes reviewed and addressed - reassurance provided - rescue (action) plan developed - response to pharmacologic therapy monitored - screen for functional limitations reviewed - rescue (action) plan use encouraged Follow Up Plan: Telephone follow up appointment with care management team member scheduled for: 12-28-2020 at 1145   Task: RNCM: Alleviate Barriers to Dysrhythmia Management   Note:   Care Management Activities:    - activity or exercise based on tolerance encouraged - barriers to lifestyle changes reviewed and addressed - reassurance provided - rescue (action) plan developed - response to pharmacologic therapy monitored - screen for functional limitations reviewed - rescue (action) plan use encouraged         The patient verbalized understanding of instructions, educational materials, and care plan provided today and declined offer to receive copy of patient instructions, educational materials, and care plan.   Telephone follow up appointment with care management team member scheduled for: 12-28-2020 at 1145 am  [place], MSN, [facility] Mobile: [phone]

## 2020-11-28 ENCOUNTER — Ambulatory Visit: Payer: Medicare Other | Admitting: Nurse Practitioner

## 2020-11-28 ENCOUNTER — Other Ambulatory Visit: Payer: Self-pay | Admitting: Family Medicine

## 2020-11-29 ENCOUNTER — Other Ambulatory Visit: Payer: Self-pay

## 2020-11-29 ENCOUNTER — Encounter: Payer: Self-pay | Admitting: Nurse Practitioner

## 2020-11-29 ENCOUNTER — Ambulatory Visit (INDEPENDENT_AMBULATORY_CARE_PROVIDER_SITE_OTHER): Payer: Medicare Other | Admitting: Nurse Practitioner

## 2020-11-29 VITALS — BP 124/63 | HR 50 | Temp 97.8°F | Wt 161.8 lb

## 2020-11-29 DIAGNOSIS — I5020 Unspecified systolic (congestive) heart failure: Secondary | ICD-10-CM

## 2020-11-29 DIAGNOSIS — F419 Anxiety disorder, unspecified: Secondary | ICD-10-CM

## 2020-11-29 DIAGNOSIS — F32A Depression, unspecified: Secondary | ICD-10-CM

## 2020-11-29 DIAGNOSIS — I10 Essential (primary) hypertension: Secondary | ICD-10-CM | POA: Diagnosis not present

## 2020-11-29 DIAGNOSIS — I480 Paroxysmal atrial fibrillation: Secondary | ICD-10-CM

## 2020-11-29 DIAGNOSIS — E782 Mixed hyperlipidemia: Secondary | ICD-10-CM | POA: Diagnosis not present

## 2020-11-29 DIAGNOSIS — N1832 Chronic kidney disease, stage 3b: Secondary | ICD-10-CM

## 2020-11-29 MED ORDER — POTASSIUM CHLORIDE ER 10 MEQ PO TBCR: EXTENDED_RELEASE_TABLET | ORAL | 1 refills | Status: DC

## 2020-11-29 MED ORDER — ELIQUIS 5 MG PO TABS: 1.0000 | ORAL_TABLET | Freq: Two times a day (BID) | ORAL | 1 refills | Status: DC

## 2020-11-29 NOTE — Assessment & Plan Note (Signed)
Chronic, ongoing.  Continue current medication regimen and adjust as needed.  Patient declines referral to Nephrology.  Recommend he monitor BP at home daily and bring to next visit.  CMP today.

## 2020-11-29 NOTE — Assessment & Plan Note (Signed)
Stable off medication, continue to monitor.

## 2020-11-29 NOTE — Assessment & Plan Note (Signed)
Chronic, under better control at visit today.  Continue with current medication regimen. Patient does not want to see Cardiology for assistance with blood pressure control.  Recommend he monitor BP at home daily and document for providers + focus on DASH diet.  Minimize alcohol use.  Check CMP today.  Return in 2 months.

## 2020-11-29 NOTE — Assessment & Plan Note (Signed)
Chronic, ongoing with rate-controlled at this time.  Continue current medication regimen and adjust as needed.  Recommended he return to cardiology for follow-up.  Patient declines going to Cardiology as they had requested.  Labs today.

## 2020-11-29 NOTE — Assessment & Plan Note (Signed)
Noted on 12/08/2018 with EF 35-40%.  No repeat echo noted.  Euvolemic at this time.  He was to follow-up with cardiology in September 2020, have highly recommend he do this.  At this time continue current medication regimen and adjust as needed.  Patient declines repeat ECHO. CMP today.  Recommend: - Reminded to call for an overnight weight gain of >2 pounds or a weekly weight weight of >5 pounds - not adding salt to his food and has been reading food labels. Reviewed the importance of keeping daily sodium intake to 2000mg  daily

## 2020-11-29 NOTE — Progress Notes (Signed)
BP 124/63   Pulse (!) 50   Temp 97.8 F (36.6 C) (Oral)   Wt 161 lb 12.8 oz (73.4 kg)   SpO2 98%   BMI 26.00 kg/m    Subjective:    Patient ID: Caleb Platt., male    DOB: May 09, 1932, 87 y.o.   MRN: [id number]  HPI: Caleb Fleming is a 87 y.o. male  Chief Complaint  Patient presents with  . Hyperlipidemia  . Hypertension  . Diabetes   HYPERTENSION / HYPERLIPIDEMIA Satisfied with current treatment? yes Duration of hypertension: years BP monitoring frequency: not checking BP range:  BP medication side effects: no Past BP meds: Metoprolol Duration of hyperlipidemia: years Cholesterol medication side effects: no Cholesterol supplements: none Past cholesterol medications: simvastatin (zocor) Medication compliance: excellent compliance Aspirin: no Recent stressors: no Recurrent headaches: no Visual changes: no Palpitations: no Dyspnea: no Chest pain: no Lower extremity edema: no Dizzy/lightheaded: no   AFIB Rate Stable.  Denies concerns.  Does not see Cardiology.   ANXIETY AND DEPRESSION Patient denies SI/HI in office today. Denies feeling down or anxious.  Relevant past medical, surgical, family and social history reviewed and updated as indicated. Interim medical history since our last visit reviewed. Allergies and medications reviewed and updated.  Review of Systems  Eyes: Negative for visual disturbance.  Respiratory: Negative for shortness of breath.   Cardiovascular: Negative for chest pain and leg swelling.  Neurological: Negative for light-headedness and headaches.  Psychiatric/Behavioral: Negative for dysphoric mood and suicidal ideas. The patient is not nervous/anxious.     Per HPI unless specifically indicated above     Objective:    BP 124/63   Pulse (!) 50   Temp 97.8 F (36.6 C) (Oral)   Wt 161 lb 12.8 oz (73.4 kg)   SpO2 98%   BMI 26.00 kg/m   Wt Readings from Last 3 Encounters:  11/29/20 161 lb 12.8 oz (73.4 kg)   09/29/20 160 lb (72.6 kg)  08/31/20 165 lb (74.8 kg)    Physical Exam Vitals and nursing note reviewed.  Constitutional:      General: He is not in acute distress.    Appearance: Normal appearance. He is not ill-appearing, toxic-appearing or diaphoretic.  HENT:     Head: Normocephalic.     Right Ear: External ear normal.     Left Ear: External ear normal.     Nose: Nose normal. No congestion or rhinorrhea.     Mouth/Throat:     Mouth: Mucous membranes are moist.  Eyes:     General:        Right eye: No discharge.        Left eye: No discharge.     Extraocular Movements: Extraocular movements intact.     Conjunctiva/sclera: Conjunctivae normal.     Pupils: Pupils are equal, round, and reactive to light.  Cardiovascular:     Rate and Rhythm: Normal rate and regular rhythm.     Heart sounds: No murmur heard.   Pulmonary:     Effort: Pulmonary effort is normal. No respiratory distress.     Breath sounds: Normal breath sounds. No wheezing, rhonchi or rales.  Abdominal:     General: Abdomen is flat. Bowel sounds are normal.  Musculoskeletal:     Cervical back: Normal range of motion and neck supple.  Skin:    General: Skin is warm and dry.     Capillary Refill: Capillary refill takes less than 2 seconds.  Neurological:  General: No focal deficit present.     Mental Status: He is alert and oriented to person, place, and time.  Psychiatric:        Mood and Affect: Mood normal.        Behavior: Behavior normal.        Thought Content: Thought content normal.        Judgment: Judgment normal.     Results for orders placed or performed in visit on 08/31/20  CBC w/Diff  Result Value Ref Range   WBC 9.6 3.4 - 10.8 x10E3/uL   RBC 4.02 (L) 4.14 - 5.80 x10E6/uL   Hemoglobin 11.9 (L) 13.0 - 17.7 g/dL   Hematocrit 36.6 (L) 37.5 - 51.0 %   MCV 91 79 - 97 fL   MCH 29.6 26.6 - 33.0 pg   MCHC 32.5 31.5 - 35.7 g/dL   RDW 12.8 11.6 - 15.4 %   Platelets 281 150 - 450 x10E3/uL    Neutrophils 67 Not Estab. %   Lymphs 17 Not Estab. %   Monocytes 10 Not Estab. %   Eos 4 Not Estab. %   Basos 1 Not Estab. %   Neutrophils Absolute 6.5 1.4 - 7.0 x10E3/uL   Lymphocytes Absolute 1.6 0.7 - 3.1 x10E3/uL   Monocytes Absolute 1.0 (H) 0.1 - 0.9 x10E3/uL   EOS (ABSOLUTE) 0.4 0.0 - 0.4 x10E3/uL   Basophils Absolute 0.1 0.0 - 0.2 x10E3/uL   Immature Granulocytes 1 Not Estab. %   Immature Grans (Abs) 0.1 0.0 - 0.1 x10E3/uL  Comprehensive metabolic panel  Result Value Ref Range   Glucose 82 65 - 99 mg/dL   BUN 17 8 - 27 mg/dL   Creatinine, Ser 1.29 (H) 0.76 - 1.27 mg/dL   GFR calc non Af Amer 50 (L) >59 mL/min/1.73   GFR calc Af Amer 58 (L) >59 mL/min/1.73   BUN/Creatinine Ratio 13 10 - 24   Sodium 139 134 - 144 mmol/L   Potassium 4.1 3.5 - 5.2 mmol/L   Chloride 103 96 - 106 mmol/L   CO2 19 (L) 20 - 29 mmol/L   Calcium 8.9 8.6 - 10.2 mg/dL   Total Protein 6.1 6.0 - 8.5 g/dL   Albumin 4.1 3.6 - 4.6 g/dL   Globulin, Total 2.0 1.5 - 4.5 g/dL   Albumin/Globulin Ratio 2.1 1.2 - 2.2   Bilirubin Total 0.3 0.0 - 1.2 mg/dL   Alkaline Phosphatase 128 (H) 44 - 121 IU/L   AST 21 0 - 40 IU/L   ALT 20 0 - 44 IU/L      Assessment & Plan:   Problem List Items Addressed This Visit      Cardiovascular and Mediastinum   Hypertension    Chronic, under better control at visit today.  Continue with current medication regimen. Patient does not want to see Cardiology for assistance with blood pressure control.  Recommend he monitor BP at home daily and document for providers + focus on DASH diet.  Minimize alcohol use.  Check CMP today.  Return in 2 months.      Relevant Medications   apixaban (ELIQUIS) 5 MG TABS tablet   Other Relevant Orders   Comp Met (CMET)   Lipid Profile   AF (paroxysmal atrial fibrillation) (HCC)    Chronic, ongoing with rate-controlled at this time.  Continue current medication regimen and adjust as needed.  Recommended he return to cardiology for  follow-up.  Patient declines going to Cardiology as they had requested.  Labs  today.        Relevant Medications   apixaban (ELIQUIS) 5 MG TABS tablet   HFrEF (heart failure with reduced ejection fraction) ([place]) - Primary    Noted on 12/08/2018 with EF 35-40%.  No repeat echo noted.  Euvolemic at this time.  He was to follow-up with cardiology in September 2020, have highly recommend he do this.  At this time continue current medication regimen and adjust as needed.  Patient declines repeat ECHO. CMP today.  Recommend: - Reminded to call for an overnight weight gain of >2 pounds or a weekly weight weight of >5 pounds - not adding salt to his food and has been reading food labels. Reviewed the importance of keeping daily sodium intake to <2028m daily       Relevant Medications   apixaban (ELIQUIS) 5 MG TABS tablet     Genitourinary   CKD (chronic kidney disease), stage III (HCC)    Chronic, ongoing.  Continue current medication regimen and adjust as needed.  Patient declines referral to Nephrology.  Recommend he monitor BP at home daily and bring to next visit.  CMP today.          Other   Hyperlipidemia    Chronic, ongoing.  Continue current medication regimen and adjust as needed.  Refills sent in.  Lipid panel today.         Relevant Medications   apixaban (ELIQUIS) 5 MG TABS tablet   Other Relevant Orders   Comp Met (CMET)   Lipid Profile   Anxiety and depression    Stable off medication, continue to monitor.          Follow up plan: Return in about 3 months (around 03/01/2021) for HTN, HLD, DM2 FU.

## 2020-11-29 NOTE — Assessment & Plan Note (Signed)
Chronic, ongoing.  Continue current medication regimen and adjust as needed.  Refills sent in.  Lipid panel today.    

## 2020-11-30 LAB — COMPREHENSIVE METABOLIC PANEL
ALT: 20 IU/L (ref 0–44)
AST: 19 IU/L (ref 0–40)
Albumin/Globulin Ratio: 2 (ref 1.2–2.2)
Albumin: 4 g/dL (ref 3.6–4.6)
Alkaline Phosphatase: 92 IU/L (ref 44–121)
BUN/Creatinine Ratio: 10 (ref 10–24)
BUN: 15 mg/dL (ref 8–27)
Bilirubin Total: 0.2 mg/dL (ref 0.0–1.2)
CO2: 22 mmol/L (ref 20–29)
Calcium: 8.8 mg/dL (ref 8.6–10.2)
Chloride: 105 mmol/L (ref 96–106)
Creatinine, Ser: 1.52 mg/dL — ABNORMAL HIGH (ref 0.76–1.27)
Globulin, Total: 2 g/dL (ref 1.5–4.5)
Glucose: 89 mg/dL (ref 65–99)
Potassium: 4.8 mmol/L (ref 3.5–5.2)
Sodium: 142 mmol/L (ref 134–144)
Total Protein: 6 g/dL (ref 6.0–8.5)
eGFR: 44 mL/min/{1.73_m2} — ABNORMAL LOW (ref >59)

## 2020-11-30 LAB — LIPID PANEL
Chol/HDL Ratio: 2.5 ratio (ref 0.0–5.0)
Cholesterol, Total: 128 mg/dL (ref 100–199)
HDL: 51 mg/dL (ref >39)
LDL Chol Calc (NIH): 61 mg/dL (ref 0–99)
Triglycerides: 83 mg/dL (ref 0–149)
VLDL Cholesterol Cal: 16 mg/dL (ref 5–40)

## 2020-11-30 NOTE — Progress Notes (Signed)
Please let Mr. Langland know that his lab work shows his kidney function worsened.  I would like him to come back in 2 weeks to repeat his lab work to make sure it returns to normal.  He should increase his water intake to help with his kidney function.  Please make patient a lab appointment. Other lab work looks good.

## 2020-11-30 NOTE — Addendum Note (Signed)
Addended by: Larae Grooms on: 11/30/2020 08:37 AM   Modules accepted: Orders

## 2020-12-15 ENCOUNTER — Other Ambulatory Visit: Payer: Self-pay

## 2020-12-28 ENCOUNTER — Telehealth: Payer: Self-pay | Admitting: General Practice

## 2020-12-28 ENCOUNTER — Telehealth: Payer: Self-pay

## 2020-12-28 NOTE — Telephone Encounter (Signed)
  Care Management   Follow Up Note   12/28/2020 Name: Ziyan Schoon. MRN: [id number] DOB: [birth year]   Referred by: Larae Grooms, NP Reason for referral : Chronic Care Management (RNCM: Follow up for Chronic Disease Management and Care Coordination Needs)   An unsuccessful telephone outreach was attempted today. The patient was referred to the case management team for assistance with care management and care coordination.   Follow Up Plan: A HIPPA compliant phone message was left for the patient providing contact information and requesting a return call.   [name] RN, MSN, CCM Community Care Coordinator [place]  [facility] Mobile: [phone]

## 2020-12-29 ENCOUNTER — Telehealth: Payer: Self-pay | Admitting: Pharmacist

## 2020-12-29 NOTE — Chronic Care Management (AMB) (Signed)
    Chronic Care Management Pharmacy Assistant   Name: Caleb Fleming.  MRN: [id number] DOB: [birth year]   Reason for Encounter: Disease State General Aherence    Recent office visits:  11/29/20-Karen Caren Griffins, NP (PCP) General follow up.  Patient declines referral to Nephrology. Labs ordered. Follow up in 3 months. 09/29/20 Larae Grooms, NP (PCP) Hypertension follow up. Follow up in 2 months. 08/31/20-Karen Caren Griffins, NP (PCP) General follow up.  Patient declines referral to Nephrology. Labs ordered. Follow up in 1 month. 07/28/20-Jolene Cannady, NP. Follow up head wound. 07/21/20-Jolene Cannady, NP. Seen for suture removal. Start taking Keflex. Follow up in 1 week. Recent consult visits:  None noted  Hospital visits:  Medication Reconciliation was completed by comparing discharge summary, patient's EMR and Pharmacy list, and upon discussion with patient.  Admitted to the hospital on 07/17/20 due to Fall. Discharge date was 07/17/20. Discharged from [facility].    New?Medications Started at [facility] Discharge:?? -started Keflex 250 mg cap  Medication Changes at Hospital Discharge: -Changed None noted  Medications Discontinued at Hospital Discharge: -Stopped None noted  Medications that remain the same after Hospital Discharge:??  -All other medications will remain the same.    Medications: Outpatient Encounter Medications as of 12/29/2020  Medication Sig   acetaminophen (TYLENOL) 325 MG tablet Take 650 mg by mouth every 4 (four) hours as needed.   allopurinol (ZYLOPRIM) 100 MG tablet Take 1 tablet (100 mg total) by mouth daily.   amiodarone (PACERONE) 200 MG tablet Take 1 tablet (200 mg total) by mouth daily.   apixaban (ELIQUIS) 5 MG TABS tablet Take 1 tablet (5 mg total) by mouth 2 (two) times daily.   metoprolol succinate (TOPROL-XL) 25 MG 24 hr tablet Take 1 tablet (25 mg total) by mouth daily.   potassium chloride (KLOR-CON) 10 MEQ tablet  TAKE 1 TABLET (10 MEQ TOTAL) BY MOUTH 2 (TWO) TIMES DAILY.   simvastatin (ZOCOR) 20 MG tablet Take 1 tablet (20 mg total) by mouth daily at 6 PM.   zinc sulfate 220 (50 Zn) MG capsule Take 1 capsule (220 mg total) by mouth daily.   No facility-administered encounter medications on file as of 12/29/2020.   Unable to reach patient for General Adherence disease state call.  Star Rating Drugs: Simvastatin 20 mg Last filled:0606/22 90 DS  Myriam Carolin Coy, RMA Health Concierge

## 2021-01-13 NOTE — Telephone Encounter (Signed)
Patient has been rescheduled.

## 2021-02-12 ENCOUNTER — Other Ambulatory Visit: Payer: Self-pay | Admitting: Family Medicine

## 2021-02-12 NOTE — Telephone Encounter (Signed)
dc'd 08/31/20 completed course Larae Grooms NP

## 2021-02-15 ENCOUNTER — Ambulatory Visit (INDEPENDENT_AMBULATORY_CARE_PROVIDER_SITE_OTHER): Payer: Medicare Other | Admitting: General Practice

## 2021-02-15 ENCOUNTER — Telehealth: Payer: Medicare Other | Admitting: General Practice

## 2021-02-15 DIAGNOSIS — I480 Paroxysmal atrial fibrillation: Secondary | ICD-10-CM | POA: Diagnosis not present

## 2021-02-15 DIAGNOSIS — I10 Essential (primary) hypertension: Secondary | ICD-10-CM | POA: Diagnosis not present

## 2021-02-15 DIAGNOSIS — I5020 Unspecified systolic (congestive) heart failure: Secondary | ICD-10-CM

## 2021-02-15 DIAGNOSIS — M109 Gout, unspecified: Secondary | ICD-10-CM

## 2021-02-15 NOTE — Chronic Care Management (AMB) (Signed)
Chronic Care Management   CCM RN Visit Note  02/15/2021 Name: Caleb Fleming. MRN: [id number] DOB: 1931-09-10  Subjective: Caleb Fleming. is a 87 y.o. year old male who is a primary care patient of Jon Billings, NP. The care management team was consulted for assistance with disease management and care coordination needs.    Engaged with patient by telephone for follow up visit in response to provider referral for case management and/or care coordination services.   Consent to Services:  The patient was given information about Chronic Care Management services, agreed to services, and gave verbal consent prior to initiation of services.  Please see initial visit note for detailed documentation.   Patient agreed to services and verbal consent obtained.   Assessment: Review of patient past medical history, allergies, medications, health status, including review of consultants reports, laboratory and other test data, was performed as part of comprehensive evaluation and provision of chronic care management services.   SDOH (Social Determinants of Health) assessments and interventions performed:  SDOH Interventions    Flowsheet Row Most Recent Value  SDOH Interventions   Physical Activity Interventions Other (Comments)  [limited mobility, no falls]  Social Connections Interventions Other (Comment)  [lives with family, good support system]        CCM Care Plan  Allergies  Allergen Reactions   Bactrim [Sulfamethoxazole-Trimethoprim] Itching   Codeine     Outpatient Encounter Medications as of 02/15/2021  Medication Sig   acetaminophen (TYLENOL) 325 MG tablet Take 650 mg by mouth every 4 (four) hours as needed.   allopurinol (ZYLOPRIM) 100 MG tablet Take 1 tablet (100 mg total) by mouth daily.   amiodarone (PACERONE) 200 MG tablet Take 1 tablet (200 mg total) by mouth daily.   apixaban (ELIQUIS) 5 MG TABS tablet Take 1 tablet (5 mg total) by mouth 2 (two) times  daily.   metoprolol succinate (TOPROL-XL) 25 MG 24 hr tablet Take 1 tablet (25 mg total) by mouth daily.   potassium chloride (KLOR-CON) 10 MEQ tablet TAKE 1 TABLET (10 MEQ TOTAL) BY MOUTH 2 (TWO) TIMES DAILY.   simvastatin (ZOCOR) 20 MG tablet Take 1 tablet (20 mg total) by mouth daily at 6 PM.   zinc sulfate 220 (50 Zn) MG capsule Take 1 capsule (220 mg total) by mouth daily.   No facility-administered encounter medications on file as of 02/15/2021.    Patient Active Problem List   Diagnosis Date Noted   Aortic atherosclerosis ([place]) 10/04/2020   Laceration of scalp 07/21/2020   HFrEF (heart failure with reduced ejection fraction) ([place]) 05/18/2020   CKD (chronic kidney disease), stage III ([place]) 05/18/2020   Senile purpura ([place]) 05/18/2020   AF (paroxysmal atrial fibrillation) ([place]) 02/11/2020   History of COVID-19 02/11/2020   Alcohol abuse 12/25/2018   Benign prostatic hyperplasia 05/26/2015   Gout 05/26/2015   Hyperlipidemia 05/26/2015   Insomnia 05/26/2015   Hypogonadism in male 05/26/2015   Hypertension 05/26/2015   Anxiety and depression 05/26/2015    Conditions to be addressed/monitored:Atrial Fibrillation, CHF, HTN, and gout flare up  Care Plan : RNCM: Heart Failure (Adult)  Updates made by Vanita Ingles since 02/15/2021 12:00 AM     Problem: RNCM: Symptom Exacerbation (Heart Failure)   Priority: Medium     Long-Range Goal: RNCM: Symptom Exacerbation Prevented or Minimized   Start Date: 10/26/2020  Expected End Date: 10/26/2021  This Visit's Progress: On track  Priority: Medium  Note:   Current Barriers:  Knowledge  deficits related to basic heart failure pathophysiology and self care management Lacks social connections Does not maintain contact with provider office Does not contact provider office for questions/concerns Lack of scale in home Financial strain EF% 35-40% Nurse Case Manager Clinical Goal(s):  patient will weigh self daily and record patient  will verbalize understanding of Heart Failure Action Plan and when to call doctor patient will take all Heart Failure mediations as prescribed Interventions:  Collaboration with Jon Billings, NP regarding development and update of comprehensive plan of care as evidenced by provider attestation and co-signature Inter-disciplinary care team collaboration (see longitudinal plan of care) Basic overview and discussion of pathophysiology of Heart Failure. 02-15-2021: The patient DIL helps manage the patients medications. He is compliant with medications at this time. States he is not having any swelling in his feet or legs. She is monitoring his meals and does not add salt to his foods. She states compliance with a heart healthy diet.  Provided written and verbal education on low sodium diet. 02-15-2021: Education and review. The patient is compliant with a heart healthy diet. The DIL closely monitors his dietary habits.  Reviewed Heart Failure Action Plan in depth and provided written copy Assessed for scales in home- has scales but does not weigh daily Discussed importance of daily weight Reviewed role of diuretics in prevention of fluid overload Self-Care Activities:  Takes Heart Failure Medications as prescribed Weighs daily and record (notifying MD of 3 lb weight gain over night or 5 lb in a week) Verbalizes understanding of and follows CHF Action Plan Adheres to low sodium diet  Patient Goals:  - Take Heart Failure Medications as prescribed - Weigh daily and record (notify MD with 3 lb weight gain over night or 5 lb in a week) - Follow CHF Action Plan - Adhere to low sodium diet - eat more whole grains, fruits and vegetables, lean meats and healthy fats - follow rescue plan if symptoms flare-up - know when to call the doctor - track symptoms and what helps feel better or worse - dress right for the weather, hot or cold - avoid heavy exercise on very hot days - drink water to stay  hydrated during exercise - follow activity or exercise plan - make an activity or exercise plan - pace activity allowing for rest - warm up and cool down for 10 minutes before and after exercise - call office if I gain more than 2 pounds in one day or 5 pounds in one week - keep legs up while sitting - use salt in moderation - watch for swelling in feet, ankles and legs every day - weigh myself daily barriers to lifestyle changes reviewed and addressed - barriers to treatment reviewed and addressed - cognitive screening completed and reviewed - depression screen reviewed - health literacy screening completed or reviewed - healthy lifestyle promoted - medication-adherence assessment completed - rescue (action) plan developed - rescue (action) plan reviewed - self-awareness of signs/symptoms of worsening disease encouraged    Notes: lives with son and daughter in law who help manage his care Follow Up Plan: Telephone follow up appointment with care management team member scheduled for: 04-19-2021 at 1145 am    Task: RNCM: Identify and Minimize Risk of Heart Failure Exacerbation Completed 02/15/2021  Outcome: Positive  Note:   Care Management Activities:    - barriers to lifestyle changes reviewed and addressed - barriers to treatment reviewed and addressed - cognitive screening completed and reviewed - depression screen reviewed -  health literacy screening completed or reviewed - healthy lifestyle promoted - medication-adherence assessment completed - rescue (action) plan developed - rescue (action) plan reviewed - self-awareness of signs/symptoms of worsening disease encouraged    Notes: lives with son and daughter in law who help manage his care    Care Plan : RNCM: Hypertension (Adult)  Updates made by Vanita Ingles since 02/15/2021 12:00 AM     Problem: RNCM: Hypertension (Hypertension)   Priority: Medium     Long-Range Goal: RNCM: Hypertension Monitored   Start  Date: 10/26/2020  Expected End Date: 10/26/2021  This Visit's Progress: On track  Priority: Medium  Note:   Objective:  Last practice recorded BP readings:  BP Readings from Last 3 Encounters:  11/29/20 124/63  09/29/20 (!) 158/86  08/31/20 (!) 175/81   Most recent eGFR/CrCl: No results found for: EGFR  No components found for: CRCL Current Barriers:  Knowledge Deficits related to basic understanding of hypertension pathophysiology and self care management Knowledge Deficits related to understanding of medications prescribed for management of hypertension Limited Social Support Unable to independently manage HTN Unable to self administer medications as prescribed Lacks social connections Does not maintain contact with provider office Does not contact provider office for questions/concerns Case Manager Clinical Goal(s):  patient will verbalize understanding of plan for hypertension management patient will attend all scheduled medical appointments: 03-01-2021 at 10:40am patient will demonstrate improved adherence to prescribed treatment plan for hypertension as evidenced by taking all medications as prescribed, monitoring and recording blood pressure as directed, adhering to low sodium/DASH diet patient will demonstrate improved health management independence as evidenced by checking blood pressure as directed and notifying PCP if SBP>160 or DBP > 90, taking all medications as prescribe, and adhering to a low sodium diet as discussed. patient will verbalize basic understanding of hypertension disease process and self health management plan as evidenced by compliance with medications, compliance with heart healthy diet, and working with CCM team for management of health and well being.  Interventions:  Collaboration with Jon Billings, NP regarding development and update of comprehensive plan of care as evidenced by provider attestation and co-signature Inter-disciplinary care team  collaboration (see longitudinal plan of care) Evaluation of current treatment plan related to hypertension self management and patient's adherence to plan as established by provider. 02-15-2021: Spoke to the patients DIL [name], she states the patient is doing well. Only thing new right now is he is having a gout flare up in his foot. She has his medications refilled and reviewed dietary restrictions to follow. Advised the DIL to call the office for unresolved gout flare up. The patient has stable blood pressures. Was asleep at the time of the call. DIL states he has been doing well. Has follow up with pcp on 03-01-2021.  Provided education to patient re: stroke prevention, s/s of heart attack and stroke, DASH diet, complications of uncontrolled blood pressure. 02-15-2021: Review of dietary restrictions and staying hydrated. The patient drinks a lot of water. The patient is compliant with a heart healthy diet.  Reviewed medications with patient and discussed importance of compliance. 02-15-2021: Refills picked up today. Denies any issues with medications compliance Discussed plans with patient for ongoing care management follow up and provided patient with direct contact information for care management team Advised patient, providing education and rationale, to monitor blood pressure daily and record, calling PCP for findings outside established parameters.  Reviewed scheduled/upcoming provider appointments including: 8-24-202 at 1040 am Self-Care Activities: - Self administers  medications as prescribed Attends all scheduled provider appointments Calls provider office for new concerns, questions, or BP outside discussed parameters Checks BP and records as discussed Follows a low sodium diet/DASH diet Patient Goals: - check blood pressure weekly - choose a place to take my blood pressure (home, clinic or office, retail store) - write blood pressure results in a log or diary - agree on reward when goals are  met - agree to work together to make changes - ask questions to understand - have a family meeting to talk about healthy habits - learn about high blood pressure - blood pressure trends reviewed - depression screen reviewed - home or ambulatory blood pressure monitoring encouraged  Follow Up Plan: Telephone follow up appointment with care management team member scheduled for: 04-19-2021 at 11:45 am    Task: RNCM: Identify and Monitor Blood Pressure Elevation Completed 02/15/2021  Outcome: Positive  Note:   Care Management Activities:    - blood pressure trends reviewed - depression screen reviewed - home or ambulatory blood pressure monitoring encouraged        Care Plan : RNCM: Atrial Fibrillation (Adult)  Updates made by Vanita Ingles since 02/15/2021 12:00 AM     Problem: RNCM: Dysrhythmia (Atrial Fibrillation)   Priority: Medium     Long-Range Goal: RNCM: Heart Rate and Rhythm Monitored and Managed/AFIB   Start Date: 10/26/2020  Expected End Date: 10/28/2021  This Visit's Progress: On track  Priority: Medium  Note:   Current Barriers:  Knowledge deficits related to self health management of chronic afib Chronic Disease Management support and education needs related to effective management of AFIB Lacks caregiver support.  Unable to independently manage AFIB Lacks social connections Does not maintain contact with provider office Does not contact provider office for questions/concerns Nurse Case Manager Clinical Goal(s):  patient will take all medications exactly as prescribed and will call provider for medication related questions patient will verbalize understanding of Afib Action Plan and when to call doctor patient will verbalize understanding of plan for effective management of AFIB patient will work with [facility] and pcp to address needs related to AFIB management  patient will attend all scheduled medical appointments: 03-01-2021 at 10:40 am patient will  demonstrate improved adherence to prescribed treatment plan for AFIB  as evidenced byrate controlled, compliance with medications, and working with CCM team to optimize health and well being. Interventions:  Collaboration with Jon Billings, NP regarding development and update of comprehensive plan of care as evidenced by provider attestation and co-signature Inter-disciplinary care team collaboration (see longitudinal plan of care) Basic overview and discussion of afib disease state Medications reviewed. 02-15-2021: Compliant with medications. DIL [name] assist with medications management  Afib action plan reviewed Evaluation of current treatment plan related to Afib and patient's adherence to plan as established by provider. 02-15-2021: [name] states that the patient is doing well and denies any issue with AFIB exacerbation.  Advised patient to call the office for changes in conditions or questions  Provided education to patient re: sx /sx of uncontrolled heart rate, to monitor for changes in conditions, calling the office for changes in breathing or other issues impacting care of chronic conditions  Reviewed scheduled/upcoming provider appointments including:  Discussed plans with patient for ongoing care management follow up and provided patient with direct contact information for care management team Self-Care Activities: Patient verbalizes understanding of plan to effective management of AFIB Self administers medications as prescribed Attends all scheduled provider appointments Calls pharmacy for  medication refills Attends church or other social activities Performs ADL's independently Performs IADL's independently Calls provider office for new concerns or questions Patient Goals: - check pulse (heart) rate once a day - cut down alcohol use - make a plan to eat healthy - keep all lab appointments - stop my alcohol use - take medicine as prescribed - activity or exercise based on  tolerance encouraged - barriers to lifestyle changes reviewed and addressed - reassurance provided - rescue (action) plan developed - response to pharmacologic therapy monitored - screen for functional limitations reviewed - rescue (action) plan use encouraged Follow Up Plan: Telephone follow up appointment with care management team member scheduled for: 04-19-2021 at 1145    Task: RNCM: Alleviate Barriers to Dysrhythmia Management Completed 02/15/2021  Outcome: Positive  Note:   Care Management Activities:    - activity or exercise based on tolerance encouraged - barriers to lifestyle changes reviewed and addressed - reassurance provided - rescue (action) plan developed - response to pharmacologic therapy monitored - screen for functional limitations reviewed - rescue (action) plan use encouraged         Plan:Telephone follow up appointment with care management team member scheduled for:  04-19-2021 at [place] am  Noreene Larsson RN, MSN, [facility] Mobile: [phone]

## 2021-02-15 NOTE — Patient Instructions (Signed)
Visit Information  PATIENT GOALS:  Goals Addressed             This Visit's Progress    RNCM: Gout flare up to foot       02-15-2021: Per the patient daughter in law [name], the patient has a current gout flare up. She states it is in his foot. The patient was asleep at the time of the call and states she just got his Allopurinol refilled. Endorsees compliance with medications. Discussed foods that can increase gout flare up. Per [name] the patient has decreased his consumption of beer.  Patient Goals: -take medications as prescribed -call pcp for increased pain in foot due to gout exacerbation -stay away from foods high in uric acid (purines) - beer, red meats, processed foods     RNCM: Track and Manage My Blood Pressure-Hypertension       Timeframe:  Long-Range Goal Priority:  High Start Date:     10-26-2020                        Expected End Date:     10-26-2021                 Follow Up Date 04-19-2021   - check blood pressure weekly - choose a place to take my blood pressure (home, clinic or office, retail store) - write blood pressure results in a log or diary    Why is this important?   You won't feel high blood pressure, but it can still hurt your blood vessels.  High blood pressure can cause heart or kidney problems. It can also cause a stroke.  Making lifestyle changes like losing a little weight or eating less salt will help.  Checking your blood pressure at home and at different times of the day can help to control blood pressure.  If the doctor prescribes medicine remember to take it the way the doctor ordered.  Call the office if you cannot afford the medicine or if there are questions about it.     Notes: Daughter in law states they have a blood pressure cuff now. Encouraged her to write down the readings and take to appointments. 02-15-2021: The patient has stable blood pressures. Denies any new concerns with blood pressures. Will continue to monitor for changes.          The patient verbalized understanding of instructions, educational materials, and care plan provided today and declined offer to receive copy of patient instructions, educational materials, and care plan.   Telephone follow up appointment with care management team member scheduled for: 04-19-2021 at 1145 am   [name] RN, MSN, CCM Community Care Coordinator [place]  [facility] Mobile: [phone]

## 2021-02-28 NOTE — Progress Notes (Signed)
BP 130/78   Pulse (!) 48   Temp 98.3 F (36.8 C) (Oral)   Ht 5' 6.14" (1.68 m)   Wt 158 lb (71.7 kg)   SpO2 98%   BMI 25.39 kg/m    Subjective:    Patient ID: Caleb Platt., male    DOB: Sep 19, 1931, 87 y.o.   MRN: [id number]  HPI: Caleb Fleming is a 87 y.o. male  Chief Complaint  Patient presents with   Diabetes   Hyperlipidemia   Hypertension   HYPERTENSION / HYPERLIPIDEMIA Denies concerns today.  Satisfied with current treatment? yes Duration of hypertension: years BP monitoring frequency: not checking- does not want to check BP range:  BP medication side effects: no Past BP meds:  Metoprolol - Takes Amiodarone for rate control Duration of hyperlipidemia: years Cholesterol medication side effects: no Cholesterol supplements: none Past cholesterol medications: simvastatin (zocor) Medication compliance: excellent compliance Aspirin: no Recent stressors: no Recurrent headaches: no Visual changes: no Palpitations: no Dyspnea: no Chest pain: no Lower extremity edema: no Dizzy/lightheaded: no   AFIB Rate Stable.  Denies concerns.  Does not see Cardiology.  Happy with current regimen of Amiodarone 21m daily.  ANXIETY AND DEPRESSION Patient denies SI/HI in office today. Denies feeling down or anxious.  Does not need medication. Happy that he wakes up everyday.   Relevant past medical, surgical, family and social history reviewed and updated as indicated. Interim medical history since our last visit reviewed. Allergies and medications reviewed and updated.  Review of Systems  Eyes:  Negative for visual disturbance.  Respiratory:  Negative for shortness of breath.   Cardiovascular:  Negative for chest pain and leg swelling.  Neurological:  Negative for light-headedness and headaches.  Psychiatric/Behavioral:  Negative for dysphoric mood and suicidal ideas. The patient is not nervous/anxious.    Per HPI unless specifically indicated above      Objective:    BP 130/78   Pulse (!) 48   Temp 98.3 F (36.8 C) (Oral)   Ht 5' 6.14" (1.68 m)   Wt 158 lb (71.7 kg)   SpO2 98%   BMI 25.39 kg/m   Wt Readings from Last 3 Encounters:  03/01/21 158 lb (71.7 kg)  11/29/20 161 lb 12.8 oz (73.4 kg)  09/29/20 160 lb (72.6 kg)    Physical Exam Vitals and nursing note reviewed.  Constitutional:      General: He is not in acute distress.    Appearance: Normal appearance. He is not ill-appearing, toxic-appearing or diaphoretic.  HENT:     Head: Normocephalic.     Right Ear: External ear normal.     Left Ear: External ear normal.     Nose: Nose normal. No congestion or rhinorrhea.     Mouth/Throat:     Mouth: Mucous membranes are moist.  Eyes:     General:        Right eye: No discharge.        Left eye: No discharge.     Extraocular Movements: Extraocular movements intact.     Conjunctiva/sclera: Conjunctivae normal.     Pupils: Pupils are equal, round, and reactive to light.  Cardiovascular:     Rate and Rhythm: Normal rate and regular rhythm.     Heart sounds: No murmur heard. Pulmonary:     Effort: Pulmonary effort is normal. No respiratory distress.     Breath sounds: Normal breath sounds. No wheezing, rhonchi or rales.  Abdominal:     General:  Abdomen is flat. Bowel sounds are normal.  Musculoskeletal:     Cervical back: Normal range of motion and neck supple.  Skin:    General: Skin is warm and dry.     Capillary Refill: Capillary refill takes less than 2 seconds.  Neurological:     General: No focal deficit present.     Mental Status: He is alert and oriented to person, place, and time.  Psychiatric:        Mood and Affect: Mood normal.        Behavior: Behavior normal.        Thought Content: Thought content normal.        Judgment: Judgment normal.    Results for orders placed or performed in visit on 11/29/20  Comp Met (CMET)  Result Value Ref Range   Glucose 89 65 - 99 mg/dL   BUN 15 8 - 27 mg/dL    Creatinine, Ser 1.52 (H) 0.76 - 1.27 mg/dL   eGFR 44 (L) >59 mL/min/1.73   BUN/Creatinine Ratio 10 10 - 24   Sodium 142 134 - 144 mmol/L   Potassium 4.8 3.5 - 5.2 mmol/L   Chloride 105 96 - 106 mmol/L   CO2 22 20 - 29 mmol/L   Calcium 8.8 8.6 - 10.2 mg/dL   Total Protein 6.0 6.0 - 8.5 g/dL   Albumin 4.0 3.6 - 4.6 g/dL   Globulin, Total 2.0 1.5 - 4.5 g/dL   Albumin/Globulin Ratio 2.0 1.2 - 2.2   Bilirubin Total 0.2 0.0 - 1.2 mg/dL   Alkaline Phosphatase 92 44 - 121 IU/L   AST 19 0 - 40 IU/L   ALT 20 0 - 44 IU/L  Lipid Profile  Result Value Ref Range   Cholesterol, Total 128 100 - 199 mg/dL   Triglycerides 83 0 - 149 mg/dL   HDL 51 >39 mg/dL   VLDL Cholesterol Cal 16 5 - 40 mg/dL   LDL Chol Calc (NIH) 61 0 - 99 mg/dL   Chol/HDL Ratio 2.5 0.0 - 5.0 ratio      Assessment & Plan:   Problem List Items Addressed This Visit       Cardiovascular and Mediastinum   Hypertension - Primary    Chronic.  Controlled.  Continue with current medication regimen of Metoprolol 87m daily. Labs ordered today.  Does not need refills today. Return to clinic in 6 months for reevaluation.  Call sooner if concerns arise.         Relevant Orders   Comp Met (CMET)   AF (paroxysmal atrial fibrillation) (HCC)    Chronic, ongoing with rate-controlled at this time.  Continue current medication regimen and adjust as needed.  Patient declines going to Cardiology.  Will reassess at future visits. Labs today.  Follow up in 3 months.  Call sooner if concerns arise.       Relevant Orders   CBC w/Diff   HFrEF (heart failure with reduced ejection fraction) (HCC)    Chronic, ongoing with rate-controlled at this time.  Continue current medication regimen and adjust as needed.  Patient declines going to Cardiology.  Will reassess at future visits. Labs today.  Follow up in 3 months.  Call sooner if concerns arise.       Relevant Orders   CBC w/Diff   Aortic atherosclerosis (HCC)    Chronic. Controlled.   Continue with current medication regimen of Simvastatin 239mdaily.  No refills needed today. Follow up in 3 months.  Call  sooner if concerns arise.         Genitourinary   CKD (chronic kidney disease), stage III (HCC)    Chronic. Labs ordered today.  Patient declines referral to Nephrology. Will reassess at future visits.  Will make further recommendations based on lab results.         Other   Hyperlipidemia    Chronic. Controlled.  Continue with current medication regimen of Simvastatin 1m daily.  No refills needed today. Follow up in 3 months.  Call sooner if concerns arise.       Relevant Orders   Lipid Profile   Anxiety and depression    Chronic.  Controlled.  Continue with current medication regimen.  Labs ordered today.  Return to clinic in 3 months for reevaluation.  Call sooner if concerns arise.        Relevant Orders   CBC w/Diff     Follow up plan: Return in about 3 months (around 06/01/2021) for HTN, HLD, DM2 FU.

## 2021-03-01 ENCOUNTER — Encounter: Payer: Self-pay | Admitting: Nurse Practitioner

## 2021-03-01 ENCOUNTER — Other Ambulatory Visit: Payer: Self-pay

## 2021-03-01 ENCOUNTER — Ambulatory Visit (INDEPENDENT_AMBULATORY_CARE_PROVIDER_SITE_OTHER): Payer: Medicare Other | Admitting: Nurse Practitioner

## 2021-03-01 VITALS — BP 130/78 | HR 48 | Temp 98.3°F | Ht 66.14 in | Wt 158.0 lb

## 2021-03-01 DIAGNOSIS — N1832 Chronic kidney disease, stage 3b: Secondary | ICD-10-CM | POA: Diagnosis not present

## 2021-03-01 DIAGNOSIS — E782 Mixed hyperlipidemia: Secondary | ICD-10-CM | POA: Diagnosis not present

## 2021-03-01 DIAGNOSIS — F32A Depression, unspecified: Secondary | ICD-10-CM

## 2021-03-01 DIAGNOSIS — I700 Atherosclerosis of aorta: Secondary | ICD-10-CM | POA: Diagnosis not present

## 2021-03-01 DIAGNOSIS — F419 Anxiety disorder, unspecified: Secondary | ICD-10-CM

## 2021-03-01 DIAGNOSIS — I480 Paroxysmal atrial fibrillation: Secondary | ICD-10-CM

## 2021-03-01 DIAGNOSIS — I10 Essential (primary) hypertension: Secondary | ICD-10-CM

## 2021-03-01 DIAGNOSIS — I5020 Unspecified systolic (congestive) heart failure: Secondary | ICD-10-CM | POA: Diagnosis not present

## 2021-03-01 DIAGNOSIS — F1010 Alcohol abuse, uncomplicated: Secondary | ICD-10-CM

## 2021-03-01 NOTE — Assessment & Plan Note (Signed)
Chronic, ongoing with rate-controlled at this time.  Continue current medication regimen and adjust as needed.  Patient declines going to Cardiology.  Will reassess at future visits. Labs today.  Follow up in 3 months.  Call sooner if concerns arise.  

## 2021-03-01 NOTE — Assessment & Plan Note (Signed)
Chronic. Labs ordered today.  Patient declines referral to Nephrology. Will reassess at future visits.  Will make further recommendations based on lab results.

## 2021-03-01 NOTE — Assessment & Plan Note (Signed)
Chronic. Controlled.  Continue with current medication regimen of Simvastatin 20mg  daily.  No refills needed today. Follow up in 3 months.  Call sooner if concerns arise.

## 2021-03-01 NOTE — Assessment & Plan Note (Signed)
Chronic.  Controlled.  Continue with current medication regimen of Metoprolol 25mg  daily. Labs ordered today.  Does not need refills today. Return to clinic in 6 months for reevaluation.  Call sooner if concerns arise.

## 2021-03-01 NOTE — Assessment & Plan Note (Signed)
Chronic. Controlled.  Continue with current medication regimen of Simvastatin 20mg daily.  No refills needed today. Follow up in 3 months.  Call sooner if concerns arise.  

## 2021-03-01 NOTE — Assessment & Plan Note (Signed)
Chronic.  Controlled.  Continue with current medication regimen.  Labs ordered today.  Return to clinic in 3 months for reevaluation.  Call sooner if concerns arise.   

## 2021-03-01 NOTE — Assessment & Plan Note (Signed)
Chronic, ongoing with rate-controlled at this time.  Continue current medication regimen and adjust as needed.  Patient declines going to Cardiology.  Will reassess at future visits. Labs today.  Follow up in 3 months.  Call sooner if concerns arise.

## 2021-03-02 LAB — COMPREHENSIVE METABOLIC PANEL
ALT: 37 IU/L (ref 0–44)
AST: 46 IU/L — ABNORMAL HIGH (ref 0–40)
Albumin/Globulin Ratio: 1.9 (ref 1.2–2.2)
Albumin: 4 g/dL (ref 3.6–4.6)
Alkaline Phosphatase: 98 IU/L (ref 44–121)
BUN/Creatinine Ratio: 13 (ref 10–24)
BUN: 18 mg/dL (ref 8–27)
Bilirubin Total: 0.3 mg/dL (ref 0.0–1.2)
CO2: 19 mmol/L — ABNORMAL LOW (ref 20–29)
Calcium: 8.4 mg/dL — ABNORMAL LOW (ref 8.6–10.2)
Chloride: 105 mmol/L (ref 96–106)
Creatinine, Ser: 1.44 mg/dL — ABNORMAL HIGH (ref 0.76–1.27)
Globulin, Total: 2.1 g/dL (ref 1.5–4.5)
Glucose: 91 mg/dL (ref 65–99)
Potassium: 4.3 mmol/L (ref 3.5–5.2)
Sodium: 137 mmol/L (ref 134–144)
Total Protein: 6.1 g/dL (ref 6.0–8.5)
eGFR: 47 mL/min/{1.73_m2} — ABNORMAL LOW (ref >59)

## 2021-03-02 LAB — CBC WITH DIFFERENTIAL/PLATELET
Basophils Absolute: 0.1 10*3/uL (ref 0.0–0.2)
Basos: 1 %
EOS (ABSOLUTE): 0.4 10*3/uL (ref 0.0–0.4)
Eos: 5 %
Hematocrit: 38.2 % (ref 37.5–51.0)
Hemoglobin: 12.1 g/dL — ABNORMAL LOW (ref 13.0–17.7)
Immature Grans (Abs): 0.1 10*3/uL (ref 0.0–0.1)
Immature Granulocytes: 1 %
Lymphocytes Absolute: 1.3 10*3/uL (ref 0.7–3.1)
Lymphs: 17 %
MCH: 26.2 pg — ABNORMAL LOW (ref 26.6–33.0)
MCHC: 31.7 g/dL (ref 31.5–35.7)
MCV: 83 fL (ref 79–97)
Monocytes Absolute: 1 10*3/uL — ABNORMAL HIGH (ref 0.1–0.9)
Monocytes: 13 %
Neutrophils Absolute: 5.1 10*3/uL (ref 1.4–7.0)
Neutrophils: 63 %
Platelets: 277 10*3/uL (ref 150–450)
RBC: 4.61 x10E6/uL (ref 4.14–5.80)
RDW: 14.9 % (ref 11.6–15.4)
WBC: 7.9 10*3/uL (ref 3.4–10.8)

## 2021-03-02 LAB — LIPID PANEL
Chol/HDL Ratio: 2.6 ratio (ref 0.0–5.0)
Cholesterol, Total: 140 mg/dL (ref 100–199)
HDL: 53 mg/dL (ref >39)
LDL Chol Calc (NIH): 71 mg/dL (ref 0–99)
Triglycerides: 82 mg/dL (ref 0–149)
VLDL Cholesterol Cal: 16 mg/dL (ref 5–40)

## 2021-03-02 NOTE — Progress Notes (Signed)
Pleas elet patient knwo that his lab work looks good.  Kidney function improved some. Anemia has also improved some and cholesterol looks good.  Follow up as discussed.

## 2021-03-31 ENCOUNTER — Telehealth: Payer: Self-pay

## 2021-03-31 NOTE — Chronic Care Management (AMB) (Signed)
    Chronic Care Management Pharmacy Assistant   Name: Caleb Fleming.  MRN: [id number] DOB: 19-Oct-1931   Reason for Encounter: Disease State General Assessment Call   Recent office visits:  03/01/21 Caleb Grooms NP - Seen for hypertension - Labs ordered -No medication changes noted - Follow up in 3 months  11/29/20 Caleb Grooms NP - Seen for heart failure with reduced ejection fraction - Labs ordered - Follow up in 3 months  10/26/20 Caleb Fleming - Family medicine - Seen for hypertension - No other notes available  09/29/20 Caleb Grooms NP - Seen for hypertension - No medication changes noted - Follow up in 2 months    Recent consult visits:  None noted   Hospital visits:  None in previous 6 months  Medications: Outpatient Encounter Medications as of 03/31/2021  Medication Sig   acetaminophen (TYLENOL) 325 MG tablet Take 650 mg by mouth every 4 (four) hours as needed.   allopurinol (ZYLOPRIM) 100 MG tablet Take 1 tablet (100 mg total) by mouth daily.   amiodarone (PACERONE) 200 MG tablet Take 1 tablet (200 mg total) by mouth daily.   apixaban (ELIQUIS) 5 MG TABS tablet Take 1 tablet (5 mg total) by mouth 2 (two) times daily.   metoprolol succinate (TOPROL-XL) 25 MG 24 hr tablet Take 1 tablet (25 mg total) by mouth daily.   potassium chloride (KLOR-CON) 10 MEQ tablet TAKE 1 TABLET (10 MEQ TOTAL) BY MOUTH 2 (TWO) TIMES DAILY.   simvastatin (ZOCOR) 20 MG tablet Take 1 tablet (20 mg total) by mouth daily at 6 PM.   zinc sulfate 220 (50 Zn) MG capsule Take 1 capsule (220 mg total) by mouth daily.   No facility-administered encounter medications on file as of 03/31/2021.    Care Gaps: None noted    Have you had any problems recently with your health? Patient's daughter in law Caleb Fleming reports that patient is not sleeping well after discontinuing Ambien medication. She would like to discuss restarting medication or an alternative.   Have you had any problems with  your pharmacy? Patient's daughter in law Caleb Fleming reports no issues with pharmacy   What issues or side effects are you having with your medications? Patient's daughter in law Caleb Fleming reports no issues with medication side effects.   What would you like me to pass along to Caleb Fleming,CPP for them to help you with?  Patient's daughter in law Caleb Fleming, states that patient's Eliquis is very expensive. She would like to see if she can get patient assistance for this. Per pharmacist request, scheduled an appointment for 04/17/21 @10  am via telephone.   What can we do to take care of you better?  Patient's daughter in law Caleb Fleming would like to also discuss restarting sleep medication and PAP for eliquis.   Star Rating Drugs: None    , CMA

## 2021-04-10 ENCOUNTER — Encounter: Payer: Self-pay | Admitting: Nurse Practitioner

## 2021-04-10 ENCOUNTER — Ambulatory Visit (INDEPENDENT_AMBULATORY_CARE_PROVIDER_SITE_OTHER): Payer: Medicare Other | Admitting: Nurse Practitioner

## 2021-04-10 ENCOUNTER — Ambulatory Visit: Payer: Self-pay

## 2021-04-10 ENCOUNTER — Other Ambulatory Visit: Payer: Self-pay

## 2021-04-10 VITALS — BP 144/67 | HR 46 | Ht 64.0 in | Wt 158.0 lb

## 2021-04-10 DIAGNOSIS — N3001 Acute cystitis with hematuria: Secondary | ICD-10-CM | POA: Diagnosis not present

## 2021-04-10 DIAGNOSIS — R319 Hematuria, unspecified: Secondary | ICD-10-CM | POA: Diagnosis not present

## 2021-04-10 LAB — URINALYSIS, ROUTINE W REFLEX MICROSCOPIC: Specific Gravity, UA: 1.02 (ref 1.005–1.030)

## 2021-04-10 LAB — MICROSCOPIC EXAMINATION: RBC, Urine: >30 /hpf — ABNORMAL HIGH (ref 0–2)

## 2021-04-10 MED ORDER — NITROFURANTOIN MONOHYD MACRO 100 MG PO CAPS: 100.0000 mg | ORAL_CAPSULE | Freq: Two times a day (BID) | ORAL | 0 refills | Status: DC

## 2021-04-10 NOTE — Progress Notes (Signed)
BP (!) 144/67   Pulse (!) 46   Ht '5\' 4"'  (1.626 m)   Wt 158 lb (71.7 kg)   BMI 27.12 kg/m    Subjective:    Patient ID: Caleb Platt., male    DOB: 1932/01/08, 87 y.o.   MRN: [id number]  HPI: Caleb Fleming is a 87 y.o. male  Chief Complaint  Patient presents with   Hematuria   HEMATURIA Patient presents to clinic with complaints of blood in his urine.  Patient states the blood started a couple of days ago.  But he really noticed it yesterday.  Denies pain with urination, fever, denies fatigue, flank pain, abdominal pain.  States he is going to the bathroom more often.    Relevant past medical, surgical, family and social history reviewed and updated as indicated. Interim medical history since our last visit reviewed. Allergies and medications reviewed and updated.  Review of Systems  Constitutional:  Negative for fatigue.  Gastrointestinal:  Negative for abdominal pain.  Genitourinary:  Positive for frequency and hematuria. Negative for dysuria and flank pain.  Musculoskeletal:  Negative for back pain.   Per HPI unless specifically indicated above     Objective:    BP (!) 144/67   Pulse (!) 46   Ht '5\' 4"'  (1.626 m)   Wt 158 lb (71.7 kg)   BMI 27.12 kg/m   Wt Readings from Last 3 Encounters:  04/10/21 158 lb (71.7 kg)  03/01/21 158 lb (71.7 kg)  11/29/20 161 lb 12.8 oz (73.4 kg)    Physical Exam Vitals and nursing note reviewed.  Constitutional:      General: He is not in acute distress.    Appearance: Normal appearance. He is not ill-appearing, toxic-appearing or diaphoretic.  HENT:     Head: Normocephalic.     Right Ear: External ear normal.     Left Ear: External ear normal.     Nose: Nose normal. No congestion or rhinorrhea.     Mouth/Throat:     Mouth: Mucous membranes are moist.  Eyes:     General:        Right eye: No discharge.        Left eye: No discharge.     Extraocular Movements: Extraocular movements intact.      Conjunctiva/sclera: Conjunctivae normal.     Pupils: Pupils are equal, round, and reactive to light.  Cardiovascular:     Rate and Rhythm: Normal rate and regular rhythm.     Heart sounds: No murmur heard. Pulmonary:     Effort: Pulmonary effort is normal. No respiratory distress.     Breath sounds: Normal breath sounds. No wheezing, rhonchi or rales.  Abdominal:     General: Abdomen is flat. Bowel sounds are normal. There is no distension.     Palpations: Abdomen is soft.     Tenderness: There is no abdominal tenderness. There is no right CVA tenderness, left CVA tenderness or guarding.  Musculoskeletal:     Cervical back: Normal range of motion and neck supple.  Skin:    General: Skin is warm and dry.     Capillary Refill: Capillary refill takes less than 2 seconds.  Neurological:     General: No focal deficit present.     Mental Status: He is alert and oriented to person, place, and time.  Psychiatric:        Mood and Affect: Mood normal.        Behavior: Behavior  normal.        Thought Content: Thought content normal.        Judgment: Judgment normal.    Results for orders placed or performed in visit on 03/01/21  Comp Met (CMET)  Result Value Ref Range   Glucose 91 65 - 99 mg/dL   BUN 18 8 - 27 mg/dL   Creatinine, Ser 1.44 (H) 0.76 - 1.27 mg/dL   eGFR 47 (L) >59 mL/min/1.73   BUN/Creatinine Ratio 13 10 - 24   Sodium 137 134 - 144 mmol/L   Potassium 4.3 3.5 - 5.2 mmol/L   Chloride 105 96 - 106 mmol/L   CO2 19 (L) 20 - 29 mmol/L   Calcium 8.4 (L) 8.6 - 10.2 mg/dL   Total Protein 6.1 6.0 - 8.5 g/dL   Albumin 4.0 3.6 - 4.6 g/dL   Globulin, Total 2.1 1.5 - 4.5 g/dL   Albumin/Globulin Ratio 1.9 1.2 - 2.2   Bilirubin Total 0.3 0.0 - 1.2 mg/dL   Alkaline Phosphatase 98 44 - 121 IU/L   AST 46 (H) 0 - 40 IU/L   ALT 37 0 - 44 IU/L  CBC w/Diff  Result Value Ref Range   WBC 7.9 3.4 - 10.8 x10E3/uL   RBC 4.61 4.14 - 5.80 x10E6/uL   Hemoglobin 12.1 (L) 13.0 - 17.7 g/dL    Hematocrit 38.2 37.5 - 51.0 %   MCV 83 79 - 97 fL   MCH 26.2 (L) 26.6 - 33.0 pg   MCHC 31.7 31.5 - 35.7 g/dL   RDW 14.9 11.6 - 15.4 %   Platelets 277 150 - 450 x10E3/uL   Neutrophils 63 Not Estab. %   Lymphs 17 Not Estab. %   Monocytes 13 Not Estab. %   Eos 5 Not Estab. %   Basos 1 Not Estab. %   Neutrophils Absolute 5.1 1.4 - 7.0 x10E3/uL   Lymphocytes Absolute 1.3 0.7 - 3.1 x10E3/uL   Monocytes Absolute 1.0 (H) 0.1 - 0.9 x10E3/uL   EOS (ABSOLUTE) 0.4 0.0 - 0.4 x10E3/uL   Basophils Absolute 0.1 0.0 - 0.2 x10E3/uL   Immature Granulocytes 1 Not Estab. %   Immature Grans (Abs) 0.1 0.0 - 0.1 x10E3/uL  Lipid Profile  Result Value Ref Range   Cholesterol, Total 140 100 - 199 mg/dL   Triglycerides 82 0 - 149 mg/dL   HDL 53 >39 mg/dL   VLDL Cholesterol Cal 16 5 - 40 mg/dL   LDL Chol Calc (NIH) 71 0 - 99 mg/dL   Chol/HDL Ratio 2.6 0.0 - 5.0 ratio      Assessment & Plan:   Problem List Items Addressed This Visit   None Visit Diagnoses     Acute cystitis with hematuria    -  Primary   Macrobid sent to the pharmacy. Complete course of antibiotics. Will send for culture and change antibiotic if necessary. Discussed reasons to follow up.   Relevant Orders   Urine Culture   Hematuria, unspecified type       Relevant Orders   Urinalysis, Routine w reflex microscopic        Follow up plan: Return if symptoms worsen or fail to improve.

## 2021-04-10 NOTE — Telephone Encounter (Signed)
Reason for Disposition  Urinating more frequently than usual (i.e., frequency)  Answer Assessment - Initial Assessment Questions 1. SYMPTOM: "What's the main symptom you're concerned about?" (e.g., frequency, incontinence)     Blood urine 2. ONSET: "When did the  Blood start?"     Yesterday 3. PAIN: "Is there any pain?" If Yes, ask: "How bad is it?" (Scale: 1-10; mild, moderate, severe)     No 4. CAUSE: "What do you think is causing the symptoms?"     Unsure 5. OTHER SYMPTOMS: "Do you have any other symptoms?" (e.g., fever, flank pain, blood in urine, pain with urination)     No 6. PREGNANCY: "Is there any chance you are pregnant?" "When was your last menstrual period?"     N/A  Protocols used: Urinary Symptoms-A-AH

## 2021-04-10 NOTE — Progress Notes (Signed)
Results discussed with patient during visit.

## 2021-04-10 NOTE — Telephone Encounter (Signed)
Daughter-in-law states pt. Started seeing blood in urine yesterday. No other symptoms. Appointment for today.

## 2021-04-14 LAB — URINE CULTURE

## 2021-04-14 NOTE — Addendum Note (Signed)
Addended by: Larae Grooms on: 04/14/2021 12:17 PM   Modules accepted: Orders

## 2021-04-14 NOTE — Progress Notes (Signed)
Please call patient's daughter, [name], and let her know that his urine did not grow any bacteria which was surprising due to the amount that was in his urinalysis. I would like him to repeat the urinalysis as a lab visit to make sure the blood has cleared and there is no bacteria.  I have placed the order.  He should come back one week after completing the antibiotics given at his appointment.

## 2021-04-17 ENCOUNTER — Telehealth: Payer: Self-pay

## 2021-04-17 ENCOUNTER — Telehealth: Payer: Medicare Other

## 2021-04-17 DIAGNOSIS — N3001 Acute cystitis with hematuria: Secondary | ICD-10-CM

## 2021-04-17 NOTE — Telephone Encounter (Signed)
Informed patients daughter of results and recommendations.  Appointment scheduled.

## 2021-04-17 NOTE — Telephone Encounter (Signed)
-----   Message from Larae Grooms, NP sent at 04/14/2021 12:17 PM EDT ----- Please call patient's daughter, [name], and let her know that his urine did not grow any bacteria which was surprising due to the amount that was in his urinalysis. I would like him to repeat the urinalysis as a lab visit to make sure the blood has cleared and there is no bacteria.  I have placed the order.  He should come back one week after completing the antibiotics given at his appointment.

## 2021-04-19 ENCOUNTER — Telehealth: Payer: Medicare Other | Admitting: General Practice

## 2021-04-19 ENCOUNTER — Ambulatory Visit (INDEPENDENT_AMBULATORY_CARE_PROVIDER_SITE_OTHER): Payer: Medicare Other

## 2021-04-19 DIAGNOSIS — I480 Paroxysmal atrial fibrillation: Secondary | ICD-10-CM

## 2021-04-19 DIAGNOSIS — I10 Essential (primary) hypertension: Secondary | ICD-10-CM

## 2021-04-19 DIAGNOSIS — N3001 Acute cystitis with hematuria: Secondary | ICD-10-CM

## 2021-04-19 DIAGNOSIS — I5020 Unspecified systolic (congestive) heart failure: Secondary | ICD-10-CM

## 2021-04-19 NOTE — Chronic Care Management (AMB) (Signed)
Chronic Care Management   CCM RN Visit Note  04/19/2021 Name: Caleb Fleming. MRN: [id number] DOB: 24-Sep-1931  Subjective: Caleb Fleming. is a 87 y.o. year old male who is a primary care patient of Jon Billings, NP. The care management team was consulted for assistance with disease management and care coordination needs.    Engaged with patient by telephone for follow up visit in response to provider referral for case management and/or care coordination services.   Consent to Services:  The patient was given information about Chronic Care Management services, agreed to services, and gave verbal consent prior to initiation of services.  Please see initial visit note for detailed documentation.   Patient agreed to services and verbal consent obtained.   Assessment: Review of patient past medical history, allergies, medications, health status, including review of consultants reports, laboratory and other test data, was performed as part of comprehensive evaluation and provision of chronic care management services.   SDOH (Social Determinants of Health) assessments and interventions performed:    CCM Care Plan  Allergies  Allergen Reactions   Bactrim [Sulfamethoxazole-Trimethoprim] Itching   Codeine     Outpatient Encounter Medications as of 04/19/2021  Medication Sig   acetaminophen (TYLENOL) 325 MG tablet Take 650 mg by mouth every 4 (four) hours as needed.   allopurinol (ZYLOPRIM) 100 MG tablet Take 1 tablet (100 mg total) by mouth daily.   amiodarone (PACERONE) 200 MG tablet Take 1 tablet (200 mg total) by mouth daily.   apixaban (ELIQUIS) 5 MG TABS tablet Take 1 tablet (5 mg total) by mouth 2 (two) times daily.   metoprolol succinate (TOPROL-XL) 25 MG 24 hr tablet Take 1 tablet (25 mg total) by mouth daily.   nitrofurantoin, macrocrystal-monohydrate, (MACROBID) 100 MG capsule Take 1 capsule (100 mg total) by mouth 2 (two) times daily.   potassium chloride  (KLOR-CON) 10 MEQ tablet TAKE 1 TABLET (10 MEQ TOTAL) BY MOUTH 2 (TWO) TIMES DAILY.   simvastatin (ZOCOR) 20 MG tablet Take 1 tablet (20 mg total) by mouth daily at 6 PM.   zinc sulfate 220 (50 Zn) MG capsule Take 1 capsule (220 mg total) by mouth daily.   No facility-administered encounter medications on file as of 04/19/2021.    Patient Active Problem List   Diagnosis Date Noted   Aortic atherosclerosis ([place]) 10/04/2020   Laceration of scalp 07/21/2020   HFrEF (heart failure with reduced ejection fraction) ([place]) 05/18/2020   CKD (chronic kidney disease), stage III ([place]) 05/18/2020   Senile purpura ([place]) 05/18/2020   AF (paroxysmal atrial fibrillation) ([place]) 02/11/2020   History of COVID-19 02/11/2020   Alcohol abuse 12/25/2018   Benign prostatic hyperplasia 05/26/2015   Gout 05/26/2015   Hyperlipidemia 05/26/2015   Insomnia 05/26/2015   Hypogonadism in male 05/26/2015   Hypertension 05/26/2015   Anxiety and depression 05/26/2015    Conditions to be addressed/monitored:Atrial Fibrillation, CHF, HTN, and recent UTI  Care Plan : RNCM: Heart Failure (Adult)  Updates made by Vanita Ingles, RN since 04/19/2021 12:00 AM     Problem: RNCM: Symptom Exacerbation (Heart Failure)   Priority: Medium     Long-Range Goal: RNCM: Symptom Exacerbation Prevented or Minimized   Start Date: 10/26/2020  Expected End Date: 10/26/2021  This Visit's Progress: On track  Recent Progress: On track  Priority: Medium  Note:   Current Barriers:  Knowledge deficits related to basic heart failure pathophysiology and self care management Lacks social connections Does not maintain contact  with provider office Does not contact provider office for questions/concerns Lack of scale in home Financial strain EF% 35-40% Nurse Case Manager Clinical Goal(s):  patient will weigh self daily and record patient will verbalize understanding of Heart Failure Action Plan and when to call doctor patient will take  all Heart Failure mediations as prescribed Interventions:  Collaboration with Jon Billings, NP regarding development and update of comprehensive plan of care as evidenced by provider attestation and co-signature Inter-disciplinary care team collaboration (see longitudinal plan of care) Basic overview and discussion of pathophysiology of Heart Failure. 02-15-2021: The patient DIL helps manage the patients medications. He is compliant with medications at this time. States he is not having any swelling in his feet or legs. She is monitoring his meals and does not add salt to his foods. She states compliance with a heart healthy diet.  Provided written and verbal education on low sodium diet. 04-19-2021: Education and review. The patient is compliant with a heart healthy diet. The DIL closely monitors his dietary habits.  Reviewed Heart Failure Action Plan in depth and provided written copy Assessed for scales in home- has scales but does not weigh daily Discussed importance of daily weight Reviewed role of diuretics in prevention of fluid overload Self-Care Activities:  Takes Heart Failure Medications as prescribed Weighs daily and record (notifying MD of 3 lb weight gain over night or 5 lb in a week) Verbalizes understanding of and follows CHF Action Plan Adheres to low sodium diet  Patient Goals:  - Take Heart Failure Medications as prescribed - Weigh daily and record (notify MD with 3 lb weight gain over night or 5 lb in a week) - Follow CHF Action Plan - Adhere to low sodium diet - eat more whole grains, fruits and vegetables, lean meats and healthy fats - follow rescue plan if symptoms flare-up - know when to call the doctor - track symptoms and what helps feel better or worse - dress right for the weather, hot or cold - avoid heavy exercise on very hot days - drink water to stay hydrated during exercise - follow activity or exercise plan - make an activity or exercise plan - pace  activity allowing for rest - warm up and cool down for 10 minutes before and after exercise - call office if I gain more than 2 pounds in one day or 5 pounds in one week - keep legs up while sitting - use salt in moderation - watch for swelling in feet, ankles and legs every day - weigh myself daily barriers to lifestyle changes reviewed and addressed - barriers to treatment reviewed and addressed - cognitive screening completed and reviewed - depression screen reviewed - health literacy screening completed or reviewed - healthy lifestyle promoted - medication-adherence assessment completed - rescue (action) plan developed - rescue (action) plan reviewed - self-awareness of signs/symptoms of worsening disease encouraged    Notes: lives with son and daughter in law who help manage his care Follow Up Plan: Telephone follow up appointment with care management team member scheduled for: 06-21-2021 at 1145 am    Care Plan : RNCM: Hypertension (Adult)  Updates made by Vanita Ingles, RN since 04/19/2021 12:00 AM     Problem: RNCM: Hypertension (Hypertension)   Priority: Medium     Long-Range Goal: RNCM: Hypertension Monitored   Start Date: 10/26/2020  Expected End Date: 10/26/2021  This Visit's Progress: On track  Recent Progress: On track  Priority: Medium  Note:   Objective:  Last practice recorded BP readings:  BP Readings from Last 3 Encounters:  04/10/21 (!) 144/67  03/01/21 130/78  11/29/20 124/63   Most recent eGFR/CrCl: No results found for: EGFR  No components found for: CRCL Current Barriers:  Knowledge Deficits related to basic understanding of hypertension pathophysiology and self care management Knowledge Deficits related to understanding of medications prescribed for management of hypertension Limited Social Support Unable to independently manage HTN Unable to self administer medications as prescribed Lacks social connections Does not maintain contact with  provider office Does not contact provider office for questions/concerns Case Manager Clinical Goal(s):  patient will verbalize understanding of plan for hypertension management patient will attend all scheduled medical appointments: 06-05-2021 at 10:40am patient will demonstrate improved adherence to prescribed treatment plan for hypertension as evidenced by taking all medications as prescribed, monitoring and recording blood pressure as directed, adhering to low sodium/DASH diet patient will demonstrate improved health management independence as evidenced by checking blood pressure as directed and notifying PCP if SBP>160 or DBP > 90, taking all medications as prescribe, and adhering to a low sodium diet as discussed. patient will verbalize basic understanding of hypertension disease process and self health management plan as evidenced by compliance with medications, compliance with heart healthy diet, and working with CCM team for management of health and well being.  Interventions:  Collaboration with Jon Billings, NP regarding development and update of comprehensive plan of care as evidenced by provider attestation and co-signature Inter-disciplinary care team collaboration (see longitudinal plan of care) Evaluation of current treatment plan related to hypertension self management and patient's adherence to plan as established by provider. 02-15-2021: Spoke to the patients DIL [name], she states the patient is doing well. Only thing new right now is he is having a gout flare up in his foot. She has his medications refilled and reviewed dietary restrictions to follow. Advised the DIL to call the office for unresolved gout flare up. The patient has stable blood pressures. Was asleep at the time of the call. DIL states he has been doing well. Has follow up with pcp on 03-01-2021. 04-19-2021: The patient is recovering from a UTI. The patient will have a follow up UA on 04-24-2021 for evaluation and  further recommendations if needed. The patients DIL states he is compliant with his medications and eating well. Did have some elevations of his blood pressure but feels that was due to UTI and not feeling his usual self. Will continue to monitor.  Provided education to patient re: stroke prevention, s/s of heart attack and stroke, DASH diet, complications of uncontrolled blood pressure. 04-19-2021: Review of dietary restrictions and staying hydrated. The patient drinks a lot of water. The patient is compliant with a heart healthy diet.  Reviewed medications with patient and discussed importance of compliance. 02-15-2021: Refills picked up today. Denies any issues with medications compliance. 04-19-2021: Is compliant with medications regimen  Discussed plans with patient for ongoing care management follow up and provided patient with direct contact information for care management team Advised patient, providing education and rationale, to monitor blood pressure daily and record, calling PCP for findings outside established parameters.  Reviewed scheduled/upcoming provider appointments including: 8-24-202 at 1040 am Self-Care Activities: - Self administers medications as prescribed Attends all scheduled provider appointments Calls provider office for new concerns, questions, or BP outside discussed parameters Checks BP and records as discussed Follows a low sodium diet/DASH diet Patient Goals: - check blood pressure weekly - choose a place to take  my blood pressure (home, clinic or office, retail store) - write blood pressure results in a log or diary - agree on reward when goals are met - agree to work together to make changes - ask questions to understand - have a family meeting to talk about healthy habits - learn about high blood pressure - blood pressure trends reviewed - depression screen reviewed - home or ambulatory blood pressure monitoring encouraged  Follow Up Plan: Telephone follow  up appointment with care management team member scheduled for: 06-21-2021 at 11:45 am    Care Plan : RNCM: Atrial Fibrillation (Adult)  Updates made by Vanita Ingles, RN since 04/19/2021 12:00 AM     Problem: RNCM: Dysrhythmia (Atrial Fibrillation)   Priority: Medium     Long-Range Goal: RNCM: Heart Rate and Rhythm Monitored and Managed/AFIB   Start Date: 10/26/2020  Expected End Date: 10/28/2021  This Visit's Progress: On track  Recent Progress: On track  Priority: Medium  Note:   Current Barriers:  Knowledge deficits related to self health management of chronic afib Chronic Disease Management support and education needs related to effective management of AFIB Lacks caregiver support.  Unable to independently manage AFIB Lacks social connections Does not maintain contact with provider office Does not contact provider office for questions/concerns Nurse Case Manager Clinical Goal(s):  patient will take all medications exactly as prescribed and will call provider for medication related questions patient will verbalize understanding of Afib Action Plan and when to call doctor patient will verbalize understanding of plan for effective management of AFIB patient will work with [facility] and pcp to address needs related to AFIB management  patient will attend all scheduled medical appointments: 06-05-2021 at 10:40 am patient will demonstrate improved adherence to prescribed treatment plan for AFIB  as evidenced byrate controlled, compliance with medications, and working with CCM team to optimize health and well being. Interventions:  Collaboration with Jon Billings, NP regarding development and update of comprehensive plan of care as evidenced by provider attestation and co-signature Inter-disciplinary care team collaboration (see longitudinal plan of care) Basic overview and discussion of afib disease state Medications reviewed. 04-19-2021: Compliant with medications. DIL [name] assist  with medications management  Afib action plan reviewed Evaluation of current treatment plan related to Afib and patient's adherence to plan as established by provider. 04-19-2021: [name] states that the patient is doing well and denies any issue with AFIB exacerbation.  Advised patient to call the office for changes in conditions or questions  Provided education to patient re: sx /sx of uncontrolled heart rate, to monitor for changes in conditions, calling the office for changes in breathing or other issues impacting care of chronic conditions  Reviewed scheduled/upcoming provider appointments including:  Discussed plans with patient for ongoing care management follow up and provided patient with direct contact information for care management team Self-Care Activities: Patient verbalizes understanding of plan to effective management of AFIB Self administers medications as prescribed Attends all scheduled provider appointments Calls pharmacy for medication refills Attends church or other social activities Performs ADL's independently Performs IADL's independently Calls provider office for new concerns or questions Patient Goals: - check pulse (heart) rate once a day - cut down alcohol use - make a plan to eat healthy - keep all lab appointments - stop my alcohol use - take medicine as prescribed - activity or exercise based on tolerance encouraged - barriers to lifestyle changes reviewed and addressed - reassurance provided - rescue (action) plan developed - response to  pharmacologic therapy monitored - screen for functional limitations reviewed - rescue (action) plan use encouraged Follow Up Plan: Telephone follow up appointment with care management team member scheduled for: 06-21-2021 at 1145     Plan:Telephone follow up appointment with care management team member scheduled for:  06-21-2021 at 1145 am  Ashley, MSN, [facility] Mobile: [phone]

## 2021-04-19 NOTE — Patient Instructions (Signed)
Visit Information  PATIENT GOALS:  Goals Addressed             This Visit's Progress    RNCM: Gout flare up to foot       02-15-2021: Per the patient daughter in law [name], the patient has a current gout flare up. She states it is in his foot. The patient was asleep at the time of the call and states she just got his Allopurinol refilled. Endorsees compliance with medications. Discussed foods that can increase gout flare up. Per [name] the patient has decreased his consumption of beer. 04-19-2021: The patient does not have any issues with gout at this time. Is taking ABX for a UTI and will have a repeat UA on 04-24-2021.  The patients daughter in law states that he has not had any further complaints and is doing well. Will continue to monitor.   Patient Goals: -take medications as prescribed -call pcp for increased pain in foot due to gout exacerbation -stay away from foods high in uric acid (purines) - beer, red meats, processed foods     RNCM: Track and Manage My Blood Pressure-Hypertension       Timeframe:  Long-Range Goal Priority:  High Start Date:     10-26-2020                        Expected End Date:     10-26-2021                 Follow Up Date 06-21-2021   - check blood pressure weekly - choose a place to take my blood pressure (home, clinic or office, retail store) - write blood pressure results in a log or diary    Why is this important?   You won't feel high blood pressure, but it can still hurt your blood vessels.  High blood pressure can cause heart or kidney problems. It can also cause a stroke.  Making lifestyle changes like losing a little weight or eating less salt will help.  Checking your blood pressure at home and at different times of the day can help to control blood pressure.  If the doctor prescribes medicine remember to take it the way the doctor ordered.  Call the office if you cannot afford the medicine or if there are questions about it.     Notes:  Daughter in law states they have a blood pressure cuff now. Encouraged her to write down the readings and take to appointments. 04-19-2021: The patient has stable blood pressures. Denies any new concerns with blood pressures. Will continue to monitor for changes. Blood pressures high in the office recently but was dealing with UTI. The daughter in law states he is doing better now. Will continue to monitor.         The patient verbalized understanding of instructions, educational materials, and care plan provided today and declined offer to receive copy of patient instructions, educational materials, and care plan.   Telephone follow up appointment with care management team member scheduled for: 06-21-2021 at 1145 am  [name] RN, MSN, CCM Community Care Coordinator [place]  [facility] Mobile: [phone]

## 2021-04-21 ENCOUNTER — Ambulatory Visit: Payer: Self-pay | Admitting: *Deleted

## 2021-04-21 ENCOUNTER — Other Ambulatory Visit: Payer: Self-pay

## 2021-04-21 ENCOUNTER — Ambulatory Visit (INDEPENDENT_AMBULATORY_CARE_PROVIDER_SITE_OTHER): Payer: Medicare Other | Admitting: Nurse Practitioner

## 2021-04-21 ENCOUNTER — Encounter: Payer: Self-pay | Admitting: Nurse Practitioner

## 2021-04-21 VITALS — BP 126/76 | HR 51 | Temp 97.5°F | Wt 158.2 lb

## 2021-04-21 DIAGNOSIS — R319 Hematuria, unspecified: Secondary | ICD-10-CM

## 2021-04-21 DIAGNOSIS — N3001 Acute cystitis with hematuria: Secondary | ICD-10-CM

## 2021-04-21 LAB — URINALYSIS, ROUTINE W REFLEX MICROSCOPIC: Specific Gravity, UA: <1.005 — ABNORMAL LOW (ref 1.005–1.030)

## 2021-04-21 LAB — MICROSCOPIC EXAMINATION: Epithelial Cells (non renal): NONE SEEN /hpf (ref 0–10)

## 2021-04-21 MED ORDER — CIPROFLOXACIN HCL 500 MG PO TABS: 500.0000 mg | ORAL_TABLET | Freq: Two times a day (BID) | ORAL | 0 refills | Status: AC

## 2021-04-21 NOTE — Progress Notes (Signed)
BP 126/76   Pulse (!) 51   Temp (!) 97.5 F (36.4 C) (Oral)   Wt 158 lb 3.2 oz (71.8 kg)   SpO2 99%   BMI 27.15 kg/m    Subjective:    Patient ID: Caleb Fleming., male    DOB: [birth year], 87 y.o.   MRN: [id number]  HPI: [name] is a 87 y.o. male  Chief Complaint  Patient presents with   Urinary Tract Infection    Pt states he completed recent course of antibiotics, started noticing blood in his urine again about 3 to 3 days ago.    HEMATURIA Patient presents to clinic with complaints of blood in his urine.  Patient states the blood in his urine improved while he was taking the antibiotics the last time then when he completed the course the blood returned.  Patient states he feels fine.  Denies any pain with urination, flank pain, frequency, and fatigue.  Relevant past medical, surgical, family and social history reviewed and updated as indicated. Interim medical history since our last visit reviewed. Allergies and medications reviewed and updated.  Review of Systems  Constitutional:  Negative for fatigue.  Gastrointestinal:  Negative for abdominal pain.  Genitourinary:  Positive for hematuria. Negative for dysuria, flank pain and frequency.  Musculoskeletal:  Negative for back pain.   Per HPI unless specifically indicated above     Objective:    BP 126/76   Pulse (!) 51   Temp (!) 97.5 F (36.4 C) (Oral)   Wt 158 lb 3.2 oz (71.8 kg)   SpO2 99%   BMI 27.15 kg/m   Wt Readings from Last 3 Encounters:  04/21/21 158 lb 3.2 oz (71.8 kg)  04/10/21 158 lb (71.7 kg)  03/01/21 158 lb (71.7 kg)    Physical Exam Vitals and nursing note reviewed.  Constitutional:      General: He is not in acute distress.    Appearance: Normal appearance. He is not ill-appearing, toxic-appearing or diaphoretic.  HENT:     Head: Normocephalic.     Right Ear: External ear normal.     Left Ear: External ear normal.     Nose: Nose normal. No congestion or rhinorrhea.      Mouth/Throat:     Mouth: Mucous membranes are moist.  Eyes:     General:        Right eye: No discharge.        Left eye: No discharge.     Extraocular Movements: Extraocular movements intact.     Conjunctiva/sclera: Conjunctivae normal.     Pupils: Pupils are equal, round, and reactive to light.  Cardiovascular:     Rate and Rhythm: Normal rate and regular rhythm.     Heart sounds: No murmur heard. Pulmonary:     Effort: Pulmonary effort is normal. No respiratory distress.     Breath sounds: Normal breath sounds. No wheezing, rhonchi or rales.  Abdominal:     General: Abdomen is flat. Bowel sounds are normal. There is no distension.     Palpations: Abdomen is soft.     Tenderness: There is no abdominal tenderness. There is no right CVA tenderness, left CVA tenderness or guarding.  Musculoskeletal:     Cervical back: Normal range of motion and neck supple.  Skin:    General: Skin is warm and dry.     Capillary Refill: Capillary refill takes less than 2 seconds.  Neurological:     General: No  focal deficit present.     Mental Status: He is alert and oriented to person, place, and time.  Psychiatric:        Mood and Affect: Mood normal.        Behavior: Behavior normal.        Thought Content: Thought content normal.        Judgment: Judgment normal.    Results for orders placed or performed in visit on 04/10/21  Urine Culture   Specimen: Urine   UR  Result Value Ref Range   Urine Culture, Routine Final report    Organism ID, Bacteria Comment   Microscopic Examination   Urine  Result Value Ref Range   WBC, UA 11-30 (A) 0 - 5 /hpf   RBC >30 (H) 0 - 2 /hpf   Epithelial Cells (non renal) 0-10 0 - 10 /hpf   Mucus, UA Present (A) Not Estab.   Bacteria, UA Many (A) None seen/Few  Urinalysis, Routine w reflex microscopic  Result Value Ref Range   Specific Gravity, UA 1.020 1.005 - 1.030   pH, UA CANCELED    Color, UA Red (A) Yellow   Appearance Ur Cloudy (A) Clear    Protein,UA CANCELED    Glucose, UA CANCELED    Ketones, UA CANCELED    Microscopic Examination See below:       Assessment & Plan:   Problem List Items Addressed This Visit       Other   Hematuria - Primary    Ongoing.  Will treat patient with Ciprofloxacin since symptoms improved after last visit with Macrobid.  Will send urine culture.  Stop taking Eliquis.  Will refer patient to Urology.  He is a current everyday smoker.      Relevant Orders   Urinalysis, Routine w reflex microscopic     Follow up plan: No follow-ups on file.

## 2021-04-21 NOTE — Assessment & Plan Note (Signed)
Ongoing.  Will treat patient with Ciprofloxacin since symptoms improved after last visit with Macrobid.  Will send urine culture.  Stop taking Eliquis.  Will refer patient to Urology.  He is a current everyday smoker.

## 2021-04-21 NOTE — Telephone Encounter (Signed)
Reason for Disposition  Taking Coumadin (warfarin) or other strong blood thinner, or known bleeding disorder (e.g., thrombocytopenia)  Answer Assessment - Initial Assessment Questions 1. COLOR of URINE: "Describe the color of the urine."  (e.g., tea-colored, pink, red, blood clots, bloody)     Dark and black- just started back after treatment 2. ONSET: "When did the bleeding start?"      Started yesterday 3. EPISODES: "How many times has there been blood in the urine?" or "How many times today?"     Every time urinates 4. PAIN with URINATION: "Is there any pain with passing your urine?" If Yes, ask: "How bad is the pain?"  (Scale 1-10; or mild, moderate, severe)    - MILD - complains slightly about urination hurting    - MODERATE - interferes with normal activities      - SEVERE - excruciating, unwilling or unable to urinate because of the pain      No pain 5. FEVER: "Do you have a fever?" If Yes, ask: "What is your temperature, how was it measured, and when did it start?"     no 6. ASSOCIATED SYMPTOMS: "Are you passing urine more frequently than usual?"     same 7. OTHER SYMPTOMS: "Do you have any other symptoms?" (e.g., back/flank pain, abdominal pain, vomiting)     no 8. PREGNANCY: "Is there any chance you are pregnant?" "When was your last menstrual period?"     N/a  Protocols used: Urine - Blood In-A-AH

## 2021-04-21 NOTE — Telephone Encounter (Signed)
Patient and daughter in law- [name] on call- patient was recently treated for UTI- and he states he was clear while taking medication- but when finished the blood in urine reappeared. Patient is also on blood thinner- so appointment has been scheduled per protocol. Patient reports no other symptoms.

## 2021-04-24 ENCOUNTER — Ambulatory Visit (INDEPENDENT_AMBULATORY_CARE_PROVIDER_SITE_OTHER): Payer: Medicare Other

## 2021-04-24 ENCOUNTER — Telehealth: Payer: Medicare Other

## 2021-04-24 ENCOUNTER — Other Ambulatory Visit: Payer: Medicare Other

## 2021-04-24 VITALS — Ht 69.0 in | Wt 150.0 lb

## 2021-04-24 DIAGNOSIS — Z0000 Encounter for general adult medical examination without abnormal findings: Secondary | ICD-10-CM

## 2021-04-24 NOTE — Progress Notes (Signed)
Results discussed with patient during visit. Referral placed for urology and patient was treated with Ciprofloxacin.

## 2021-04-24 NOTE — Progress Notes (Signed)
I connected with Caleb Fleming today by telephone and verified that we were speaking about the correct person using two identifiers. Location patient: home Location provider: work Persons participating in the virtual visit: Caleb Fleming, Caleb Fleming (daughter in law), Caleb Ponder LPN.   I discussed the limitations, risks, security and privacy concerns of performing an evaluation and management service by telephone and the availability of in person appointments. I also discussed with the patient that there may be a patient responsible charge related to this service. The patient expressed understanding and verbally consented to this telephonic visit.    Interactive audio and video telecommunications were attempted between this provider and patient, however failed, due to patient having technical difficulties OR patient did not have access to video capability.  We continued and completed visit with audio only.     Vital signs may be patient reported or missing.  Subjective:   Caleb Fleming. is a 87 y.o. male who presents for an Initial Medicare Annual Wellness Visit.  Review of Systems     Cardiac Risk Factors include: advanced age (>41men, >51 women);dyslipidemia;hypertension;sedentary lifestyle;smoking/ tobacco exposure     Objective:    Today's Vitals   04/24/21 0816  Weight: 150 lb (68 kg)  Height: 5\' 9"  (1.753 m)   Body mass index is 22.15 kg/m.  Advanced Directives 04/24/2021 07/17/2020 02/11/2020 02/10/2020 02/04/2019 12/08/2018 12/08/2018  Does Patient Have a Medical Advance Directive? Yes Unable to assess, patient is non-responsive or altered mental status No No No No No  Type of Advance Directive Healthcare Power of [place];Living will - - - - - -  Copy of Healthcare Power of Attorney in Chart? No - copy requested - - - - - -  Would patient like information on creating a medical advance directive? - - No - Patient declined - No - Patient declined - No - Patient declined     Current Medications (verified) Outpatient Encounter Medications as of 04/24/2021  Medication Sig   acetaminophen (TYLENOL) 325 MG tablet Take 650 mg by mouth every 4 (four) hours as needed.   allopurinol (ZYLOPRIM) 100 MG tablet Take 1 tablet (100 mg total) by mouth daily.   amiodarone (PACERONE) 200 MG tablet Take 1 tablet (200 mg total) by mouth daily.   ciprofloxacin (CIPRO) 500 MG tablet Take 1 tablet (500 mg total) by mouth 2 (two) times daily for 3 days.   metoprolol succinate (TOPROL-XL) 25 MG 24 hr tablet Take 1 tablet (25 mg total) by mouth daily.   potassium chloride (KLOR-CON) 10 MEQ tablet TAKE 1 TABLET (10 MEQ TOTAL) BY MOUTH 2 (TWO) TIMES DAILY.   simvastatin (ZOCOR) 20 MG tablet Take 1 tablet (20 mg total) by mouth daily at 6 PM.   apixaban (ELIQUIS) 5 MG TABS tablet Take 1 tablet (5 mg total) by mouth 2 (two) times daily. (Patient not taking: Reported on 04/24/2021)   zinc sulfate 220 (50 Zn) MG capsule Take 1 capsule (220 mg total) by mouth daily. (Patient not taking: Reported on 04/24/2021)   No facility-administered encounter medications on file as of 04/24/2021.    Allergies (verified) Bactrim [sulfamethoxazole-trimethoprim] and Codeine   History: Past Medical History:  Diagnosis Date   Anxiety    BPH (benign prostatic hypertrophy)    Depression    ED (erectile dysfunction)    Gout    Hyperlipidemia    Hypertension    Insomnia    History reviewed. No pertinent surgical history. Family History  Problem Relation Age  of Onset   Cancer Mother    Cancer Father    Social History   Socioeconomic History   Marital status: Widowed    Spouse name: Not on file   Number of children: Not on file   Years of education: Not on file   Highest education level: Not on file  Occupational History   Not on file  Tobacco Use   Smoking status: Former   Smokeless tobacco: Current    Types: Chew  Vaping Use   Vaping Use: Never used  Substance and Sexual Activity    Alcohol use: Yes    Comment: rarely   Drug use: Never   Sexual activity: Not on file  Other Topics Concern   Not on file  Social History Narrative   Not on file   Social Determinants of Health   Financial Resource Strain: Low Risk    Difficulty of Paying Living Expenses: Not hard at all  Food Insecurity: No Food Insecurity   Worried About Programme researcher, broadcasting/film/video in the Last Year: Never true   Ran Out of Food in the Last Year: Never true  Transportation Needs: No Transportation Needs   Lack of Transportation (Medical): No   Lack of Transportation (Non-Medical): No  Physical Activity: Inactive   Days of Exercise per Week: 0 days   Minutes of Exercise per Session: 0 min  Stress: No Stress Concern Present   Feeling of Stress : Not at all  Social Connections: Socially Isolated   Frequency of Communication with Friends and Family: More than three times a week   Frequency of Social Gatherings with Friends and Family: More than three times a week   Attends Religious Services: Never   Database administrator or Organizations: No   Attends Banker Meetings: Never   Marital Status: Widowed    Tobacco Counseling Ready to quit: Not Answered Counseling given: Not Answered   Clinical Intake:  Pre-visit preparation completed: Yes  Pain : No/denies pain     Nutritional Status: BMI of 19-24  Normal Nutritional Risks: None Diabetes: No  How often do you need to have someone help you when you read instructions, pamphlets, or other written materials from your doctor or pharmacy?: 1 - Never  Diabetic? no  Interpreter Needed?: No  Information entered by :: NAllen LPN   Activities of Daily Living In your present state of health, do you have any difficulty performing the following activities: 04/24/2021  Hearing? Y  Vision? Y  Comment has cataracts  Difficulty concentrating or making decisions? N  Walking or climbing stairs? N  Dressing or bathing? N  Doing  errands, shopping? Y  Preparing Food and eating ? N  Using the Toilet? N  In the past six months, have you accidently leaked urine? Y  Do you have problems with loss of bowel control? N  Managing your Medications? N  Comment daughter in law picks up from pharmacy  Managing your Finances? Y  Housekeeping or managing your Housekeeping? N  Some recent data might be hidden    Patient Care Team: Larae Grooms, NP as PCP - General Arlana Pouch Megan Mans, RN as Case Manager (General Practice)  Indicate any recent Medical Services you may have received from other than [facility] providers in the past year (date may be approximate).     Assessment:   This is a routine wellness examination for Caleb Fleming.  Hearing/Vision screen Vision Screening - Comments:: No regular eye exams, [facility]  Dietary issues and exercise activities discussed: Current Exercise Habits: The patient does not participate in regular exercise at present   Goals Addressed             This Visit's Progress    Patient Stated       04/24/2021, no goals       Depression Screen PHQ 2/9 Scores 04/24/2021 03/01/2021 02/15/2021 07/28/2020 07/21/2020 11/20/2019 08/28/2019  PHQ - 2 Score 0 0 0 0 0 0 0  PHQ- 9 Score - 0 - - - 6 -    Fall Risk Fall Risk  04/24/2021 03/01/2021 07/28/2020 07/21/2020 05/18/2020  Falls in the past year? 0 0 1 1 0  Number falls in past yr: - 0 1 0 0  Injury with Fall? - 0 1 1 0  Risk for fall due to : Medication side effect;Impaired balance/gait No Fall Risks - History of fall(s) No Fall Risks  Follow up Falls evaluation completed;Education provided;Falls prevention discussed - - - Falls evaluation completed    FALL RISK PREVENTION PERTAINING TO THE HOME:  Any stairs in or around the home? Yes  If so, are there any without handrails? No  Home free of loose throw rugs in walkways, pet beds, electrical cords, etc? Yes  Adequate lighting in your home to reduce risk of falls? Yes   ASSISTIVE  DEVICES UTILIZED TO PREVENT FALLS:  Life alert? No  Use of a cane, walker or w/c? No  Grab bars in the bathroom? Yes  Shower chair or bench in shower? No  Elevated toilet seat or a handicapped toilet? Yes   TIMED UP AND GO:  Was the test performed? No .       Cognitive Function:        Immunizations Immunization History  Administered Date(s) Administered   Fluad Quad(high Dose 65+) 04/12/2021   Influenza, High Dose Seasonal PF 05/27/2017, 03/24/2018   Influenza-Unspecified 09/16/2019   PFIZER(Purple Top)SARS-COV-2 Vaccination 01/29/2020   Pneumococcal Conjugate-13 09/23/2019   Pneumococcal Polysaccharide-23 03/13/2006, 05/09/2015   Td 03/13/2006   Tdap 05/09/2015, 07/17/2020   Zoster, Live 02/28/2007    TDAP status: Up to date  Flu Vaccine status: Up to date  Pneumococcal vaccine status: Up to date  Covid-19 vaccine status: Information provided on how to obtain vaccines.   Qualifies for Shingles Vaccine? Yes   Zostavax completed Yes   Shingrix Completed?: No.    Education has been provided regarding the importance of this vaccine. Patient has been advised to call insurance company to determine out of pocket expense if they have not yet received this vaccine. Advised may also receive vaccine at local pharmacy or Health Dept. Verbalized acceptance and understanding.  Screening Tests Health Maintenance  Topic Date Due   COVID-19 Vaccine (2 - Pfizer risk series) 02/19/2020   Zoster Vaccines- Shingrix (1 of 2) 06/01/2021 (Originally 01/15/1951)   TETANUS/TDAP  07/17/2030   INFLUENZA VACCINE  Completed   HPV VACCINES  Aged Out    Health Maintenance  Health Maintenance Due  Topic Date Due   COVID-19 Vaccine (2 - Pfizer risk series) 02/19/2020    Colorectal cancer screening: No longer required.   Lung Cancer Screening: (Low Dose CT Chest recommended if Age 67-80 years, 30 pack-year currently smoking OR have quit w/in 15years.) does not qualify.   Lung Cancer  Screening Referral: no  Additional Screening:  Hepatitis C Screening: does not qualify;   Vision Screening: Recommended annual ophthalmology exams for early detection of glaucoma and other disorders  of the eye. Is the patient up to date with their annual eye exam?  No  Who is the provider or what is the name of the office in which the patient attends annual eye exams? none If pt is not established with a provider, would they like to be referred to a provider to establish care? No .   Dental Screening: Recommended annual dental exams for proper oral hygiene  Community Resource Referral / Chronic Care Management: CRR required this visit?  No   CCM required this visit?  No      Plan:     I have personally reviewed and noted the following in the patient's chart:   Medical and social history Use of alcohol, tobacco or illicit drugs  Current medications and supplements including opioid prescriptions. Patient is not currently taking opioid prescriptions. Functional ability and status Nutritional status Physical activity Advanced directives List of other physicians Hospitalizations, surgeries, and ER visits in previous 12 months Vitals Screenings to include cognitive, depression, and falls Referrals and appointments  In addition, I have reviewed and discussed with patient certain preventive protocols, quality metrics, and best practice recommendations. A written personalized care plan for preventive services as well as general preventive health recommendations were provided to patient.     Barb Merino, LPN   [id number]   Nurse Notes: Questions answered by daughter in law [name].  6 CIT not administered

## 2021-04-24 NOTE — Patient Instructions (Signed)
Caleb Fleming , Thank you for taking time to come for your Medicare Wellness Visit. I appreciate your ongoing commitment to your health goals. Please review the following plan we discussed and let me know if I can assist you in the future.   Screening recommendations/referrals: Colonoscopy: not required Recommended yearly ophthalmology/optometry visit for glaucoma screening and checkup Recommended yearly dental visit for hygiene and checkup  Vaccinations: Influenza vaccine: completed 04/12/2021 Pneumococcal vaccine: completed 09/23/2019 Tdap vaccine: completed 07/17/2020, due 07/17/2030 Shingles vaccine: discussed   Covid-19:  01/29/2020  Advanced directives: Please bring a copy of your POA (Power of Attorney) and/or Living Will to your next appointment.   Conditions/risks identified: tobacco use  Next appointment: Follow up in one year for your annual wellness visit.   Preventive Care 45 Years and Older, Male Preventive care refers to lifestyle choices and visits with your health care provider that can promote health and wellness. What does preventive care include? A yearly physical exam. This is also called an annual well check. Dental exams once or twice a year. Routine eye exams. Ask your health care provider how often you should have your eyes checked. Personal lifestyle choices, including: Daily care of your teeth and gums. Regular physical activity. Eating a healthy diet. Avoiding tobacco and drug use. Limiting alcohol use. Practicing safe sex. Taking low doses of aspirin every day. Taking vitamin and mineral supplements as recommended by your health care provider. What happens during an annual well check? The services and screenings done by your health care provider during your annual well check will depend on your age, overall health, lifestyle risk factors, and family history of disease. Counseling  Your health care provider may ask you questions about your: Alcohol  use. Tobacco use. Drug use. Emotional well-being. Home and relationship well-being. Sexual activity. Eating habits. History of falls. Memory and ability to understand (cognition). Work and work Astronomer. Screening  You may have the following tests or measurements: Height, weight, and BMI. Blood pressure. Lipid and cholesterol levels. These may be checked every 5 years, or more frequently if you are over 39 years old. Skin check. Lung cancer screening. You may have this screening every year starting at age 76 if you have a 30-pack-year history of smoking and currently smoke or have quit within the past 15 years. Fecal occult blood test (FOBT) of the stool. You may have this test every year starting at age 80. Flexible sigmoidoscopy or colonoscopy. You may have a sigmoidoscopy every 5 years or a colonoscopy every 10 years starting at age 5. Prostate cancer screening. Recommendations will vary depending on your family history and other risks. Hepatitis C blood test. Hepatitis B blood test. Sexually transmitted disease (STD) testing. Diabetes screening. This is done by checking your blood sugar (glucose) after you have not eaten for a while (fasting). You may have this done every 1-3 years. Abdominal aortic aneurysm (AAA) screening. You may need this if you are a current or former smoker. Osteoporosis. You may be screened starting at age 68 if you are at high risk. Talk with your health care provider about your test results, treatment options, and if necessary, the need for more tests. Vaccines  Your health care provider may recommend certain vaccines, such as: Influenza vaccine. This is recommended every year. Tetanus, diphtheria, and acellular pertussis (Tdap, Td) vaccine. You may need a Td booster every 10 years. Zoster vaccine. You may need this after age 75. Pneumococcal 13-valent conjugate (PCV13) vaccine. One dose is recommended after  age 60. Pneumococcal polysaccharide  (PPSV23) vaccine. One dose is recommended after age 66. Talk to your health care provider about which screenings and vaccines you need and how often you need them. This information is not intended to replace advice given to you by your health care provider. Make sure you discuss any questions you have with your health care provider. Document Released: 07/22/2015 Document Revised: 03/14/2016 Document Reviewed: 04/26/2015 Elsevier Interactive Patient Education  2017 [place] Prevention in the Home Falls can cause injuries. They can happen to people of all ages. There are many things you can do to make your home safe and to help prevent falls. What can I do on the outside of my home? Regularly fix the edges of walkways and driveways and fix any cracks. Remove anything that might make you trip as you walk through a door, such as a raised step or threshold. Trim any bushes or trees on the path to your home. Use bright outdoor lighting. Clear any walking paths of anything that might make someone trip, such as rocks or tools. Regularly check to see if handrails are loose or broken. Make sure that both sides of any steps have handrails. Any raised decks and porches should have guardrails on the edges. Have any leaves, snow, or ice cleared regularly. Use sand or salt on walking paths during winter. Clean up any spills in your garage right away. This includes oil or grease spills. What can I do in the bathroom? Use night lights. Install grab bars by the toilet and in the tub and shower. Do not use towel bars as grab bars. Use non-skid mats or decals in the tub or shower. If you need to sit down in the shower, use a plastic, non-slip stool. Keep the floor dry. Clean up any water that spills on the floor as soon as it happens. Remove soap buildup in the tub or shower regularly. Attach bath mats securely with double-sided non-slip rug tape. Do not have throw rugs and other things on the  floor that can make you trip. What can I do in the bedroom? Use night lights. Make sure that you have a light by your bed that is easy to reach. Do not use any sheets or blankets that are too big for your bed. They should not hang down onto the floor. Have a firm chair that has side arms. You can use this for support while you get dressed. Do not have throw rugs and other things on the floor that can make you trip. What can I do in the kitchen? Clean up any spills right away. Avoid walking on wet floors. Keep items that you use a lot in easy-to-reach places. If you need to reach something above you, use a strong step stool that has a grab bar. Keep electrical cords out of the way. Do not use floor polish or wax that makes floors slippery. If you must use wax, use non-skid floor wax. Do not have throw rugs and other things on the floor that can make you trip. What can I do with my stairs? Do not leave any items on the stairs. Make sure that there are handrails on both sides of the stairs and use them. Fix handrails that are broken or loose. Make sure that handrails are as long as the stairways. Check any carpeting to make sure that it is firmly attached to the stairs. Fix any carpet that is loose or worn. Avoid having throw rugs  at the top or bottom of the stairs. If you do have throw rugs, attach them to the floor with carpet tape. Make sure that you have a light switch at the top of the stairs and the bottom of the stairs. If you do not have them, ask someone to add them for you. What else can I do to help prevent falls? Wear shoes that: Do not have high heels. Have rubber bottoms. Are comfortable and fit you well. Are closed at the toe. Do not wear sandals. If you use a stepladder: Make sure that it is fully opened. Do not climb a closed stepladder. Make sure that both sides of the stepladder are locked into place. Ask someone to hold it for you, if possible. Clearly mark and make  sure that you can see: Any grab bars or handrails. First and last steps. Where the edge of each step is. Use tools that help you move around (mobility aids) if they are needed. These include: Canes. Walkers. Scooters. Crutches. Turn on the lights when you go into a dark area. Replace any light bulbs as soon as they burn out. Set up your furniture so you have a clear path. Avoid moving your furniture around. If any of your floors are uneven, fix them. If there are any pets around you, be aware of where they are. Review your medicines with your doctor. Some medicines can make you feel dizzy. This can increase your chance of falling. Ask your doctor what other things that you can do to help prevent falls. This information is not intended to replace advice given to you by your health care provider. Make sure you discuss any questions you have with your health care provider. Document Released: 04/21/2009 Document Revised: 12/01/2015 Document Reviewed: 07/30/2014 Elsevier Interactive Patient Education  2017 [employer].

## 2021-04-26 ENCOUNTER — Telehealth: Payer: Self-pay

## 2021-04-26 ENCOUNTER — Telehealth: Payer: Self-pay | Admitting: Nurse Practitioner

## 2021-04-26 LAB — URINE CULTURE

## 2021-04-26 NOTE — Telephone Encounter (Signed)
Copied from CRM [id number]. Topic: Referral - Status >> Apr 25, 2021  3:48 PM Wyonia Hough E wrote: Reason for CRM: Pt is waiting on a Urology referral / please advise asap  Referral placed.

## 2021-04-26 NOTE — Progress Notes (Signed)
Please let patient's daughter, [name], know that his urine did not grow any bacteria again. I have placed the referral to Urology.

## 2021-04-26 NOTE — Telephone Encounter (Signed)
Informed patients daughter [name] of results and recommendations.

## 2021-04-26 NOTE — Telephone Encounter (Signed)
-----   Message from Larae Grooms, NP sent at 04/26/2021 11:05 AM EDT ----- Please let patient's daughter, [name], know that his urine did not grow any bacteria again. I have placed the referral to Urology.

## 2021-04-26 NOTE — Addendum Note (Signed)
Addended by: Larae Grooms on: 04/26/2021 07:53 AM   Modules accepted: Orders

## 2021-05-08 DIAGNOSIS — I480 Paroxysmal atrial fibrillation: Secondary | ICD-10-CM | POA: Diagnosis not present

## 2021-05-08 DIAGNOSIS — I5020 Unspecified systolic (congestive) heart failure: Secondary | ICD-10-CM | POA: Diagnosis not present

## 2021-05-08 DIAGNOSIS — I10 Essential (primary) hypertension: Secondary | ICD-10-CM

## 2021-05-10 ENCOUNTER — Other Ambulatory Visit: Payer: Self-pay | Admitting: Nurse Practitioner

## 2021-05-10 NOTE — Telephone Encounter (Signed)
Requested Prescriptions  Pending Prescriptions Disp Refills  . metoprolol succinate (TOPROL-XL) 25 MG 24 hr tablet [Pharmacy Med Name: METOPROLOL SUCC ER 25 MG TAB] 90 tablet 1    Sig: TAKE 1 TABLET BY MOUTH EVERY DAY     Cardiovascular:  Beta Blockers Passed - 05/10/2021  1:22 AM      Passed - Last BP in normal range    BP Readings from Last 1 Encounters:  04/21/21 126/76         Passed - Last Heart Rate in normal range    Pulse Readings from Last 1 Encounters:  04/21/21 (!) 51         Passed - Valid encounter within last 6 months    Recent Outpatient Visits          2 weeks ago Hematuria, unspecified type   [facility], Clydie Braun, NP   1 month ago Acute cystitis with hematuria   [facility] Larae Grooms, NP   2 months ago Primary hypertension   [facility] Larae Grooms, NP   5 months ago HFrEF (heart failure with reduced ejection fraction) [facility])   [facility] Larae Grooms, NP   7 months ago Primary hypertension   [facility] Larae Grooms, NP      Future Appointments            In 1 week Richardo Hanks Laurette Schimke, MD [facility]   In 3 weeks Larae Grooms, NP [facility]   In 11 months  [facility], [facility]

## 2021-05-17 ENCOUNTER — Ambulatory Visit: Payer: Medicare Other | Admitting: Urology

## 2021-05-17 ENCOUNTER — Other Ambulatory Visit: Payer: Self-pay

## 2021-05-17 ENCOUNTER — Encounter: Payer: Self-pay | Admitting: Urology

## 2021-05-17 VITALS — BP 138/74 | HR 50 | Ht 67.0 in | Wt 150.0 lb

## 2021-05-17 DIAGNOSIS — R310 Gross hematuria: Secondary | ICD-10-CM | POA: Diagnosis not present

## 2021-05-17 LAB — MICROSCOPIC EXAMINATION: Bacteria, UA: NONE SEEN

## 2021-05-17 NOTE — Patient Instructions (Signed)

## 2021-05-17 NOTE — Progress Notes (Signed)
   05/17/21 10:49 AM   Caleb Fleming. [birth year] [id number]  CC: Caleb Fleming hematuria  HPI: I saw Caleb Fleming and his daughter-in-law today for evaluation of gross hematuria.  He is a frail-appearing 87 year old male who previously was on Eliquis and developed intermittent painless gross hematuria about a month ago.  Urinalysis at that time was suspicious for infection and he was started on nitrofurantoin, but urine culture was ultimately negative.  He had recurrent hematuria and was then changed to Cipro.  The blood is cleared up over the last week or so, and he stopped his Eliquis when he noticed the bleeding initially.  He has a distant smoking history and quit over 60 years ago.  He previously worked multiple jobs including in Designer, fashion/clothing.  Urinalysis today is completely benign  PMH: Past Medical History:  Diagnosis Date   Anxiety    BPH (benign prostatic hypertrophy)    Depression    ED (erectile dysfunction)    Gout    Hyperlipidemia    Hypertension    Insomnia     Surgical History: No past surgical history on file.   Family History: Family History  Problem Relation Age of Onset   Cancer Mother    Cancer Father     Social History:  reports that he has quit smoking. His smokeless tobacco use includes chew. He reports current alcohol use. He reports that he does not use drugs.  Physical Exam: BP 138/74   Pulse (!) 50   Ht 5\' 7"  (1.702 m)   Wt 150 lb (68 kg)   BMI 23.49 kg/m    Constitutional:  Alert and oriented, No acute distress.  Elderly reviewed, see HPI Cardiovascular: No clubbing, cyanosis, or edema. Respiratory: Normal respiratory effort, no increased work of breathing. GI: Abdomen is soft, nontender, nondistended, no abdominal masses  Laboratory Data: Reviewed  Pertinent Imaging: No prior abdominal imaging to review   Assessment & Plan:   87 year old male with painless gross hematuria that has improved after antibiotics, but urine culture was  negative.  We discussed common possible etiologies of hematuria including UTI, BPH, malignancy, urolithiasis, medical renal disease, and idiopathic. Standard workup recommended by the AUA includes imaging with CT urogram to assess the upper tracts, and cystoscopy. Cytology is performed on patient's with gross hematuria to look for malignant cells in the urine.  CT urogram and cystoscopy for further evaluation of gross hematuria  with the patient, and post visit ordering of labs/imaging/tests.  97, MD 05/17/2021  [facility] [address] [place], [place] [phone]

## 2021-05-19 LAB — URINALYSIS, COMPLETE
Bilirubin, UA: NEGATIVE
Glucose, UA: NEGATIVE
Ketones, UA: NEGATIVE
Leukocytes,UA: NEGATIVE
Nitrite, UA: NEGATIVE
Protein,UA: NEGATIVE
RBC, UA: NEGATIVE
Specific Gravity, UA: 1.02 (ref 1.005–1.030)
Urobilinogen, Ur: 0.2 mg/dL (ref 0.2–1.0)
pH, UA: 5.5 (ref 5.0–7.5)

## 2021-05-22 ENCOUNTER — Other Ambulatory Visit: Payer: Self-pay | Admitting: Nurse Practitioner

## 2021-05-22 NOTE — Telephone Encounter (Signed)
Requested medication (s) are due for refill today - yes  Requested medication (s) are on the active medication list -yes  Future visit scheduled -yes  Last refill: 05/18/20 #90 4 RF  Notes to clinic: Request RF: expired Rx  Requested Prescriptions  Pending Prescriptions Disp Refills   simvastatin (ZOCOR) 20 MG tablet [Pharmacy Med Name: SIMVASTATIN 20 MG TABLET] 90 tablet 4    Sig: TAKE 1 TABLET BY MOUTH DAILY AT 6 PM.     Cardiovascular:  Antilipid - Statins Passed - 05/22/2021  1:32 AM      Passed - Total Cholesterol in normal range and within 360 days    Cholesterol, Total  Date Value Ref Range Status  03/01/2021 140 100 - 199 mg/dL Final          Passed - LDL in normal range and within 360 days    LDL Chol Calc (NIH)  Date Value Ref Range Status  03/01/2021 71 0 - 99 mg/dL Final          Passed - HDL in normal range and within 360 days    HDL  Date Value Ref Range Status  03/01/2021 53 >39 mg/dL Final          Passed - Triglycerides in normal range and within 360 days    Triglycerides  Date Value Ref Range Status  03/01/2021 82 0 - 149 mg/dL Final          Passed - Patient is not pregnant      Passed - Valid encounter within last 12 months    Recent Outpatient Visits           1 month ago Hematuria, unspecified type   [facility] Larae Grooms, NP   1 month ago Acute cystitis with hematuria   [facility] Larae Grooms, NP   2 months ago Primary hypertension   [facility] Larae Grooms, NP   5 months ago HFrEF (heart failure with reduced ejection fraction) (HCC)   [facility] Larae Grooms, NP   7 months ago Primary hypertension   [facility] Larae Grooms, NP       Future Appointments             In 2 weeks Larae Grooms, NP [facility], [facility]   In 11 months  [facility], [facility]               Requested Prescriptions  Pending  Prescriptions Disp Refills   simvastatin (ZOCOR) 20 MG tablet [Pharmacy Med Name: SIMVASTATIN 20 MG TABLET] 90 tablet 4    Sig: TAKE 1 TABLET BY MOUTH DAILY AT 6 PM.     Cardiovascular:  Antilipid - Statins Passed - 05/22/2021  1:32 AM      Passed - Total Cholesterol in normal range and within 360 days    Cholesterol, Total  Date Value Ref Range Status  03/01/2021 140 100 - 199 mg/dL Final          Passed - LDL in normal range and within 360 days    LDL Chol Calc (NIH)  Date Value Ref Range Status  03/01/2021 71 0 - 99 mg/dL Final          Passed - HDL in normal range and within 360 days    HDL  Date Value Ref Range Status  03/01/2021 53 >39 mg/dL Final          Passed - Triglycerides in normal range and  within 360 days    Triglycerides  Date Value Ref Range Status  03/01/2021 82 0 - 149 mg/dL Final          Passed - Patient is not pregnant      Passed - Valid encounter within last 12 months    Recent Outpatient Visits           1 month ago Hematuria, unspecified type   [facility] Jon Billings, NP   1 month ago Acute cystitis with hematuria   [place], NP   2 months ago Primary hypertension   [facility] Jon Billings, NP   5 months ago HFrEF (heart failure with reduced ejection fraction) [facility])   [facility] Jon Billings, NP   7 months ago Primary hypertension   [facility] Jon Billings, NP       Future Appointments             In 2 weeks Jon Billings, NP [facility], [place]   In 59 months  [employer], [place]
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
## 2021-06-05 ENCOUNTER — Other Ambulatory Visit: Payer: Self-pay

## 2021-06-05 ENCOUNTER — Ambulatory Visit (INDEPENDENT_AMBULATORY_CARE_PROVIDER_SITE_OTHER): Payer: Medicare Other | Admitting: Nurse Practitioner

## 2021-06-05 ENCOUNTER — Encounter: Payer: Self-pay | Admitting: Nurse Practitioner

## 2021-06-05 VITALS — BP 123/58 | HR 51 | Temp 98.1°F | Ht 66.1 in | Wt 161.4 lb

## 2021-06-05 DIAGNOSIS — R319 Hematuria, unspecified: Secondary | ICD-10-CM | POA: Diagnosis not present

## 2021-06-05 DIAGNOSIS — R051 Acute cough: Secondary | ICD-10-CM

## 2021-06-05 DIAGNOSIS — J069 Acute upper respiratory infection, unspecified: Secondary | ICD-10-CM

## 2021-06-05 DIAGNOSIS — I10 Essential (primary) hypertension: Secondary | ICD-10-CM

## 2021-06-05 DIAGNOSIS — I700 Atherosclerosis of aorta: Secondary | ICD-10-CM

## 2021-06-05 DIAGNOSIS — N1832 Chronic kidney disease, stage 3b: Secondary | ICD-10-CM

## 2021-06-05 DIAGNOSIS — E782 Mixed hyperlipidemia: Secondary | ICD-10-CM | POA: Diagnosis not present

## 2021-06-05 DIAGNOSIS — I5020 Unspecified systolic (congestive) heart failure: Secondary | ICD-10-CM

## 2021-06-05 DIAGNOSIS — I480 Paroxysmal atrial fibrillation: Secondary | ICD-10-CM

## 2021-06-05 DIAGNOSIS — F32A Depression, unspecified: Secondary | ICD-10-CM | POA: Diagnosis not present

## 2021-06-05 DIAGNOSIS — F419 Anxiety disorder, unspecified: Secondary | ICD-10-CM

## 2021-06-05 MED ORDER — AZITHROMYCIN 250 MG PO TABS: ORAL_TABLET | ORAL | 0 refills | Status: AC

## 2021-06-05 MED ORDER — METOPROLOL SUCCINATE ER 25 MG PO TB24: 25.0000 mg | ORAL_TABLET | Freq: Every day | ORAL | 1 refills | Status: DC

## 2021-06-05 MED ORDER — METHYLPREDNISOLONE 4 MG PO TBPK: ORAL_TABLET | ORAL | 0 refills | Status: DC

## 2021-06-05 NOTE — Assessment & Plan Note (Signed)
Chronic.  Controlled.  Continue with current medication regimen on Metoprolol 25mg  BID.  Labs ordered today.  Return to clinic in 3 months for reevaluation.  Call sooner if concerns arise.

## 2021-06-05 NOTE — Assessment & Plan Note (Signed)
Chronic, ongoing with rate-controlled at this time.  Continue current medication regimen and adjust as needed.  Patient declines going to Cardiology.  Patient has stopped Eliquis due to hematuria.  Will reevalate need for Eliquis after Cystoscopy.  Will reassess at future visits. Labs today.  Follow up in 3 months.  Call sooner if concerns arise.

## 2021-06-05 NOTE — Assessment & Plan Note (Signed)
Chronic. Controlled.  Continue with current medication regimen of Simvastatin 20mg daily. Labs ordered today.  Follow up in 3 months.  Call sooner if concerns arise.  

## 2021-06-05 NOTE — Assessment & Plan Note (Signed)
Has Cystoscopy scheduled with Urology on 12/5.  Will follow up with patient after procedure.

## 2021-06-05 NOTE — Assessment & Plan Note (Signed)
Chronic.  Controlled.  Continue with current medication regimen on Simvastatin 20mg.  Labs ordered today.  Return to clinic in 3 months for reevaluation.  Call sooner if concerns arise.   

## 2021-06-05 NOTE — Assessment & Plan Note (Signed)
Chronic, ongoing with rate-controlled at this time.  Continue current medication regimen and adjust as needed.  Patient declines going to Cardiology.  Metoprolol refilled at visit today.  Will reassess at future visits. Labs today.  Follow up in 3 months.  Call sooner if concerns arise.

## 2021-06-05 NOTE — Assessment & Plan Note (Signed)
Chronic. Labs ordered today.  Patient declines referral to Nephrology. Will reassess at future visits.  Continue current medication regimen.

## 2021-06-05 NOTE — Assessment & Plan Note (Signed)
Chronic.  Controlled without medication.  Labs ordered today.  Return to clinic in 3 months for reevaluation.  Call sooner if concerns arise.  ? ?

## 2021-06-06 LAB — CBC WITH DIFFERENTIAL/PLATELET
Basophils Absolute: 0 10*3/uL (ref 0.0–0.2)
Basos: 1 %
EOS (ABSOLUTE): 0.3 10*3/uL (ref 0.0–0.4)
Eos: 4 %
Hematocrit: 31.9 % — ABNORMAL LOW (ref 37.5–51.0)
Hemoglobin: 9.7 g/dL — ABNORMAL LOW (ref 13.0–17.7)
Immature Grans (Abs): 0 10*3/uL (ref 0.0–0.1)
Immature Granulocytes: 1 %
Lymphocytes Absolute: 1 10*3/uL (ref 0.7–3.1)
Lymphs: 14 %
MCH: 24 pg — ABNORMAL LOW (ref 26.6–33.0)
MCHC: 30.4 g/dL — ABNORMAL LOW (ref 31.5–35.7)
MCV: 79 fL (ref 79–97)
Monocytes Absolute: 1 10*3/uL — ABNORMAL HIGH (ref 0.1–0.9)
Monocytes: 14 %
Neutrophils Absolute: 4.6 10*3/uL (ref 1.4–7.0)
Neutrophils: 66 %
Platelets: 226 10*3/uL (ref 150–450)
RBC: 4.05 x10E6/uL — ABNORMAL LOW (ref 4.14–5.80)
RDW: 15 % (ref 11.6–15.4)
WBC: 6.9 10*3/uL (ref 3.4–10.8)

## 2021-06-06 LAB — LIPID PANEL
Chol/HDL Ratio: 2.1 ratio (ref 0.0–5.0)
Cholesterol, Total: 124 mg/dL (ref 100–199)
HDL: 60 mg/dL (ref >39)
LDL Chol Calc (NIH): 49 mg/dL (ref 0–99)
Triglycerides: 78 mg/dL (ref 0–149)
VLDL Cholesterol Cal: 15 mg/dL (ref 5–40)

## 2021-06-06 LAB — VERITOR FLU A/B WAIVED
Influenza A: NEGATIVE
Influenza B: NEGATIVE

## 2021-06-06 LAB — COMPREHENSIVE METABOLIC PANEL
ALT: 113 IU/L — ABNORMAL HIGH (ref 0–44)
AST: 116 IU/L — ABNORMAL HIGH (ref 0–40)
Albumin/Globulin Ratio: 2.2 (ref 1.2–2.2)
Albumin: 4 g/dL (ref 3.6–4.6)
Alkaline Phosphatase: 90 IU/L (ref 44–121)
BUN/Creatinine Ratio: 16 (ref 10–24)
BUN: 27 mg/dL (ref 8–27)
Bilirubin Total: <0.2 mg/dL (ref 0.0–1.2)
CO2: 18 mmol/L — ABNORMAL LOW (ref 20–29)
Calcium: 8.6 mg/dL (ref 8.6–10.2)
Chloride: 107 mmol/L — ABNORMAL HIGH (ref 96–106)
Creatinine, Ser: 1.64 mg/dL — ABNORMAL HIGH (ref 0.76–1.27)
Globulin, Total: 1.8 g/dL (ref 1.5–4.5)
Glucose: 84 mg/dL (ref 70–99)
Potassium: 4.8 mmol/L (ref 3.5–5.2)
Sodium: 141 mmol/L (ref 134–144)
Total Protein: 5.8 g/dL — ABNORMAL LOW (ref 6.0–8.5)
eGFR: 40 mL/min/{1.73_m2} — ABNORMAL LOW (ref >59)

## 2021-06-06 MED ORDER — IRON (FERROUS SULFATE) 325 (65 FE) MG PO TABS: 325.0000 mg | ORAL_TABLET | Freq: Every day | ORAL | 1 refills | Status: DC

## 2021-06-06 NOTE — Addendum Note (Signed)
Addended by: Larae Grooms on: 06/06/2021 10:47 AM   Modules accepted: Orders

## 2021-06-06 NOTE — Progress Notes (Signed)
Please call patient's daughter in law.  Please let patient know that his lab work shows that his kidney function has declined some but this is likely due to him not feeling well.  We will recheck this at his next visit.   His liver enzymes are elevated.  Recommend avoiding Tylenol and decreasing alcohol intake.   Please let patient know that his anemia has worsened.  This could be due to the blood in his urine recently.  I would like him to start Iron supplements.  I sent Ferrous sulfate to the pharmacy for him to start.  If the procedure with Urology does not reveal a cause for the decreased blood counts he may need to see gastroenterology.

## 2021-06-07 ENCOUNTER — Other Ambulatory Visit: Payer: Medicare Other | Admitting: Urology

## 2021-06-07 LAB — SARS-COV-2, NAA 2 DAY TAT

## 2021-06-07 LAB — NOVEL CORONAVIRUS, NAA: SARS-CoV-2, NAA: NOT DETECTED

## 2021-06-07 NOTE — Progress Notes (Signed)
Please let patient know that his COVID test was negative.

## 2021-06-09 ENCOUNTER — Encounter: Payer: Self-pay | Admitting: Urology

## 2021-06-12 ENCOUNTER — Ambulatory Visit: Admission: RE | Admit: 2021-06-12 | Payer: Medicare Other | Source: Ambulatory Visit

## 2021-06-20 ENCOUNTER — Telehealth: Payer: Self-pay

## 2021-06-20 NOTE — Chronic Care Management (AMB) (Signed)
°  Care Management   Note  06/20/2021 Name: Caleb Fleming. MRN: [id number] DOB: 08/09/1931  Caleb Fleming. is a 87 y.o. year old male who is a primary care patient of Larae Grooms, NP and is actively engaged with the care management team. I reached out to [name]. by phone today to assist with re-scheduling a follow up visit with the RN Case Manager  Follow up plan: Unsuccessful telephone outreach attempt made. A HIPAA compliant phone message was left for the patient providing contact information and requesting a return call.  The care management team will reach out to the patient again over the next 7 days.  If patient returns call to provider office, please advise to call Embedded Care Management Care Guide Penne Lash  at [phone]  Penne Lash, RMA Care Guide, Embedded Care Coordination [facility]  [place], [place] [address] Direct Dial: [phone] [email or web address] Website: [email or web address]

## 2021-06-21 ENCOUNTER — Telehealth: Payer: Medicare Other

## 2021-06-21 ENCOUNTER — Telehealth: Payer: Self-pay

## 2021-06-21 NOTE — Chronic Care Management (AMB) (Signed)
° ° °  Chronic Care Management Pharmacy Assistant   Name: Caleb Fleming.  MRN: [id number] DOB: 29-Mar-1932  Reason for Encounter: Disease State General  Recent office visits:  06/05/21-Karen Caren Griffins, NP (PCP) General follow up visit. Labs ordered. Patient has stopped Eliquis due to hematuria. Patient declines referral to Nephrology. Flu and COIVD testing completed. Follow up in 3 months. 04/21/21-Karen Caren Griffins, NP (PCP) Seen for Urinary tract infection. Will treat patient with Ciprofloxacin since symptoms improved after last visit with Macrobid. Stop taking Eliquis. Ambulatory referral to Urology. 04/10/21-Karen Caren Griffins, NP (PCP) Seen for hematuria. Start on [employer]. Return if symptoms worsen or fail to improve. 03/01/21-Karen Caren Griffins, NP (PCP) General follow up visit. Labs ordered. .  Patient declines going to Cardiology and  Patient declines referral to Nephrology. Follow up in 3 months.  Recent consult visits:  05/17/21-Brian C. Richardo Hanks, MD (Urology) Initial visit. CT urogram and cystoscopy for further evaluation of gross hematuria. Follow up in 3 weeks.  Hospital visits:  None in previous 6 months  Medications: Outpatient Encounter Medications as of 06/21/2021  Medication Sig   acetaminophen (TYLENOL) 325 MG tablet Take 650 mg by mouth every 4 (four) hours as needed.   allopurinol (ZYLOPRIM) 100 MG tablet Take 1 tablet (100 mg total) by mouth daily.   amiodarone (PACERONE) 200 MG tablet Take 1 tablet (200 mg total) by mouth daily.   Iron, Ferrous Sulfate, 325 (65 Fe) MG TABS Take 325 mg by mouth daily.   methylPREDNISolone (MEDROL DOSEPAK) 4 MG TBPK tablet Take as directed   metoprolol succinate (TOPROL-XL) 25 MG 24 hr tablet Take 1 tablet (25 mg total) by mouth daily.   potassium chloride (KLOR-CON) 10 MEQ tablet TAKE 1 TABLET (10 MEQ TOTAL) BY MOUTH 2 (TWO) TIMES DAILY.   simvastatin (ZOCOR) 20 MG tablet TAKE 1 TABLET BY MOUTH DAILY AT 6 PM.   Zinc 220 (50 Zn) MG  CAPS Take 1 capsule by mouth daily.   No facility-administered encounter medications on file as of 06/21/2021.     Care Gaps: Zoster Vaccines- Shingrix:Never done COVID-19 Vaccine:Overdue since 02/19/2020   Star Rating Drugs: Simvastatin 20 mg Last filled:05/22/21 90 DS  Unsuccessful attempt to complete assessment call. I have left 3 voicemail's for the patient to return phone call.  Rance Muir, RMA Health Concierge

## 2021-06-29 NOTE — Chronic Care Management (AMB) (Signed)
°  Care Management   Note  06/29/2021 Name: Caleb Fleming. MRN: [id number] DOB: [birth year]  Caleb Fleming. is a 87 y.o. year old male who is a primary care patient of Larae Grooms, NP and is actively engaged with the care management team. I reached out to [name]. by phone today to assist with re-scheduling a follow up visit with the RN Case Manager  Follow up plan: Telephone appointment with care management team member scheduled for:08/15/2021  Penne Lash, RMA Care Guide, Embedded Care Coordination [facility]  [place], [place] [address] Direct Dial: [phone] [email or web address]: [email or web address]

## 2021-08-02 ENCOUNTER — Other Ambulatory Visit: Payer: Self-pay | Admitting: Nurse Practitioner

## 2021-08-02 NOTE — Telephone Encounter (Signed)
Requested Prescriptions  Pending Prescriptions Disp Refills   amiodarone (PACERONE) 200 MG tablet [Pharmacy Med Name: AMIODARONE HCL 200 MG TABLET] 90 tablet 4    Sig: TAKE 1 TABLET BY MOUTH EVERY DAY     Not Delegated - Cardiovascular: Antiarrhythmic Agents - amiodarone Failed - 08/02/2021  1:35 AM      Failed - This refill cannot be delegated      Failed - TSH in normal range and within 180 days    TSH  Date Value Ref Range Status  05/18/2020 0.781 0.450 - 4.500 uIU/mL Final         Failed - Mg Level in normal range and within 180 days    Magnesium  Date Value Ref Range Status  02/15/2020 2.2 1.7 - 2.4 mg/dL Final    Comment:    Performed at [facility], [address] [place], [place] [address]         Failed - AST in normal range and within 180 days    AST  Date Value Ref Range Status  06/05/2021 116 (H) 0 - 40 IU/L Final         Failed - ALT in normal range and within 180 days    ALT  Date Value Ref Range Status  06/05/2021 113 (H) 0 - 44 IU/L Final         Failed - Patient had ECG in the last 180 days      Passed - K in normal range and within 180 days    Potassium  Date Value Ref Range Status  06/05/2021 4.8 3.5 - 5.2 mmol/L Final         Passed - Ca in normal range and within 180 days    Calcium  Date Value Ref Range Status  06/05/2021 8.6 8.6 - 10.2 mg/dL Final         Passed - Last BP in normal range    BP Readings from Last 1 Encounters:  06/05/21 (!) 123/58         Passed - Last Heart Rate in normal range    Pulse Readings from Last 1 Encounters:  06/05/21 (!) 51         Passed - Valid encounter within last 6 months    Recent Outpatient Visits          1 month ago Hematuria, unspecified type   [facility] Jon Billings, NP   3 months ago Hematuria, unspecified type   [facility] Jon Billings, NP   3 months ago Acute cystitis with hematuria   [facility] Jon Billings, NP   5  months ago Primary hypertension   [place], Santiago [place], NP   8 months ago HFrEF (heart failure with reduced ejection fraction) ([place])   [place], NP      Future Appointments            In 1 month Jon Billings, NP [place], [facility]   In 8 months  [place], [facility]            allopurinol (ZYLOPRIM) 100 MG tablet [Pharmacy Med Name: ALLOPURINOL 100 MG TABLET] 90 tablet 2    Sig: TAKE 1 TABLET BY MOUTH EVERY DAY     Endocrinology:  Gout Agents Failed - 08/02/2021  1:35 AM      Failed - Uric Acid in normal range and within 360 days    Uric Acid  Date  Value Ref Range Status  11/20/2019 10.0 (H) 3.8 - 8.4 mg/dL Final    Comment:               Therapeutic target for gout patients: <6.0         Failed - Cr in normal range and within 360 days    Creatinine, Ser  Date Value Ref Range Status  06/05/2021 1.64 (H) 0.76 - 1.27 mg/dL Final         Passed - Valid encounter within last 12 months    Recent Outpatient Visits          1 month ago Hematuria, unspecified type   [place], NP   3 months ago Hematuria, unspecified type   [facility] Jon Billings, NP   3 months ago Acute cystitis with hematuria   [place], NP   5 months ago Primary hypertension   [facility] Jon Billings, NP   8 months ago HFrEF (heart failure with reduced ejection fraction) [facility])   [facility] Jon Billings, NP      Future Appointments            In 1 month Jon Billings, NP [facility], [place]   In 48 months  [employer], [place]

## 2021-08-02 NOTE — Telephone Encounter (Signed)
Requested medication (s) are due for refill today: yes  Requested medication (s) are on the active medication list: yes  Last refill:  05/10/21  Future visit scheduled: 09/05/21  Notes to clinic:  This medication can not be delegated, please assess.   Requested Prescriptions  Pending Prescriptions Disp Refills   amiodarone (PACERONE) 200 MG tablet [Pharmacy Med Name: AMIODARONE HCL 200 MG TABLET] 90 tablet 4    Sig: TAKE 1 TABLET BY MOUTH EVERY DAY     Not Delegated - Cardiovascular: Antiarrhythmic Agents - amiodarone Failed - 08/02/2021  1:35 AM      Failed - This refill cannot be delegated      Failed - TSH in normal range and within 180 days    TSH  Date Value Ref Range Status  05/18/2020 0.781 0.450 - 4.500 uIU/mL Final          Failed - Mg Level in normal range and within 180 days    Magnesium  Date Value Ref Range Status  02/15/2020 2.2 1.7 - 2.4 mg/dL Final    Comment:    Performed at [facility], [address] [place], [place] [address]          Failed - AST in normal range and within 180 days    AST  Date Value Ref Range Status  06/05/2021 116 (H) 0 - 40 IU/L Final          Failed - ALT in normal range and within 180 days    ALT  Date Value Ref Range Status  06/05/2021 113 (H) 0 - 44 IU/L Final          Failed - Patient had ECG in the last 180 days      Passed - K in normal range and within 180 days    Potassium  Date Value Ref Range Status  06/05/2021 4.8 3.5 - 5.2 mmol/L Final          Passed - Ca in normal range and within 180 days    Calcium  Date Value Ref Range Status  06/05/2021 8.6 8.6 - 10.2 mg/dL Final          Passed - Last BP in normal range    BP Readings from Last 1 Encounters:  06/05/21 (!) 123/58          Passed - Last Heart Rate in normal range    Pulse Readings from Last 1 Encounters:  06/05/21 (!) 51          Passed - Valid encounter within last 6 months    Recent Outpatient Visits           1 month  ago Hematuria, unspecified type   [facility] Jon Billings, NP   3 months ago Hematuria, unspecified type   [facility] Jon Billings, NP   3 months ago Acute cystitis with hematuria   [place], NP   5 months ago Primary hypertension   [facility] Jon Billings, NP   8 months ago HFrEF (heart failure with reduced ejection fraction) [facility])   [place], NP       Future Appointments             In 1 month Jon Billings, NP [facility], [facility]   In 8 months  [facility] [employer], [facility]            Signed Prescriptions Disp Refills   allopurinol (  ZYLOPRIM) 100 MG tablet 90 tablet 2    Sig: TAKE 1 TABLET BY MOUTH EVERY DAY     Endocrinology:  Gout Agents Failed - 08/02/2021  1:35 AM      Failed - Uric Acid in normal range and within 360 days    Uric Acid  Date Value Ref Range Status  11/20/2019 10.0 (H) 3.8 - 8.4 mg/dL Final    Comment:               Therapeutic target for gout patients: <6.0          Failed - Cr in normal range and within 360 days    Creatinine, Ser  Date Value Ref Range Status  06/05/2021 1.64 (H) 0.76 - 1.27 mg/dL Final          Passed - Valid encounter within last 12 months    Recent Outpatient Visits           1 month ago Hematuria, unspecified type   [facility] Jon Billings, NP   3 months ago Hematuria, unspecified type   [facility] Jon Billings, NP   3 months ago Acute cystitis with hematuria   [place], NP   5 months ago Primary hypertension   [facility] Jon Billings, NP   8 months ago HFrEF (heart failure with reduced ejection fraction) [facility])   [facility] Jon Billings, NP       Future Appointments             In 1 month Jon Billings, NP [facility], [place]   In 8 months   [employer], [place]

## 2021-08-15 ENCOUNTER — Telehealth: Payer: Self-pay

## 2021-08-15 ENCOUNTER — Telehealth: Payer: Medicare Other

## 2021-08-15 NOTE — Telephone Encounter (Signed)
°  Care Management   Follow Up Note   08/15/2021 Name: Tashan Kreitzer. MRN: [id number] DOB: 1931-12-29   Referred by: Larae Grooms, NP Reason for referral : Chronic Care Management (RNCM: Follow up for Chronic Disease Management and Care Coordination Needs )   An unsuccessful telephone outreach was attempted today. The patient was referred to the case management team for assistance with care management and care coordination.   Follow Up Plan: A HIPPA compliant phone message was left for the patient providing contact information and requesting a return call.   [name] RN, MSN, CCM Community Care Coordinator [place]   [facility] Mobile: [phone]

## 2021-08-21 ENCOUNTER — Other Ambulatory Visit: Payer: Self-pay | Admitting: Nurse Practitioner

## 2021-08-21 NOTE — Telephone Encounter (Signed)
Requested medication (s) are due for refill today: yes  Requested medication (s) are on the active medication list: yes  Last refill:  11/29/20 #180 with 1 RF  Future visit scheduled: 09/05/21  Notes to clinic:  failed protocol of creatinine within normal limits within 360 days, please assess.   Requested Prescriptions  Pending Prescriptions Disp Refills   potassium chloride (KLOR-CON) 10 MEQ tablet [Pharmacy Med Name: POTASSIUM CL ER 10 MEQ TABLET] 180 tablet 1    Sig: TAKE 1 TABLET BY MOUTH 2 TIMES DAILY.     Endocrinology:  Minerals - Potassium Supplementation Failed - 08/21/2021  1:39 AM      Failed - Cr in normal range and within 360 days    Creatinine, Ser  Date Value Ref Range Status  06/05/2021 1.64 (H) 0.76 - 1.27 mg/dL Final          Passed - K in normal range and within 360 days    Potassium  Date Value Ref Range Status  06/05/2021 4.8 3.5 - 5.2 mmol/L Final          Passed - Valid encounter within last 12 months    Recent Outpatient Visits           2 months ago Hematuria, unspecified type   [facility] Jon Billings, NP   4 months ago Hematuria, unspecified type   [facility] Jon Billings, NP   4 months ago Acute cystitis with hematuria   [place], NP   5 months ago Primary hypertension   [facility] Jon Billings, NP   8 months ago HFrEF (heart failure with reduced ejection fraction) [facility])   [place], NP       Future Appointments             In 2 weeks Jon Billings, NP [facility], [place]   In 8 months  [employer], [place]

## 2021-08-22 ENCOUNTER — Telehealth: Payer: Self-pay

## 2021-08-22 NOTE — Chronic Care Management (AMB) (Signed)
°  Care Management   Note  08/22/2021 Name: Connar Keating. MRN: [id number] DOB: Sep 12, 1931  Mohab Ashby. is a 88 y.o. year old male who is a primary care patient of Larae Grooms, NP and is actively engaged with the care management team. I reached out to [name]. by phone today to assist with re-scheduling a follow up visit with the RN Case Manager  Follow up plan: Unsuccessful telephone outreach attempt made. A HIPAA compliant phone message was left for the patient providing contact information and requesting a return call.  The care management team will reach out to the patient again over the next 7 days.  If patient returns call to provider office, please advise to call Embedded Care Management Care Guide Penne Lash  at [phone]  Penne Lash, RMA Care Guide, Embedded Care Coordination [facility]  [place], [place] [address] Direct Dial: [phone] [email or web address]: [email or web address]

## 2021-08-24 ENCOUNTER — Other Ambulatory Visit: Payer: Self-pay | Admitting: Nurse Practitioner

## 2021-08-25 NOTE — Telephone Encounter (Signed)
Requested medication (s) are due for refill today: unknown  Requested medication (s) are on the active medication list: yes  Last refill:  unknown  Future visit scheduled: 09/05/21  Notes to clinic:  Historical Provider, please assess.       Requested Prescriptions  Pending Prescriptions Disp Refills   zinc sulfate 220 (50 Zn) MG capsule [Pharmacy Med Name: ZINC SULFATE 220 MG CAPSULE] 90 capsule 4    Sig: TAKE 1 CAPSULE BY MOUTH DAILY.     Endocrinology:  Minerals Passed - 08/24/2021  2:14 PM      Passed - Valid encounter within last 12 months    Recent Outpatient Visits           2 months ago Hematuria, unspecified type   [facility] Jon Billings, NP   4 months ago Hematuria, unspecified type   [facility] Jon Billings, NP   4 months ago Acute cystitis with hematuria   [place], NP   5 months ago Primary hypertension   [facility] Jon Billings, NP   8 months ago HFrEF (heart failure with reduced ejection fraction) [facility])   [place], NP       Future Appointments             In 1 week Jon Billings, NP [place], [place]   In 8 months  [employer], [place]

## 2021-09-04 NOTE — Chronic Care Management (AMB) (Signed)
°  Care Management   Note  09/04/2021 Name: Caleb Fleming. MRN: [id number] DOB: [birth year]  Caleb Factor. is a 88 y.o. year old male who is a primary care patient of Jon Billings, NP and is actively engaged with the care management team. I reached out to [name] by phone today to assist with re-scheduling a follow up visit with the RN Case Manager  Follow up plan: Telephone appointment with care management team member scheduled for:10/18/2021  Noreene Larsson, [address], [place], [place] [address] Direct Dial: [phone] [email or web address]

## 2021-09-04 NOTE — Progress Notes (Signed)
BP 132/64    Pulse (!) 45    Temp 97.8 F (36.6 C) (Oral)    Wt 152 lb 3.2 oz (69 kg)    SpO2 97%    BMI 24.49 kg/m    Subjective:    Patient ID: Caleb Platt., male    DOB: [birth year], 88 y.o.   MRN: [id number]  HPI: Caleb Fleming is a 88 y.o. male  Chief Complaint  Patient presents with   Hyperlipidemia   Hypertension   HYPERTENSION / [place] Satisfied with current treatment? yes Duration of hypertension: years BP monitoring frequency: not checking BP range:  BP medication side effects: no Past BP meds:  Metoprolol Duration of hyperlipidemia: years Cholesterol medication side effects: no Cholesterol supplements: none Past cholesterol medications: simvastatin (zocor) Medication compliance: excellent compliance Aspirin: no Recent stressors: no Recurrent headaches: no Visual changes: no Palpitations: no Dyspnea: no Chest pain: no Lower extremity edema: no Dizzy/lightheaded: no   AFIB Rate Stable.  Denies concerns.  Does not see Cardiology. Not currently on Eliquis due to hematuria.  ANXIETY AND DEPRESSION Patient denies SI/HI in office today. Denies feeling down or anxious.  Feels like everyday is a blessing.  Patient states he has had a cough for about 2 weeks.  He was sick for about 2 weeks but now the cough is lingering and he can't get rid of it.  He is having SOB and runny nose.    Relevant past medical, surgical, family and social history reviewed and updated as indicated. Interim medical history since our last visit reviewed. Allergies and medications reviewed and updated.  Review of Systems  HENT:  Positive for congestion and rhinorrhea.   Eyes:  Negative for visual disturbance.  Respiratory:  Positive for cough and shortness of breath.   Cardiovascular:  Negative for chest pain and leg swelling.  Neurological:  Negative for light-headedness and headaches.  Psychiatric/Behavioral:  Negative for dysphoric mood and suicidal ideas.  The patient is not nervous/anxious.    Per HPI unless specifically indicated above     Objective:    BP 132/64    Pulse (!) 45    Temp 97.8 F (36.6 C) (Oral)    Wt 152 lb 3.2 oz (69 kg)    SpO2 97%    BMI 24.49 kg/m   Wt Readings from Last 3 Encounters:  09/05/21 152 lb 3.2 oz (69 kg)  06/05/21 161 lb 6.4 oz (73.2 kg)  05/17/21 150 lb (68 kg)    Physical Exam Vitals and nursing note reviewed.  Constitutional:      General: He is not in acute distress.    Appearance: Normal appearance. He is not ill-appearing, toxic-appearing or diaphoretic.  HENT:     Head: Normocephalic.     Right Ear: External ear normal.     Left Ear: External ear normal.     Nose: Nose normal. No congestion or rhinorrhea.     Mouth/Throat:     Mouth: Mucous membranes are moist.  Eyes:     General:        Right eye: No discharge.        Left eye: No discharge.     Extraocular Movements: Extraocular movements intact.     Conjunctiva/sclera: Conjunctivae normal.     Pupils: Pupils are equal, round, and reactive to light.  Cardiovascular:     Rate and Rhythm: Normal rate and regular rhythm.     Heart sounds: No murmur heard. Pulmonary:  Effort: Pulmonary effort is normal. No respiratory distress.     Breath sounds: Rhonchi present. No wheezing or rales.  Abdominal:     General: Abdomen is flat. Bowel sounds are normal.  Musculoskeletal:     Cervical back: Normal range of motion and neck supple.  Skin:    General: Skin is warm and dry.     Capillary Refill: Capillary refill takes less than 2 seconds.  Neurological:     General: No focal deficit present.     Mental Status: He is alert and oriented to person, place, and time.  Psychiatric:        Mood and Affect: Mood normal.        Behavior: Behavior normal.        Thought Content: Thought content normal.        Judgment: Judgment normal.    Results for orders placed or performed in visit on 06/05/21  Novel Coronavirus, NAA ([facility])    Specimen: Nasopharyngeal(NP) swabs in vial transport medium  Result Value Ref Range   SARS-CoV-2, NAA Not Detected Not Detected  SARS-COV-2, NAA 2 DAY TAT  Result Value Ref Range   SARS-CoV-2, NAA 2 DAY TAT Performed   Comp Met (CMET)  Result Value Ref Range   Glucose 84 70 - 99 mg/dL   BUN 27 8 - 27 mg/dL   Creatinine, Ser 1.64 (H) 0.76 - 1.27 mg/dL   eGFR 40 (L) >59 mL/min/1.73   BUN/Creatinine Ratio 16 10 - 24   Sodium 141 134 - 144 mmol/L   Potassium 4.8 3.5 - 5.2 mmol/L   Chloride 107 (H) 96 - 106 mmol/L   CO2 18 (L) 20 - 29 mmol/L   Calcium 8.6 8.6 - 10.2 mg/dL   Total Protein 5.8 (L) 6.0 - 8.5 g/dL   Albumin 4.0 3.6 - 4.6 g/dL   Globulin, Total 1.8 1.5 - 4.5 g/dL   Albumin/Globulin Ratio 2.2 1.2 - 2.2   Bilirubin Total <0.2 0.0 - 1.2 mg/dL   Alkaline Phosphatase 90 44 - 121 IU/L   AST 116 (H) 0 - 40 IU/L   ALT 113 (H) 0 - 44 IU/L  Lipid Profile  Result Value Ref Range   Cholesterol, Total 124 100 - 199 mg/dL   Triglycerides 78 0 - 149 mg/dL   HDL 60 >39 mg/dL   VLDL Cholesterol Cal 15 5 - 40 mg/dL   LDL Chol Calc (NIH) 49 0 - 99 mg/dL   Chol/HDL Ratio 2.1 0.0 - 5.0 ratio  CBC w/Diff  Result Value Ref Range   WBC 6.9 3.4 - 10.8 x10E3/uL   RBC 4.05 (L) 4.14 - 5.80 x10E6/uL   Hemoglobin 9.7 (L) 13.0 - 17.7 g/dL   Hematocrit 31.9 (L) 37.5 - 51.0 %   MCV 79 79 - 97 fL   MCH 24.0 (L) 26.6 - 33.0 pg   MCHC 30.4 (L) 31.5 - 35.7 g/dL   RDW 15.0 11.6 - 15.4 %   Platelets 226 150 - 450 x10E3/uL   Neutrophils 66 Not Estab. %   Lymphs 14 Not Estab. %   Monocytes 14 Not Estab. %   Eos 4 Not Estab. %   Basos 1 Not Estab. %   Neutrophils Absolute 4.6 1.4 - 7.0 x10E3/uL   Lymphocytes Absolute 1.0 0.7 - 3.1 x10E3/uL   Monocytes Absolute 1.0 (H) 0.1 - 0.9 x10E3/uL   EOS (ABSOLUTE) 0.3 0.0 - 0.4 x10E3/uL   Basophils Absolute 0.0 0.0 - 0.2 x10E3/uL   Immature  Granulocytes 1 Not Estab. %   Immature Grans (Abs) 0.0 0.0 - 0.1 x10E3/uL  Veritor Flu A/B Waived  Result Value Ref  Range   Influenza A Negative Negative   Influenza B Negative Negative      Assessment & Plan:   Problem List Items Addressed This Visit       Cardiovascular and Mediastinum   Hypertension - Primary    Chronic.  Controlled.  Continue with current medication regimen on Metoprolol 70m BID.  Does not check blood pressures at home.  Labs ordered today.  Return to clinic in 3 months for reevaluation.  Call sooner if concerns arise.        Relevant Orders   Comp Met (CMET)   AF (paroxysmal atrial fibrillation) (HCC)    Chronic, ongoing with rate-controlled at this time.  Continue current medication regimen and adjust as needed.  Patient declines going to Cardiology.  Patient has stopped Eliquis due to hematuria.  Will need to restart Eliquis at next visit.  Labs today.  Follow up in 3 months.  Call sooner if concerns arise.       Relevant Orders   CBC w/Diff   HFrEF (heart failure with reduced ejection fraction) (HCC)    Chronic, ongoing with rate-controlled at this time.  Continue current medication regimen and adjust as needed.  Patient declines going to Cardiology.  Will reassess at future visits. Labs today.  Follow up in 3 months.  Call sooner if concerns arise.       Relevant Orders   CBC w/Diff   Aortic atherosclerosis (HCC)    Chronic. Controlled.  Continue with current medication regimen of Simvastatin 270mdaily. Labs ordered today.  Follow up in 3 months.  Call sooner if concerns arise.       Relevant Orders   CBC w/Diff     Genitourinary   CKD (chronic kidney disease), stage III (HCC)    Chronic. Labs ordered today.  Will make recommendations based on lab results.  Patient declines referral to Nephrology. Will reassess at future visits.  Continue current medication regimen.       Relevant Orders   CBC w/Diff     Other   Hyperlipidemia    Chronic.  Controlled.  Continue with current medication regimen on Simvastatin 2089m Labs ordered today.  Return to clinic in 3  months for reevaluation.  Call sooner if concerns arise.        Relevant Orders   Lipid Profile   Anxiety and depression    Chronic.  Controlled without medication.  Does not feel like he needs medication at this time.  Labs ordered today.  Return to clinic in 3 months for reevaluation.  Call sooner if concerns arise.        Other Visit Diagnoses     Acute cough       Chest xray ordered. Treated with amoxicillin and prendisone. FU if symptoms worsen or fail to improve.    Relevant Orders   DG Chest 2 View        Follow up plan: Return in about 3 months (around 12/03/2021) for HTN, HLD, DM2 FU.

## 2021-09-05 ENCOUNTER — Ambulatory Visit (INDEPENDENT_AMBULATORY_CARE_PROVIDER_SITE_OTHER): Payer: Medicare Other | Admitting: Nurse Practitioner

## 2021-09-05 ENCOUNTER — Inpatient Hospital Stay: Admit: 2021-09-05 | Payer: Medicare Other

## 2021-09-05 ENCOUNTER — Encounter: Payer: Self-pay | Admitting: Nurse Practitioner

## 2021-09-05 ENCOUNTER — Other Ambulatory Visit: Payer: Self-pay

## 2021-09-05 ENCOUNTER — Ambulatory Visit
Admission: RE | Admit: 2021-09-05 | Discharge: 2021-09-05 | Disposition: A | Discharge status: Home / Self Care | Payer: Medicare Other | Source: Ambulatory Visit | Attending: Nurse Practitioner | Admitting: Nurse Practitioner

## 2021-09-05 ENCOUNTER — Ambulatory Visit
Admission: RE | Admit: 2021-09-05 | Discharge: 2021-09-05 | Disposition: A | Discharge status: Home / Self Care | Payer: Medicare Other | Source: Home / Self Care | Attending: Nurse Practitioner | Admitting: Nurse Practitioner

## 2021-09-05 VITALS — BP 132/64 | HR 45 | Temp 97.8°F | Wt 152.2 lb

## 2021-09-05 DIAGNOSIS — N1832 Chronic kidney disease, stage 3b: Secondary | ICD-10-CM | POA: Diagnosis not present

## 2021-09-05 DIAGNOSIS — E782 Mixed hyperlipidemia: Secondary | ICD-10-CM

## 2021-09-05 DIAGNOSIS — F419 Anxiety disorder, unspecified: Secondary | ICD-10-CM

## 2021-09-05 DIAGNOSIS — I700 Atherosclerosis of aorta: Secondary | ICD-10-CM

## 2021-09-05 DIAGNOSIS — R051 Acute cough: Secondary | ICD-10-CM

## 2021-09-05 DIAGNOSIS — F32A Depression, unspecified: Secondary | ICD-10-CM

## 2021-09-05 DIAGNOSIS — I5020 Unspecified systolic (congestive) heart failure: Secondary | ICD-10-CM

## 2021-09-05 DIAGNOSIS — I10 Essential (primary) hypertension: Secondary | ICD-10-CM | POA: Diagnosis not present

## 2021-09-05 DIAGNOSIS — R059 Cough, unspecified: Secondary | ICD-10-CM | POA: Diagnosis not present

## 2021-09-05 DIAGNOSIS — I480 Paroxysmal atrial fibrillation: Secondary | ICD-10-CM | POA: Diagnosis not present

## 2021-09-05 MED ORDER — AMOXICILLIN 500 MG PO CAPS: 500.0000 mg | ORAL_CAPSULE | Freq: Two times a day (BID) | ORAL | 0 refills | Status: AC

## 2021-09-05 MED ORDER — PREDNISONE 10 MG PO TABS: 10.0000 mg | ORAL_TABLET | Freq: Every day | ORAL | 0 refills | Status: DC

## 2021-09-05 NOTE — Assessment & Plan Note (Signed)
Chronic.  Controlled.  Continue with current medication regimen on Metoprolol 25mg  BID.  Does not check blood pressures at home.  Labs ordered today.  Return to clinic in 3 months for reevaluation.  Call sooner if concerns arise.

## 2021-09-05 NOTE — Assessment & Plan Note (Signed)
Chronic, ongoing with rate-controlled at this time.  Continue current medication regimen and adjust as needed.  Patient declines going to Cardiology.  Will reassess at future visits. Labs today.  Follow up in 3 months.  Call sooner if concerns arise.  

## 2021-09-05 NOTE — Assessment & Plan Note (Signed)
Chronic.  Controlled without medication.  Does not feel like he needs medication at this time.  Labs ordered today.  Return to clinic in 3 months for reevaluation.  Call sooner if concerns arise.   

## 2021-09-05 NOTE — Assessment & Plan Note (Signed)
Chronic, ongoing with rate-controlled at this time.  Continue current medication regimen and adjust as needed.  Patient declines going to Cardiology.  Patient has stopped Eliquis due to hematuria.  Will need to restart Eliquis at next visit.  Labs today.  Follow up in 3 months.  Call sooner if concerns arise.  

## 2021-09-05 NOTE — Assessment & Plan Note (Signed)
Chronic. Controlled.  Continue with current medication regimen of Simvastatin 20mg daily. Labs ordered today.  Follow up in 3 months.  Call sooner if concerns arise.  

## 2021-09-05 NOTE — Assessment & Plan Note (Signed)
Chronic.  Controlled.  Continue with current medication regimen on Simvastatin 20mg.  Labs ordered today.  Return to clinic in 3 months for reevaluation.  Call sooner if concerns arise.   

## 2021-09-05 NOTE — Assessment & Plan Note (Signed)
Chronic. Labs ordered today.  Will make recommendations based on lab results.  Patient declines referral to Nephrology. Will reassess at future visits.  Continue current medication regimen.  

## 2021-09-06 LAB — CBC WITH DIFFERENTIAL/PLATELET
Basophils Absolute: 0.1 10*3/uL (ref 0.0–0.2)
Basos: 1 %
EOS (ABSOLUTE): 0.4 10*3/uL (ref 0.0–0.4)
Eos: 5 %
Hematocrit: 42.4 % (ref 37.5–51.0)
Hemoglobin: 14 g/dL (ref 13.0–17.7)
Immature Grans (Abs): 0.1 10*3/uL (ref 0.0–0.1)
Immature Granulocytes: 1 %
Lymphocytes Absolute: 1.5 10*3/uL (ref 0.7–3.1)
Lymphs: 19 %
MCH: 29.2 pg (ref 26.6–33.0)
MCHC: 33 g/dL (ref 31.5–35.7)
MCV: 89 fL (ref 79–97)
Monocytes Absolute: 0.9 10*3/uL (ref 0.1–0.9)
Monocytes: 12 %
Neutrophils Absolute: 4.7 10*3/uL (ref 1.4–7.0)
Neutrophils: 62 %
Platelets: 246 10*3/uL (ref 150–450)
RBC: 4.79 x10E6/uL (ref 4.14–5.80)
RDW: 19.3 % — ABNORMAL HIGH (ref 11.6–15.4)
WBC: 7.7 10*3/uL (ref 3.4–10.8)

## 2021-09-06 LAB — LIPID PANEL
Chol/HDL Ratio: 2.9 ratio (ref 0.0–5.0)
Cholesterol, Total: 129 mg/dL (ref 100–199)
HDL: 44 mg/dL (ref >39)
LDL Chol Calc (NIH): 66 mg/dL (ref 0–99)
Triglycerides: 99 mg/dL (ref 0–149)
VLDL Cholesterol Cal: 19 mg/dL (ref 5–40)

## 2021-09-06 LAB — COMPREHENSIVE METABOLIC PANEL
ALT: 75 IU/L — ABNORMAL HIGH (ref 0–44)
AST: 63 IU/L — ABNORMAL HIGH (ref 0–40)
Albumin/Globulin Ratio: 1.9 (ref 1.2–2.2)
Albumin: 3.8 g/dL (ref 3.6–4.6)
Alkaline Phosphatase: 88 IU/L (ref 44–121)
BUN/Creatinine Ratio: 12 (ref 10–24)
BUN: 15 mg/dL (ref 8–27)
Bilirubin Total: 0.3 mg/dL (ref 0.0–1.2)
CO2: 21 mmol/L (ref 20–29)
Calcium: 8.6 mg/dL (ref 8.6–10.2)
Chloride: 106 mmol/L (ref 96–106)
Creatinine, Ser: 1.25 mg/dL (ref 0.76–1.27)
Globulin, Total: 2 g/dL (ref 1.5–4.5)
Glucose: 84 mg/dL (ref 70–99)
Potassium: 3.9 mmol/L (ref 3.5–5.2)
Sodium: 140 mmol/L (ref 134–144)
Total Protein: 5.8 g/dL — ABNORMAL LOW (ref 6.0–8.5)
eGFR: 56 mL/min/{1.73_m2} — ABNORMAL LOW (ref >59)

## 2021-09-06 NOTE — Progress Notes (Signed)
Please let patient know that his work looks good.  Kidney function and anemia have improved from prior visit. Continue with your current medication regimen.  Follow up as discussed.  Please let me know if you have any questions.

## 2021-09-06 NOTE — Progress Notes (Signed)
Please let patient know his chest xray was normal. He should complete the course of treatment as discussed during our visit.  Please let me know if he has any questions.

## 2021-09-23 IMAGING — CR DG CHEST 2V
1 series · 2 of 2 positions shown · non-contrast
Comparison: Chest radiograph dated 12/11/2018

CLINICAL DATA: 88-year-old male with cough.

EXAM:
CHEST - 2 VIEW

[Series 1: dg chest 2 view · 0.14mm/px · 2 of 2 slices shown]
[im 1/2]
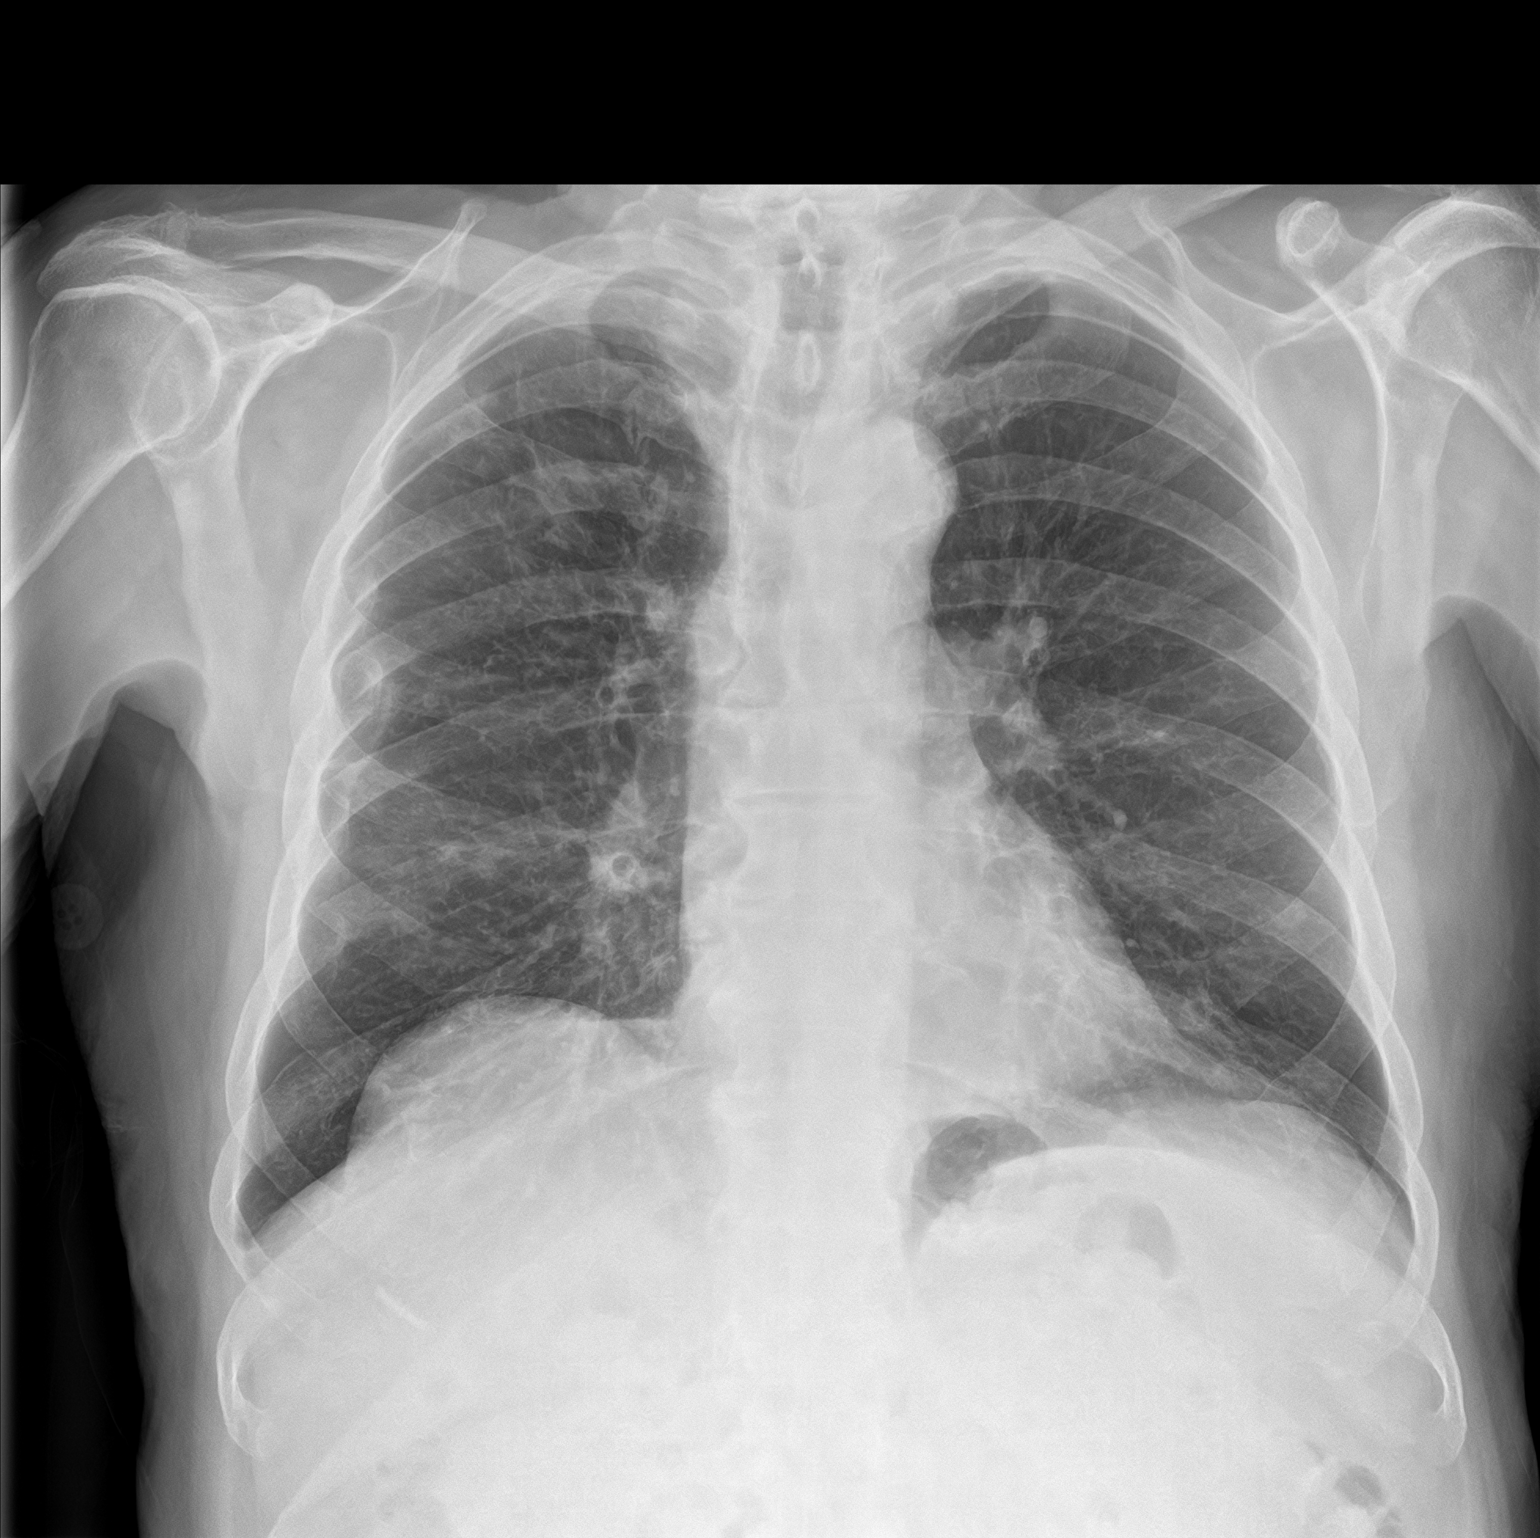
[im 2/2]
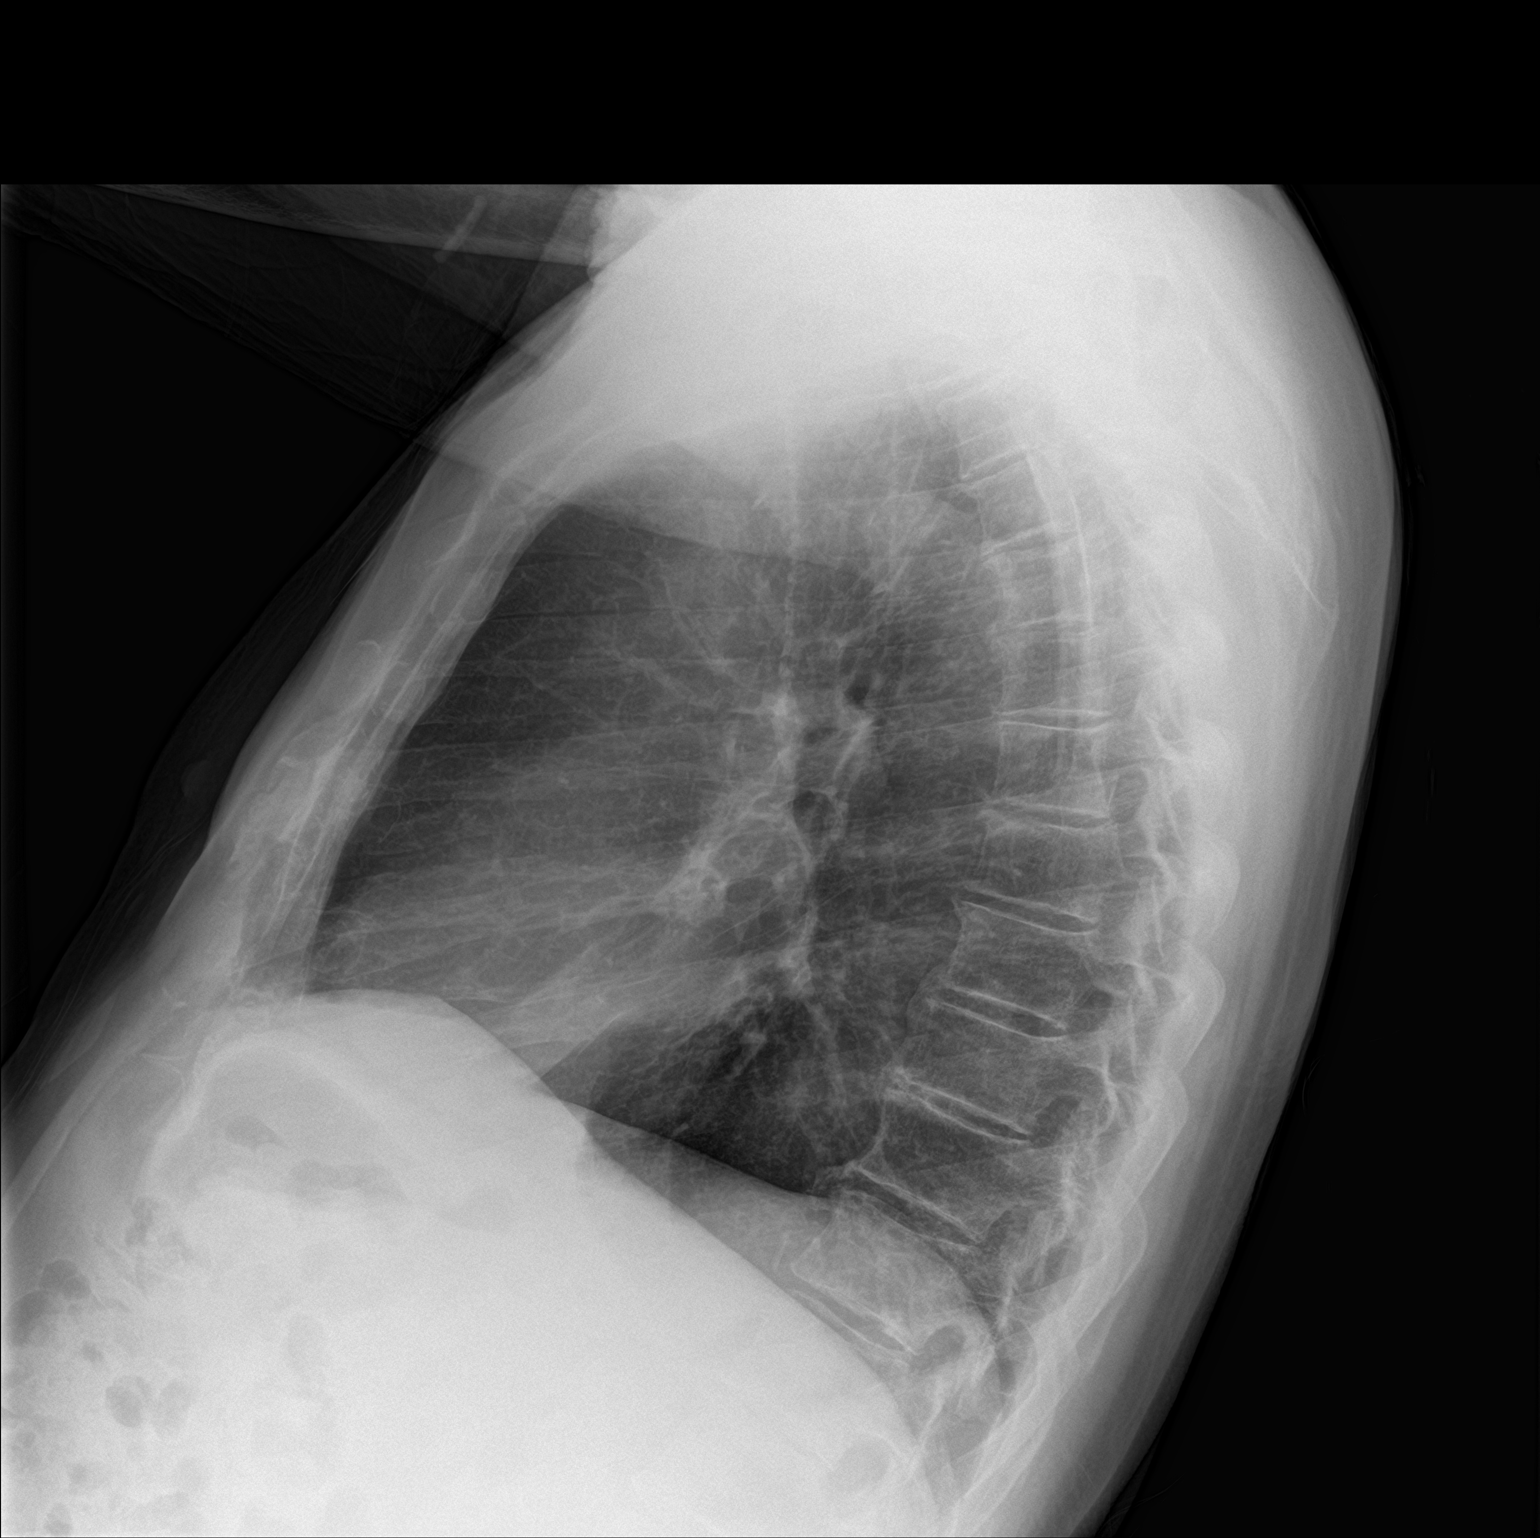

[2 of 2 positions shown; findings below may reference images not displayed]

FINDINGS: No focal consolidation, pleural effusion, or pneumothorax. The
cardiac silhouette is within limits atherosclerotic calcification of
the aorta. Degenerative changes of the spine. Old healed right rib
fractures. No acute osseous pathology.
IMPRESSION: No active cardiopulmonary disease.

## 2021-09-28 ENCOUNTER — Telehealth: Payer: Self-pay

## 2021-09-28 NOTE — Chronic Care Management (AMB) (Signed)
? ? ?  Chronic Care Management ?Pharmacy Assistant  ? ?Name: Caleb Fleming.  MRN: [id number] DOB: August 13, 1931 ? ?Reason for Encounter: Disease State General ? ? ?Recent office visits:  ?09/05/21-Karen Caren Griffins, NP (PCP) Seen for hyperlipidemia and hypertension. Labs ordered. Chest xray completed. Follow up in 3 months. ?06/05/21-Karen Caren Griffins, NP (PCP) General follow up visit. Labs ordered. Start on Zithromax 250 mg. Follow up in 3 months. ?04/21/21-Karen Caren Griffins, NP (PCP) Seen for urinary tract infection. Start on CIPRO 500 MG tablet twice daily. Stop Eliquis. ?04/10/21-Karen Caren Griffins, NP (PCP) Seen for hematuria. Start on MACROBID 100 MG capsule twice daily. ? ?Recent consult visits:  ?05/17/21-Brian C. Richardo Hanks, MD (Urology) Seen for Hematuria. Follow up in 3 weeks. ? ?Hospital visits:  ?None in previous 6 months ? ?Medications: ?Outpatient Encounter Medications as of 09/28/2021  ?Medication Sig  ? acetaminophen (TYLENOL) 325 MG tablet Take 650 mg by mouth every 4 (four) hours as needed.  ? allopurinol (ZYLOPRIM) 100 MG tablet TAKE 1 TABLET BY MOUTH EVERY DAY  ? amiodarone (PACERONE) 200 MG tablet TAKE 1 TABLET BY MOUTH EVERY DAY  ? Iron, Ferrous Sulfate, 325 (65 Fe) MG TABS Take 325 mg by mouth daily.  ? metoprolol succinate (TOPROL-XL) 25 MG 24 hr tablet Take 1 tablet (25 mg total) by mouth daily.  ? potassium chloride (KLOR-CON) 10 MEQ tablet TAKE 1 TABLET BY MOUTH 2 TIMES DAILY.  ? predniSONE (DELTASONE) 10 MG tablet Take 1 tablet (10 mg total) by mouth daily with breakfast. Take 6 tabs today, 5 tomorrow, and decrease by 1 each day until course is complete.  ? simvastatin (ZOCOR) 20 MG tablet TAKE 1 TABLET BY MOUTH DAILY AT 6 PM.  ? Zinc 220 (50 Zn) MG CAPS Take 1 capsule by mouth daily.  ? ?No facility-administered encounter medications on file as of 09/28/2021.  ? ?I have unsuccessfully attempted to complete assessment call. I have attempted 3x and left 3 voicemail's for the patient to return my  phone call when available. ? ? ?Care Gaps: ?None noted ? ?Star Rating Drugs: ?Simvastatin 20 mg Last filled: ? ?Rance Muir, RMA ?Health Concierge ? ?

## 2021-10-18 ENCOUNTER — Ambulatory Visit (INDEPENDENT_AMBULATORY_CARE_PROVIDER_SITE_OTHER): Payer: Medicare Other

## 2021-10-18 ENCOUNTER — Telehealth: Payer: Medicare Other

## 2021-10-18 ENCOUNTER — Telehealth: Payer: Self-pay

## 2021-10-18 DIAGNOSIS — I5020 Unspecified systolic (congestive) heart failure: Secondary | ICD-10-CM

## 2021-10-18 DIAGNOSIS — E782 Mixed hyperlipidemia: Secondary | ICD-10-CM

## 2021-10-18 DIAGNOSIS — I480 Paroxysmal atrial fibrillation: Secondary | ICD-10-CM

## 2021-10-18 DIAGNOSIS — I10 Essential (primary) hypertension: Secondary | ICD-10-CM

## 2021-10-18 NOTE — Chronic Care Management (AMB) (Signed)
? ? ?  Chronic Care Management ?Pharmacy Assistant  ? ?Name: Caleb Fleming.  MRN: [id number] DOB: 1931-10-20 ? ?Reason for Encounter: Disease State General ? ? ?Recent office visits:  ?09/05/21-Karen Caren Griffins, NP (PCP) Seen for hyperlipidemia and hypertension. Labs ordered. Chest xray completed. Follow up in 3 months. ?06/05/21-Karen Caren Griffins, NP (PCP) General follow up visit. Labs ordered. Start on Zithromax 250 mg. Follow up in 3 months. ?04/21/21-Karen Caren Griffins, NP (PCP) Seen for urinary tract infection. Start on CIPRO 500 MG tablet twice daily. Stop Eliquis. ?04/10/21-Karen Caren Griffins, NP (PCP) Seen for hematuria. Start on MACROBID 100 MG capsule twice daily. ? ?Recent consult visits:  ?05/17/21-Brian C. Richardo Hanks, MD (Urology) Seen for Hematuria. Follow up in 3 weeks. ? ?Hospital visits:  ?None in previous 6 months ? ?Medications: ?Outpatient Encounter Medications as of 10/18/2021  ?Medication Sig  ? acetaminophen (TYLENOL) 325 MG tablet Take 650 mg by mouth every 4 (four) hours as needed.  ? allopurinol (ZYLOPRIM) 100 MG tablet TAKE 1 TABLET BY MOUTH EVERY DAY  ? amiodarone (PACERONE) 200 MG tablet TAKE 1 TABLET BY MOUTH EVERY DAY  ? Iron, Ferrous Sulfate, 325 (65 Fe) MG TABS Take 325 mg by mouth daily.  ? metoprolol succinate (TOPROL-XL) 25 MG 24 hr tablet Take 1 tablet (25 mg total) by mouth daily.  ? potassium chloride (KLOR-CON) 10 MEQ tablet TAKE 1 TABLET BY MOUTH 2 TIMES DAILY.  ? predniSONE (DELTASONE) 10 MG tablet Take 1 tablet (10 mg total) by mouth daily with breakfast. Take 6 tabs today, 5 tomorrow, and decrease by 1 each day until course is complete.  ? simvastatin (ZOCOR) 20 MG tablet TAKE 1 TABLET BY MOUTH DAILY AT 6 PM.  ? Zinc 220 (50 Zn) MG CAPS Take 1 capsule by mouth daily.  ? ?No facility-administered encounter medications on file as of 10/18/2021.  ? ?Have you had any problems recently with your health? ?Patients relative states that the the patient has been having a lot of diarrhea  but has felt better today. She states the patient does have an appointment to see his PCP on 12/06/21. ? ?Have you had any problems with your pharmacy? ?Patients relative [name] stated she has switched his medication to mail pharmacy through his mail order [facility]. ? ?What issues or side effects are you having with your medications? ?Patients relative states he has no issues or side effects to any of his medications.  ? ?What would you like me to pass along to Gerilyn Pilgrim Potts,CPP for them to help you with?  ?Patient states there is nothing at this time.  ? ?What can we do to take care of you better? ?Patient states there is nothing at this time.  ? ?Care Gaps: ?None noted ? ?Star Rating Drugs: ?Simvastatin 20 mg Last filled:08/22/21 90 DS ? ?Rance Muir, RMA ?Health Concierge ? ?

## 2021-10-18 NOTE — Patient Instructions (Signed)
Visit Information ? ?Thank you for taking time to visit with me today. Please don't hesitate to contact me if I can be of assistance to you before our next scheduled telephone appointment. ? ?Following are the goals we discussed today:  ?RNCM Clinical Goal(s):  ?Patient will verbalize basic understanding of Atrial Fibrillation, CHF, HTN, and HLD disease process and self health management plan as evidenced by keeping appointments, regular lab work, calling the office for changes and working with the CCM team to effectively manage health and well being ?take all medications exactly as prescribed and will call provider for medication related questions as evidenced by compliance with medications and calling for refills before running out of medications    ?attend all scheduled medical appointments: with pcp and specialist as evidenced by keeping appointments and calling for schedule change needs.         ?demonstrate improved and ongoing adherence to prescribed treatment plan for Atrial Fibrillation, CHF, HTN, and HLD as evidenced by no acute exacerbations of conditions, stable VS, stable labs, and routine visits with providers ?demonstrate ongoing self health care management ability effectively manage health and well being as evidenced by working with the CCM team through collaboration with Consulting civil engineer, provider, and care team.  ?  ?Interventions: ?1:1 collaboration with primary care provider regarding development and update of comprehensive plan of care as evidenced by provider attestation and co-signature ?Inter-disciplinary care team collaboration (see longitudinal plan of care) ?Evaluation of current treatment plan related to  self management and patient's adherence to plan as established by provider ?  ?  ?A-fib:  (Status: Goal on Track (progressing): YES.) Long Term Goal  ?Counseled on increased risk of stroke due to Afib and benefits of anticoagulation for stroke prevention           ?Reviewed importance of  adherence to anticoagulant exactly as prescribed. 10-18-2021: The patients daughter in law states that the patient is now taking his Eliquis as directed. Denies any issues with medication compliance. Education and support given.  ?Advised patient to discuss inability to afford medications or changes in Afib with provider ?Counseled on bleeding risk associated with AFIB and importance of self-monitoring for signs/symptoms of bleeding ?Counseled on avoidance of NSAIDs due to increased bleeding risk with anticoagulants ?Counseled on importance of regular laboratory monitoring as prescribed ?Counseled on seeking medical attention after a head injury or if there is blood in the urine/stool ?Afib action plan reviewed ?Screening for signs and symptoms of depression related to chronic disease state ?Assessed social determinant of health barriers ?  ?Heart Failure Interventions:  (Status: Goal on Track (progressing): YES.)  Long Term Goal  ?   ?Wt Readings from Last 3 Encounters:  ?09/05/21 152 lb 3.2 oz (69 kg)  ?06/05/21 161 lb 6.4 oz (73.2 kg)  ?05/17/21 150 lb (68 kg)  ?  ?Basic overview and discussion of pathophysiology of Heart Failure reviewed ?Provided education on low sodium diet ?Reviewed Heart Failure Action Plan in depth and provided written copy ?Assessed need for readable accurate scales in home ?Provided education about placing scale on hard, flat surface ?Advised patient to weigh each morning after emptying bladder ?Discussed importance of daily weight and advised patient to weigh and record daily ?Reviewed role of diuretics in prevention of fluid overload and management of heart failure ?Discussed the importance of keeping all appointments with provider ?Provided patient with education about the role of exercise in the management of heart failure ?Advised patient to discuss changes in heart  health, swelling or edema with provider ?  ?Hyperlipidemia:  (Status: Goal on Track (progressing): YES.) Long Term Goal   ?     ?Lab Results  ?Component Value Date  ?  CHOL 129 09/05/2021  ?  HDL 44 09/05/2021  ?  [place] 66 09/05/2021  ?  TRIG 99 09/05/2021  ?  CHOLHDL 2.9 09/05/2021  ?  ?  ?Medication review performed; medication list updated in electronic medical record.  ?Provider established cholesterol goals reviewed; ?Counseled on importance of regular laboratory monitoring as prescribed; ?Provided HLD educational materials; ?Reviewed role and benefits of statin for ASCVD risk reduction; ?Discussed strategies to manage statin-induced myalgias; ?Reviewed importance of limiting foods high in cholesterol; ?  ?Hypertension: (Status: Goal on Track (progressing): YES.) Long Term Goal  ?Last practice recorded BP readings:  ?   ?BP Readings from Last 3 Encounters:  ?09/05/21 132/64  ?06/05/21 (!) 123/58  ?05/17/21 138/74  ?Most recent eGFR/CrCl:  ?     ?Lab Results  ?Component Value Date  ?  EGFR 56 (L) 09/05/2021  ?  No components found for: CRCL ?  ?Evaluation of current treatment plan related to hypertension self management and patient's adherence to plan as established by provider;   ?Provided education to patient re: stroke prevention, s/s of heart attack and stroke; ?Reviewed prescribed diet heart healthy ?Reviewed medications with patient and discussed importance of compliance;  ?Discussed plans with patient for ongoing care management follow up and provided patient with direct contact information for care management team; ?Advised patient, providing education and rationale, to monitor blood pressure daily and record, calling PCP for findings outside established parameters;  ?Reviewed scheduled/upcoming provider appointments including:  ?Advised patient to discuss changes in HTN health or heart health  with provider; ?Provided education on prescribed diet Heart healthy;  ?Discussed complications of poorly controlled blood pressure such as heart disease, stroke, circulatory complications, vision complications, kidney impairment,  sexual dysfunction;  ?  ?Patient Goals/Self-Care Activities: ?Take medications as prescribed   ?Attend all scheduled provider appointments ?Call pharmacy for medication refills 3-7 days in advance of running out of medications ?Attend church or other social activities ?Perform all self care activities independently  ?Perform IADL's (shopping, preparing meals, housekeeping, managing finances) independently ?Call provider office for new concerns or questions  ?Work with the Education officer, museum to address care coordination needs and will continue to work with the clinical team to address health care and disease management related needs ?call the Suicide and Crisis Lifeline: 988 ?call the [place] National Suicide Prevention Lifeline: 9594148813 or TTY: 626-117-8812 TTY (330)829-4260) to talk to a trained counselor ?call 1-800-273-TALK (toll free, 24 hour hotline) if experiencing a Mental Health or [place]  ?call office if I gain more than 2 pounds in one day or 5 pounds in one week ?keep legs up while sitting ?use salt in moderation ?watch for swelling in feet, ankles and legs every day ?develop a rescue plan ?follow rescue plan if symptoms flare-up ?eat more whole grains, fruits and vegetables, lean meats and healthy fats ?know when to call the doctorfor changes in HF sx and sx ?track symptoms and what helps feel better or worse ?dress right for the weather, hot or cold ?- check pulse (heart) rate once a day ?- cut down alcohol use ?- make a plan to eat healthy ?- keep all lab appointments ?- stop my alcohol use ?- take medicine as prescribed ?check blood pressure weekly ?choose a place to take my blood pressure (home,  clinic or office, retail store) ?call doctor for signs and symptoms of high blood pressure ?develop an action plan for high blood pressure ?keep all doctor appointments ?take medications for blood pressure exactly as prescribed ?report new symptoms to your doctor ?eat more whole grains,  fruits and vegetables, lean meats and healthy fats ?- call for medicine refill 2 or 3 days before it runs out ?- take all medications exactly as prescribed ?- call doctor with any symptoms you believe are

## 2021-10-18 NOTE — Chronic Care Management (AMB) (Signed)
?Chronic Care Management  ? ?CCM RN Visit Note ? ?10/18/2021 ?Name: Caleb Fleming. MRN: [id number] DOB: [birth year] ? ?Subjective: ?Caleb Fleming. is a 88 y.o. year old male who is a primary care patient of Jon Billings, NP. The care management team was consulted for assistance with disease management and care coordination needs.   ? ?Engaged with patient by telephone for follow up visit in response to provider referral for case management and/or care coordination services. Spoke to patients daughter in law [name] ? ?Consent to Services:  ?The patient was given information about Chronic Care Management services, agreed to services, and gave verbal consent prior to initiation of services.  Please see initial visit note for detailed documentation.  ? ?Patient agreed to services and verbal consent obtained.  ? ?Assessment: Review of patient past medical history, allergies, medications, health status, including review of consultants reports, laboratory and other test data, was performed as part of comprehensive evaluation and provision of chronic care management services.  ? ?SDOH (Social Determinants of Health) assessments and interventions performed:   ? ?CCM Care Plan ? ?Allergies  ?Allergen Reactions  ? Bactrim [Sulfamethoxazole-Trimethoprim] Itching  ? Codeine   ? ? ?Outpatient Encounter Medications as of 10/18/2021  ?Medication Sig  ? acetaminophen (TYLENOL) 325 MG tablet Take 650 mg by mouth every 4 (four) hours as needed.  ? allopurinol (ZYLOPRIM) 100 MG tablet TAKE 1 TABLET BY MOUTH EVERY DAY  ? amiodarone (PACERONE) 200 MG tablet TAKE 1 TABLET BY MOUTH EVERY DAY  ? Iron, Ferrous Sulfate, 325 (65 Fe) MG TABS Take 325 mg by mouth daily.  ? metoprolol succinate (TOPROL-XL) 25 MG 24 hr tablet Take 1 tablet (25 mg total) by mouth daily.  ? potassium chloride (KLOR-CON) 10 MEQ tablet TAKE 1 TABLET BY MOUTH 2 TIMES DAILY.  ? predniSONE (DELTASONE) 10 MG tablet Take 1 tablet (10 mg total) by mouth daily  with breakfast. Take 6 tabs today, 5 tomorrow, and decrease by 1 each day until course is complete.  ? simvastatin (ZOCOR) 20 MG tablet TAKE 1 TABLET BY MOUTH DAILY AT 6 PM.  ? Zinc 220 (50 Zn) MG CAPS Take 1 capsule by mouth daily.  ? ?No facility-administered encounter medications on file as of 10/18/2021.  ? ? ?Patient Active Problem List  ? Diagnosis Date Noted  ? Hematuria 04/21/2021  ? Aortic atherosclerosis ([place]) 10/04/2020  ? Laceration of scalp 07/21/2020  ? HFrEF (heart failure with reduced ejection fraction) ([place]) 05/18/2020  ? CKD (chronic kidney disease), stage III ([place]) 05/18/2020  ? Senile purpura ([place]) 05/18/2020  ? AF (paroxysmal atrial fibrillation) ([place]) 02/11/2020  ? History of COVID-19 02/11/2020  ? Alcohol abuse 12/25/2018  ? Benign prostatic hyperplasia 05/26/2015  ? Gout 05/26/2015  ? Hyperlipidemia 05/26/2015  ? Insomnia 05/26/2015  ? Hypogonadism in male 05/26/2015  ? Hypertension 05/26/2015  ? Anxiety and depression 05/26/2015  ? ? ?Conditions to be addressed/monitored:Atrial Fibrillation, CHF, HTN, and HLD ? ?Care Plan : RNCM: Heart Failure (Adult)  ?Updates made by Vanita Ingles, RN since 10/18/2021 12:00 AM  ?Completed 10/18/2021  ? ?Problem: RNCM: Symptom Exacerbation (Heart Failure) Resolved 10/18/2021  ?Priority: Medium  ?  ? ?Long-Range Goal: RNCM: Symptom Exacerbation Prevented or Minimized Completed 10/18/2021  ?Start Date: 10/26/2020  ?Expected End Date: 10/26/2021  ?Recent Progress: On track  ?Priority: Medium  ?Note:   ?Current Barriers: Resolving, duplicate goal ?Knowledge deficits related to basic heart failure pathophysiology and self care management ?Lacks social connections ?Does  not maintain contact with provider office ?Does not contact provider office for questions/concerns ?Lack of scale in home ?Financial strain ?EF% 35-40% ?Nurse Case Manager Clinical Goal(s):  ?patient will weigh self daily and record ?patient will verbalize understanding of Heart Failure Action Plan and  when to call doctor ?patient will take all Heart Failure mediations as prescribed ?Interventions:  ?Collaboration with Jon Billings, NP regarding development and update of comprehensive plan of care as evidenced by provider attestation and co-signature ?Inter-disciplinary care team collaboration (see longitudinal plan of care) ?Basic overview and discussion of pathophysiology of Heart Failure. 02-15-2021: The patient DIL helps manage the patients medications. He is compliant with medications at this time. States he is not having any swelling in his feet or legs. She is monitoring his meals and does not add salt to his foods. She states compliance with a heart healthy diet.  ?Provided written and verbal education on low sodium diet. 04-19-2021: Education and review. The patient is compliant with a heart healthy diet. The DIL closely monitors his dietary habits.  ?Reviewed Heart Failure Action Plan in depth and provided written copy ?Assessed for scales in home- has scales but does not weigh daily ?Discussed importance of daily weight ?Reviewed role of diuretics in prevention of fluid overload ?Self-Care Activities:  ?Takes Heart Failure Medications as prescribed ?Weighs daily and record (notifying MD of 3 lb weight gain over night or 5 lb in a week) ?Verbalizes understanding of and follows CHF Action Plan ?Adheres to low sodium diet  ?Patient Goals:  ?- Take Heart Failure Medications as prescribed ?- Weigh daily and record (notify MD with 3 lb weight gain over night or 5 lb in a week) ?- Follow CHF Action Plan ?- Adhere to low sodium diet ?- eat more whole grains, fruits and vegetables, lean meats and healthy fats ?- follow rescue plan if symptoms flare-up ?- know when to call the doctor ?- track symptoms and what helps feel better or worse ?- dress right for the weather, hot or cold ?- avoid heavy exercise on very hot days ?- drink water to stay hydrated during exercise ?- follow activity or exercise plan ?-  make an activity or exercise plan ?- pace activity allowing for rest ?- warm up and cool down for 10 minutes before and after exercise ?- call office if I gain more than 2 pounds in one day or 5 pounds in one week ?- keep legs up while sitting ?- use salt in moderation ?- watch for swelling in feet, ankles and legs every day ?- weigh myself daily ?barriers to lifestyle changes reviewed and addressed ?- barriers to treatment reviewed and addressed ?- cognitive screening completed and reviewed ?- depression screen reviewed ?- health literacy screening completed or reviewed ?- healthy lifestyle promoted ?- medication-adherence assessment completed ?- rescue (action) plan developed ?- rescue (action) plan reviewed ?- self-awareness of signs/symptoms of worsening disease encouraged  ?  ?Notes: lives with son and daughter in law who help manage his care ?Follow Up Plan: Telephone follow up appointment with care management team member scheduled for: 06-21-2021 at 1145 am ?  ? ?Care Plan : RNCM: Hypertension (Adult)  ?Updates made by Vanita Ingles, RN since 10/18/2021 12:00 AM  ?Completed 10/18/2021  ? ?Problem: RNCM: Hypertension (Hypertension) Resolved 10/18/2021  ?Priority: Medium  ?  ? ?Long-Range Goal: RNCM: Hypertension Monitored Completed 10/18/2021  ?Start Date: 10/26/2020  ?Expected End Date: 10/26/2021  ?Recent Progress: On track  ?Priority: Medium  ?Note:   ?Objective:  Resolving, duplicate goal ?Last practice recorded BP readings:  ?BP Readings from Last 3 Encounters:  ?04/10/21 (!) 144/67  ?03/01/21 130/78  ?11/29/20 124/63  ? ?Most recent eGFR/CrCl: No results found for: EGFR  No components found for: CRCL ?Current Barriers:  ?Knowledge Deficits related to basic understanding of hypertension pathophysiology and self care management ?Knowledge Deficits related to understanding of medications prescribed for management of hypertension ?Limited Social Support ?Unable to independently manage HTN ?Unable to self  administer medications as prescribed ?Lacks social connections ?Does not maintain contact with provider office ?Does not contact provider office for questions/concerns ?Case Manager Clinical Goal(s):  ?patient will

## 2021-10-26 ENCOUNTER — Telehealth: Payer: Self-pay

## 2021-10-26 MED ORDER — IRON (FERROUS SULFATE) 325 (65 FE) MG PO TABS: 325.0000 mg | ORAL_TABLET | Freq: Every day | ORAL | 1 refills | Status: DC

## 2021-10-26 MED ORDER — METOPROLOL SUCCINATE ER 25 MG PO TB24: 25.0000 mg | ORAL_TABLET | Freq: Every day | ORAL | 1 refills | Status: DC

## 2021-10-26 MED ORDER — ALLOPURINOL 100 MG PO TABS: 100.0000 mg | ORAL_TABLET | Freq: Every day | ORAL | 2 refills | Status: DC

## 2021-10-26 MED ORDER — POTASSIUM CHLORIDE ER 10 MEQ PO TBCR: EXTENDED_RELEASE_TABLET | ORAL | 1 refills | Status: DC

## 2021-10-26 MED ORDER — SIMVASTATIN 20 MG PO TABS: ORAL_TABLET | ORAL | 1 refills | Status: DC

## 2021-10-26 MED ORDER — AMIODARONE HCL 200 MG PO TABS: 200.0000 mg | ORAL_TABLET | Freq: Every day | ORAL | 1 refills | Status: DC

## 2021-10-26 NOTE — Telephone Encounter (Signed)
Meds sent to the pharmacy  

## 2021-10-26 NOTE — Telephone Encounter (Signed)
Medications sent to the pharmacy.

## 2021-11-20 ENCOUNTER — Other Ambulatory Visit: Payer: Self-pay | Admitting: Nurse Practitioner

## 2021-11-21 NOTE — Telephone Encounter (Signed)
Refused this Iron, Ferrous Sulfate 325 mg tabs from [id number] because this was sent to the mail order pharmacy on 10/26/2021 #90, 1 refill. ?

## 2021-11-27 ENCOUNTER — Telehealth: Payer: Medicare Other

## 2021-12-05 ENCOUNTER — Ambulatory Visit (INDEPENDENT_AMBULATORY_CARE_PROVIDER_SITE_OTHER): Payer: Medicare Other

## 2021-12-05 DIAGNOSIS — E782 Mixed hyperlipidemia: Secondary | ICD-10-CM

## 2021-12-05 DIAGNOSIS — I480 Paroxysmal atrial fibrillation: Secondary | ICD-10-CM

## 2021-12-05 NOTE — Progress Notes (Deleted)
There were no vitals taken for this visit.   Subjective:    Patient ID: Caleb Zehnder., male    DOB: Aug 29, 1931, 88 y.o.   MRN: [id number]  HPI: Caleb Fleming. is a 88 y.o. male  No chief complaint on file.  HYPERTENSION / HYPERLIPIDEMIA Satisfied with current treatment? yes Duration of hypertension: years BP monitoring frequency: not checking BP range:  BP medication side effects: no Past BP meds:  Metoprolol Duration of hyperlipidemia: years Cholesterol medication side effects: no Cholesterol supplements: none Past cholesterol medications: simvastatin (zocor) Medication compliance: excellent compliance Aspirin: no Recent stressors: no Recurrent headaches: no Visual changes: no Palpitations: no Dyspnea: no Chest pain: no Lower extremity edema: no Dizzy/lightheaded: no   AFIB Rate Stable.  Denies concerns.  Does not see Cardiology. Not currently on Eliquis due to hematuria.  ANXIETY AND DEPRESSION Patient denies SI/HI in office today. Denies feeling down or anxious.  Feels like everyday is a blessing.  Patient states he has had a cough for about 2 weeks.  He was sick for about 2 weeks but now the cough is lingering and he can't get rid of it.  He is having SOB and runny nose.    Relevant past medical, surgical, family and social history reviewed and updated as indicated. Interim medical history since our last visit reviewed. Allergies and medications reviewed and updated.  Review of Systems  HENT:  Positive for congestion and rhinorrhea.   Eyes:  Negative for visual disturbance.  Respiratory:  Positive for cough and shortness of breath.   Cardiovascular:  Negative for chest pain and leg swelling.  Neurological:  Negative for light-headedness and headaches.  Psychiatric/Behavioral:  Negative for dysphoric mood and suicidal ideas. The patient is not nervous/anxious.    Per HPI unless specifically indicated above     Objective:    There were no  vitals taken for this visit.  Wt Readings from Last 3 Encounters:  09/05/21 152 lb 3.2 oz (69 kg)  06/05/21 161 lb 6.4 oz (73.2 kg)  05/17/21 150 lb (68 kg)    Physical Exam Vitals and nursing note reviewed.  Constitutional:      General: He is not in acute distress.    Appearance: Normal appearance. He is not ill-appearing, toxic-appearing or diaphoretic.  HENT:     Head: Normocephalic.     Right Ear: External ear normal.     Left Ear: External ear normal.     Nose: Nose normal. No congestion or rhinorrhea.     Mouth/Throat:     Mouth: Mucous membranes are moist.  Eyes:     General:        Right eye: No discharge.        Left eye: No discharge.     Extraocular Movements: Extraocular movements intact.     Conjunctiva/sclera: Conjunctivae normal.     Pupils: Pupils are equal, round, and reactive to light.  Cardiovascular:     Rate and Rhythm: Normal rate and regular rhythm.     Heart sounds: No murmur heard. Pulmonary:     Effort: Pulmonary effort is normal. No respiratory distress.     Breath sounds: Rhonchi present. No wheezing or rales.  Abdominal:     General: Abdomen is flat. Bowel sounds are normal.  Musculoskeletal:     Cervical back: Normal range of motion and neck supple.  Skin:    General: Skin is warm and dry.     Capillary Refill: Capillary refill  takes less than 2 seconds.  Neurological:     General: No focal deficit present.     Mental Status: He is alert and oriented to person, place, and time.  Psychiatric:        Mood and Affect: Mood normal.        Behavior: Behavior normal.        Thought Content: Thought content normal.        Judgment: Judgment normal.    Results for orders placed or performed in visit on 09/05/21  Comp Met (CMET)  Result Value Ref Range   Glucose 84 70 - 99 mg/dL   BUN 15 8 - 27 mg/dL   Creatinine, Ser 1.25 0.76 - 1.27 mg/dL   eGFR 56 (L) >59 mL/min/1.73   BUN/Creatinine Ratio 12 10 - 24   Sodium 140 134 - 144 mmol/L    Potassium 3.9 3.5 - 5.2 mmol/L   Chloride 106 96 - 106 mmol/L   CO2 21 20 - 29 mmol/L   Calcium 8.6 8.6 - 10.2 mg/dL   Total Protein 5.8 (L) 6.0 - 8.5 g/dL   Albumin 3.8 3.6 - 4.6 g/dL   Globulin, Total 2.0 1.5 - 4.5 g/dL   Albumin/Globulin Ratio 1.9 1.2 - 2.2   Bilirubin Total 0.3 0.0 - 1.2 mg/dL   Alkaline Phosphatase 88 44 - 121 IU/L   AST 63 (H) 0 - 40 IU/L   ALT 75 (H) 0 - 44 IU/L  Lipid Profile  Result Value Ref Range   Cholesterol, Total 129 100 - 199 mg/dL   Triglycerides 99 0 - 149 mg/dL   HDL 44 >39 mg/dL   VLDL Cholesterol Cal 19 5 - 40 mg/dL   LDL Chol Calc (NIH) 66 0 - 99 mg/dL   Chol/HDL Ratio 2.9 0.0 - 5.0 ratio  CBC w/Diff  Result Value Ref Range   WBC 7.7 3.4 - 10.8 x10E3/uL   RBC 4.79 4.14 - 5.80 x10E6/uL   Hemoglobin 14.0 13.0 - 17.7 g/dL   Hematocrit 42.4 37.5 - 51.0 %   MCV 89 79 - 97 fL   MCH 29.2 26.6 - 33.0 pg   MCHC 33.0 31.5 - 35.7 g/dL   RDW 19.3 (H) 11.6 - 15.4 %   Platelets 246 150 - 450 x10E3/uL   Neutrophils 62 Not Estab. %   Lymphs 19 Not Estab. %   Monocytes 12 Not Estab. %   Eos 5 Not Estab. %   Basos 1 Not Estab. %   Neutrophils Absolute 4.7 1.4 - 7.0 x10E3/uL   Lymphocytes Absolute 1.5 0.7 - 3.1 x10E3/uL   Monocytes Absolute 0.9 0.1 - 0.9 x10E3/uL   EOS (ABSOLUTE) 0.4 0.0 - 0.4 x10E3/uL   Basophils Absolute 0.1 0.0 - 0.2 x10E3/uL   Immature Granulocytes 1 Not Estab. %   Immature Grans (Abs) 0.1 0.0 - 0.1 x10E3/uL      Assessment & Plan:   Problem List Items Addressed This Visit      Cardiovascular and Mediastinum   AF (paroxysmal atrial fibrillation) (HCC)   HFrEF (heart failure with reduced ejection fraction) (HCC) - Primary   Aortic atherosclerosis (HCC)     Genitourinary   CKD (chronic kidney disease), stage III (HCC)     Other   Hyperlipidemia   Anxiety and depression     Follow up plan: No follow-ups on file.

## 2021-12-05 NOTE — Progress Notes (Signed)
Chronic Care Management Pharmacy Note  12/06/2021 Name:  Caleb Fleming. MRN:  [id number] DOB:  05-23-1932  Summary: *PCP appt 5/31* If starting back on [facility] (previous blood in urine), have two options for financial assistance: Eliquis PAP - need to make 43,700/yr or less AND spend 3% income on own Rxs year to date Xarelto - there is no PAP. Only reduced copay assistance - so if copay is >$85/30 days or > $240/90 days, [employer] program will reduce to $85 and $240 April to December. Program phone is [phone]. Patient can call and self enroll if eligible as above  DIL needing to find out income/OOP spending. Appt tomorrow with PCP - HC call 1 week   Subjective: Caleb Fleming. is an 88 y.o. year old male who is a primary patient of Jon Billings, NP.  The CCM team was consulted for assistance with disease management and care coordination needs.    Engaged with patient by telephone for follow up visit in response to provider referral for pharmacy case management and/or care coordination services.   Consent to Services:  The patient was given information about Chronic Care Management services, agreed to services, and gave verbal consent prior to initiation of services.  Please see initial visit note for detailed documentation.   Patient Care Team: Jon Billings, NP as PCP - General Vanita Ingles, RN as Case Manager ([place])  Hospital visits: None in previous 6 months  Objective:  Lab Results  Component Value Date   CREATININE 1.25 09/05/2021   CREATININE 1.64 (H) 06/05/2021   CREATININE 1.44 (H) 03/01/2021    No results found for: HGBA1C Last diabetic Eye exam: No results found for: HMDIABEYEEXA  Last diabetic Foot exam: No results found for: HMDIABFOOTEX      Component Value Date/Time   CHOL 129 09/05/2021 0842   CHOL 124 06/05/2021 1122   CHOL 140 03/01/2021 1056   TRIG 99 09/05/2021 0842   TRIG 78 06/05/2021 1122   TRIG 82  03/01/2021 1056   HDL 44 09/05/2021 0842   HDL 60 06/05/2021 1122   HDL 53 03/01/2021 1056   CHOLHDL 2.9 09/05/2021 0842   LDLCALC 66 09/05/2021 0842   LDLCALC 49 06/05/2021 1122   LDLCALC 71 03/01/2021 1056       Latest Ref Rng & Units 09/05/2021    8:42 AM 06/05/2021   11:22 AM 03/01/2021   10:56 AM  Hepatic Function  Total Protein 6.0 - 8.5 g/dL 5.8   5.8   6.1    Albumin 3.6 - 4.6 g/dL 3.8   4.0   4.0    AST 0 - 40 IU/L 63   116   46    ALT 0 - 44 IU/L 75   113   37    Alk Phosphatase 44 - 121 IU/L 88   90   98    Total Bilirubin 0.0 - 1.2 mg/dL 0.3   <0.2   0.3      Lab Results  Component Value Date/Time   TSH 0.781 05/18/2020 11:55 AM   TSH 1.608 12/09/2018 03:21 AM       Latest Ref Rng & Units 09/05/2021    8:42 AM 06/05/2021   11:22 AM 03/01/2021   10:56 AM  CBC  WBC 3.4 - 10.8 x10E3/uL 7.7   6.9   7.9    Hemoglobin 13.0 - 17.7 g/dL 14.0   9.7   12.1    Hematocrit 37.5 -  51.0 % 42.4   31.9   38.2    Platelets 150 - 450 x10E3/uL 246   226   277      No results found for: VD25OH  Clinical ASCVD:  The ASCVD Risk score (Arnett DK, et al., 2019) failed to calculate for the following reasons:   The 2019 ASCVD risk score is only valid for ages 42 to 28   Social History   Tobacco Use  Smoking Status Former  Smokeless Tobacco Current   Types: Chew   BP Readings from Last 3 Encounters:  09/05/21 132/64  06/05/21 (!) 123/58  05/17/21 138/74   Pulse Readings from Last 3 Encounters:  09/05/21 (!) 45  06/05/21 (!) 51  05/17/21 (!) 50   Wt Readings from Last 3 Encounters:  09/05/21 152 lb 3.2 oz (69 kg)  06/05/21 161 lb 6.4 oz (73.2 kg)  05/17/21 150 lb (68 kg)   BMI Readings from Last 3 Encounters:  09/05/21 24.49 kg/m  06/05/21 25.97 kg/m  05/17/21 23.49 kg/m    Assessment: Review of patient past medical history, allergies, medications, health status, including review of consultants reports, laboratory and other test data, was performed as part of  comprehensive evaluation and provision of chronic care management services.   SDOH:  (Social Determinants of Health) assessments and interventions performed: Yes.  SDOH Interventions    Flowsheet Row Most Recent Value  SDOH Interventions   Food Insecurity Interventions Intervention Not Indicated  Transportation Interventions Intervention Not Indicated      Care Gaps: -none noted  STAR Meds: N/a   CCM Care Plan  Allergies  Allergen Reactions   Bactrim [Sulfamethoxazole-Trimethoprim] Itching   Codeine     Medications Reviewed Today     Reviewed by Vanita Ingles, RN (Case Manager) on 10/18/21 at 1059  Med List Status: <None>   Medication Order Taking? Sig Documenting Provider Last Dose Status Informant  acetaminophen (TYLENOL) 325 MG tablet [id number] No Take 650 mg by mouth every 4 (four) hours as needed. [provider] Taking Active Other  allopurinol (ZYLOPRIM) 100 MG tablet [id number] No TAKE 1 TABLET BY MOUTH EVERY DAY Jon Billings, NP Taking Active   amiodarone (PACERONE) 200 MG tablet [id number] No TAKE 1 TABLET BY MOUTH EVERY DAY Jon Billings, NP Taking Active   Iron, Ferrous Sulfate, 325 (65 Fe) MG TABS [id number] No Take 325 mg by mouth daily. Jon Billings, NP Taking Active   metoprolol succinate (TOPROL-XL) 25 MG 24 hr tablet [id number] No Take 1 tablet (25 mg total) by mouth daily. Jon Billings, NP Taking Active   potassium chloride (KLOR-CON) 10 MEQ tablet [id number] No TAKE 1 TABLET BY MOUTH 2 TIMES DAILY. Jon Billings, NP Taking Active   predniSONE (DELTASONE) 10 MG tablet [id number]  Take 1 tablet (10 mg total) by mouth daily with breakfast. Take 6 tabs today, 5 tomorrow, and decrease by 1 each day until course is complete. Jon Billings, NP  Active   simvastatin (ZOCOR) 20 MG tablet [id number] No TAKE 1 TABLET BY MOUTH DAILY AT 6 PM. Jon Billings, NP Taking Active   Zinc 220 (50 Zn) MG CAPS [id number] No Take 1 capsule by  mouth daily. [provider] Taking Active             Patient Active Problem List   Diagnosis Date Noted   Hematuria 04/21/2021   Aortic atherosclerosis ([place]) 10/04/2020   Laceration of scalp 07/21/2020   HFrEF (heart failure with reduced ejection fraction) ([place]) 05/18/2020  CKD (chronic kidney disease), stage III ([place]) 05/18/2020   Senile purpura ([place]) 05/18/2020   AF (paroxysmal atrial fibrillation) ([place]) 02/11/2020   History of COVID-19 02/11/2020   Alcohol abuse 12/25/2018   Benign prostatic hyperplasia 05/26/2015   Gout 05/26/2015   Hyperlipidemia 05/26/2015   Insomnia 05/26/2015   Hypogonadism in male 05/26/2015   Hypertension 05/26/2015   Anxiety and depression 05/26/2015    Immunization History  Administered Date(s) Administered   Fluad Quad(high Dose 65+) 04/12/2021   Influenza, High Dose Seasonal PF 05/27/2017, 03/24/2018   Influenza-Unspecified 09/16/2019   PFIZER(Purple Top)SARS-COV-2 Vaccination 01/29/2020   Pneumococcal Conjugate-13 09/23/2019   Pneumococcal Polysaccharide-23 03/13/2006, 05/09/2015   Td 03/13/2006   Tdap 05/09/2015, 07/17/2020   Zoster, Live 02/28/2007    Conditions to be addressed/monitored: HLD CHF HTN CKD GAD/MDD  Care Plan : ccm pharmacy care plan  Updates made by Madelin Rear, [facility] since 12/06/2021 12:00 AM     Problem: HLD CHF HTN CKD GAD/MDD      Long-Range Goal: disease management   Start Date: 12/07/2022  This Visit's Progress: On track  Note:   Current Barriers:  Unable to independently afford treatment regimen  Pharmacist Clinical Goal(s):  Patient will verbalize ability to afford treatment regimen through collaboration with PharmD and provider.   Interventions: 1:1 collaboration with Jon Billings, NP regarding development and update of comprehensive plan of care as evidenced by provider attestation and co-signature Inter-disciplinary care team collaboration (see longitudinal plan of  care) Comprehensive medication review performed; medication list updated in electronic medical record  Hyperlipidemia: (LDL goal < 70) -Controlled -Current treatment: Simvastatin 20 mg once daily  Appropriate, Effective, Safe, Accessible -Medications previously tried:   -Current dietary patterns: lots of water, eating eggs, breakfast bowls. Eats most meals without salt.  -Current exercise habits: active outside, fills feeders for birds, squirrel  -Educated on Cholesterol goals;  -Recommended to continue current medication  Atrial Fibrillation (Goal: prevent stroke and major bleeding) -Controlled -Current treatment: Rate control: Metoprolol Appropriate, Effective, Safe, Accessible Anticoagulation: Eliquis (holding d/t blood in urine - reassessing w/ PCP 5/31) Appropriate, Effective, Query Safe -Medications previously tried:  -Home BP and HR readings: BP <737 systolic at home, HR not provided  -Counseled on increased risk of stroke due to Afib and benefits of anticoagulation for stroke prevention; seeking medical attention after a head injury or if there is blood in the urine/stool; -Recommended to continue current medication   Patient Goals/Self-Care Activities Patient will:  - take medications as prescribed as evidenced by patient report and record review  Medication Assistance: TBD - if restarting on [facility], can attempt to support through [facility] PAP for eliquis or janssen select for Xarelto. HC to call patient's daughter in 1 week/review chart for next steps.  Patient's preferred pharmacy [facility]:  [id number] [place] [id number]: [phone]: [phone]     Pt endorses 100% compliance  Follow Up:  Patient agrees to Care Plan and Follow-up. Plan: HC 1 week pt call (see med assistance). Pharmacist 618mel  Future Appointments  Date Time Provider [place]5/31/2023 10:20 AM HoJon BillingsNP CFP-CFP [facility] 12/27/2021 10:30 AM CFP CCM CASE MANAGER CFP-CFP PEC  04/27/2022  8:15 AM CFP NURSE HEALTH ADVISOR CFP-CFP PESpringertonPharmD, BCOcean Beach Hospitallinical Pharmacist  CrB[facility] Centerractice  [phone]

## 2021-12-06 ENCOUNTER — Telehealth: Payer: Medicare Other | Admitting: Nurse Practitioner

## 2021-12-06 DIAGNOSIS — I700 Atherosclerosis of aorta: Secondary | ICD-10-CM

## 2021-12-06 DIAGNOSIS — N1832 Chronic kidney disease, stage 3b: Secondary | ICD-10-CM

## 2021-12-06 DIAGNOSIS — E785 Hyperlipidemia, unspecified: Secondary | ICD-10-CM

## 2021-12-06 DIAGNOSIS — E782 Mixed hyperlipidemia: Secondary | ICD-10-CM

## 2021-12-06 DIAGNOSIS — I5020 Unspecified systolic (congestive) heart failure: Secondary | ICD-10-CM

## 2021-12-06 DIAGNOSIS — F32A Depression, unspecified: Secondary | ICD-10-CM

## 2021-12-06 DIAGNOSIS — I480 Paroxysmal atrial fibrillation: Secondary | ICD-10-CM

## 2021-12-06 DIAGNOSIS — I4891 Unspecified atrial fibrillation: Secondary | ICD-10-CM

## 2021-12-06 NOTE — Patient Instructions (Addendum)
Caleb Fleming,  Thank you for talking with me today. I have included our care plan/goals in the following pages.   Please review and call me at [phone] with any questions.  Thanks! Johnell Comings, PharmD Clinical Pharmacist  [phone]  Care Plan : ccm pharmacy care plan  Updates made by Dahlia Byes, [facility] since 12/06/2021 12:00 AM     Problem: HLD CHF HTN CKD GAD/MDD      Long-Range Goal: disease management   Start Date: 12/07/2022  This Visit's Progress: On track  Note:   Current Barriers:  Unable to independently afford treatment regimen  Pharmacist Clinical Goal(s):  Patient will verbalize ability to afford treatment regimen through collaboration with PharmD and provider.   Interventions: 1:1 collaboration with Larae Grooms, NP regarding development and update of comprehensive plan of care as evidenced by provider attestation and co-signature Inter-disciplinary care team collaboration (see longitudinal plan of care) Comprehensive medication review performed; medication list updated in electronic medical record  Hyperlipidemia: (LDL goal < 70) -Controlled -Current treatment: Simvastatin 20 mg once daily  Appropriate, Effective, Safe, Accessible -Medications previously tried:   -Current dietary patterns: lots of water, eating eggs, breakfast bowls. Eats most meals without salt.  -Current exercise habits: active outside, fills feeders for birds, squirrel  -Educated on Cholesterol goals;  -Recommended to continue current medication  Atrial Fibrillation (Goal: prevent stroke and major bleeding) -Controlled -Current treatment: Rate control: Metoprolol Appropriate, Effective, Safe, Accessible Anticoagulation: Eliquis (holding d/t blood in urine - reassessing w/ PCP 5/31) Appropriate, Effective, Query Safe -Medications previously tried:  -Home BP and HR readings: BP <130 systolic at home, HR not provided  -Counseled on increased risk of stroke due  to Afib and benefits of anticoagulation for stroke prevention; seeking medical attention after a head injury or if there is blood in the urine/stool; -Recommended to continue current medication   Patient Goals/Self-Care Activities Patient will:  - take medications as prescribed as evidenced by patient report and record review  Medication Assistance: TBD - if restarting on [facility], can attempt to support through [employer] PAP for eliquis or janssen select for Xarelto. HC to call patient's daughter in 1 week/review chart for next steps.  Patient's preferred pharmacy is:  [id number] [place] [place] - [address] [place] [address] Phone: [phone] Fax: [phone]       The patient verbalized understanding of instructions provided today and agreed to receive a MyChart copy of patient instruction and/or educational materials. Telephone follow up appointment with pharmacy team member scheduled for: See next appointment with "Care Management Staff" under "What's Next" below.

## 2021-12-11 ENCOUNTER — Other Ambulatory Visit: Payer: Self-pay | Admitting: Nurse Practitioner

## 2021-12-12 NOTE — Telephone Encounter (Signed)
LRF 10/26/21  #90 1 refill. Early request sent via Interface Requested Prescriptions  Pending Prescriptions Disp Refills  . ferrous sulfate 325 (65 FE) MG tablet [Pharmacy Med Name: FERROUS SULFATE 325 MG TABLET] 90 tablet 1    Sig: TAKE 1 TABLET BY MOUTH EVERY DAY     Endocrinology:  Minerals - Iron Supplementation Failed - 12/11/2021  4:45 PM      Failed - Fe (serum) in normal range and within 360 days    No results found for: IRON, IRONPCTSAT       Failed - Ferritin in normal range and within 360 days    Ferritin  Date Value Ref Range Status  02/15/2020 161 24 - 336 ng/mL Final    Comment:    Performed at [facility], [address], [place], [place] [address]         Passed - HGB in normal range and within 360 days    Hemoglobin  Date Value Ref Range Status  09/05/2021 14.0 13.0 - 17.7 g/dL Final         Passed - HCT in normal range and within 360 days    Hematocrit  Date Value Ref Range Status  09/05/2021 42.4 37.5 - 51.0 % Final         Passed - RBC in normal range and within 360 days    RBC  Date Value Ref Range Status  09/05/2021 4.79 4.14 - 5.80 x10E6/uL Final  07/17/2020 3.87 (L) 4.22 - 5.81 MIL/uL Final         Passed - Valid encounter within last 12 months    Recent Outpatient Visits          3 months ago Primary hypertension   [facility] Larae Grooms, NP   6 months ago Hematuria, unspecified type   [facility] Larae Grooms, NP   7 months ago Hematuria, unspecified type   [facility] Larae Grooms, NP   8 months ago Acute cystitis with hematuria   [facility] Larae Grooms, NP   9 months ago Primary hypertension   [facility] Larae Grooms, NP      Future Appointments            In 1 week Larae Grooms, NP [facility], PEC   In 4 months  [employer], PEC

## 2021-12-20 NOTE — Progress Notes (Signed)
BP 130/72   Pulse (!) 40   Temp 97.8 F (36.6 C) (Oral)   Wt 159 lb (72.1 kg)   SpO2 98%   BMI 25.59 kg/m    Subjective:    Patient ID: Caleb Platt., male    DOB: 13-Aug-1931, 88 y.o.   MRN: [id number]  HPI: Caleb Fleming is a 88 y.o. male  Chief Complaint  Patient presents with   Hypertension    3 month follow up    Hyperlipidemia   Diabetes   HYPERTENSION / [place] Satisfied with current treatment? yes Duration of hypertension: years BP monitoring frequency: not checking BP range:  BP medication side effects: no Past BP meds:  Metoprolol Duration of hyperlipidemia: years Cholesterol medication side effects: no Cholesterol supplements: none Past cholesterol medications: simvastatin (zocor) Medication compliance: excellent compliance Aspirin: no Recent stressors: no Recurrent headaches: no Visual changes: no Palpitations: no Dyspnea: no Chest pain: no Lower extremity edema: no Dizzy/lightheaded: no   AFIB Rate Stable.  Denies concerns.  Does not see Cardiology. Not currently on Eliquis due to hematuria.  ANXIETY AND DEPRESSION Patient denies SI/HI in office today. Denies feeling down or anxious.  Feels like everyday is a blessing.  He appreciates getting out of bed everyday.    Relevant past medical, surgical, family and social history reviewed and updated as indicated. Interim medical history since our last visit reviewed. Allergies and medications reviewed and updated.  Review of Systems  Eyes:  Negative for visual disturbance.  Respiratory:  Negative for cough and shortness of breath.   Cardiovascular:  Negative for chest pain and leg swelling.  Neurological:  Negative for light-headedness and headaches.  Psychiatric/Behavioral:  Negative for dysphoric mood and suicidal ideas. The patient is not nervous/anxious.     Per HPI unless specifically indicated above     Objective:    BP 130/72   Pulse (!) 40   Temp 97.8 F  (36.6 C) (Oral)   Wt 159 lb (72.1 kg)   SpO2 98%   BMI 25.59 kg/m   Wt Readings from Last 3 Encounters:  12/21/21 159 lb (72.1 kg)  09/05/21 152 lb 3.2 oz (69 kg)  06/05/21 161 lb 6.4 oz (73.2 kg)    Physical Exam Vitals and nursing note reviewed.  Constitutional:      General: He is not in acute distress.    Appearance: Normal appearance. He is not ill-appearing, toxic-appearing or diaphoretic.  HENT:     Head: Normocephalic.     Right Ear: External ear normal.     Left Ear: External ear normal.     Nose: Nose normal. No congestion or rhinorrhea.     Mouth/Throat:     Mouth: Mucous membranes are moist.  Eyes:     General:        Right eye: No discharge.        Left eye: No discharge.     Extraocular Movements: Extraocular movements intact.     Conjunctiva/sclera: Conjunctivae normal.     Pupils: Pupils are equal, round, and reactive to light.  Cardiovascular:     Rate and Rhythm: Normal rate and regular rhythm.     Heart sounds: No murmur heard. Pulmonary:     Effort: Pulmonary effort is normal. No respiratory distress.     Breath sounds: Normal breath sounds. No wheezing, rhonchi or rales.  Abdominal:     General: Abdomen is flat. Bowel sounds are normal.  Musculoskeletal:  Cervical back: Normal range of motion and neck supple.  Skin:    General: Skin is warm and dry.     Capillary Refill: Capillary refill takes less than 2 seconds.  Neurological:     General: No focal deficit present.     Mental Status: He is alert and oriented to person, place, and time.  Psychiatric:        Mood and Affect: Mood normal.        Behavior: Behavior normal.        Thought Content: Thought content normal.        Judgment: Judgment normal.     Results for orders placed or performed in visit on 09/05/21  Comp Met (CMET)  Result Value Ref Range   Glucose 84 70 - 99 mg/dL   BUN 15 8 - 27 mg/dL   Creatinine, Ser 1.25 0.76 - 1.27 mg/dL   eGFR 56 (L) >59 mL/min/1.73    BUN/Creatinine Ratio 12 10 - 24   Sodium 140 134 - 144 mmol/L   Potassium 3.9 3.5 - 5.2 mmol/L   Chloride 106 96 - 106 mmol/L   CO2 21 20 - 29 mmol/L   Calcium 8.6 8.6 - 10.2 mg/dL   Total Protein 5.8 (L) 6.0 - 8.5 g/dL   Albumin 3.8 3.6 - 4.6 g/dL   Globulin, Total 2.0 1.5 - 4.5 g/dL   Albumin/Globulin Ratio 1.9 1.2 - 2.2   Bilirubin Total 0.3 0.0 - 1.2 mg/dL   Alkaline Phosphatase 88 44 - 121 IU/L   AST 63 (H) 0 - 40 IU/L   ALT 75 (H) 0 - 44 IU/L  Lipid Profile  Result Value Ref Range   Cholesterol, Total 129 100 - 199 mg/dL   Triglycerides 99 0 - 149 mg/dL   HDL 44 >39 mg/dL   VLDL Cholesterol Cal 19 5 - 40 mg/dL   LDL Chol Calc (NIH) 66 0 - 99 mg/dL   Chol/HDL Ratio 2.9 0.0 - 5.0 ratio  CBC w/Diff  Result Value Ref Range   WBC 7.7 3.4 - 10.8 x10E3/uL   RBC 4.79 4.14 - 5.80 x10E6/uL   Hemoglobin 14.0 13.0 - 17.7 g/dL   Hematocrit 42.4 37.5 - 51.0 %   MCV 89 79 - 97 fL   MCH 29.2 26.6 - 33.0 pg   MCHC 33.0 31.5 - 35.7 g/dL   RDW 19.3 (H) 11.6 - 15.4 %   Platelets 246 150 - 450 x10E3/uL   Neutrophils 62 Not Estab. %   Lymphs 19 Not Estab. %   Monocytes 12 Not Estab. %   Eos 5 Not Estab. %   Basos 1 Not Estab. %   Neutrophils Absolute 4.7 1.4 - 7.0 x10E3/uL   Lymphocytes Absolute 1.5 0.7 - 3.1 x10E3/uL   Monocytes Absolute 0.9 0.1 - 0.9 x10E3/uL   EOS (ABSOLUTE) 0.4 0.0 - 0.4 x10E3/uL   Basophils Absolute 0.1 0.0 - 0.2 x10E3/uL   Immature Granulocytes 1 Not Estab. %   Immature Grans (Abs) 0.1 0.0 - 0.1 x10E3/uL      Assessment & Plan:   Problem List Items Addressed This Visit       Cardiovascular and Mediastinum   Hypertension    Chronic.  Controlled.  Elevated on first check and improved on second.  Continue with current medication regimen on Metoprolol 27m BID.  Does not check blood pressures at home.  Labs ordered today.  Return to clinic in 3 months for reevaluation.  Call sooner if concerns  arise.        Relevant Orders   Comp Met (CMET)   AF  (paroxysmal atrial fibrillation) (HCC)    Chronic, ongoing with rate-controlled at this time.  Continue current medication regimen and adjust as needed.  Patient declines going to Cardiology.  Patient has stopped Eliquis due to hematuria.  Will need to restart Eliquis at next visit.  Labs today.  Follow up in 3 months.  Call sooner if concerns arise.       HFrEF (heart failure with reduced ejection fraction) (HCC)    Chronic, ongoing with rate-controlled at this time.  No longer on Eliquis due to Hematuria.  Continue current medication regimen and adjust as needed.  Patient declines going to Cardiology.  Will reassess at future visits. Labs today.  Follow up in 3 months.  Call sooner if concerns arise.       Senile purpura ([place]) - Primary    Reassured patient.  Will monitor in the future.       Aortic atherosclerosis (HCC)    Chronic. Controlled.  Continue with current medication regimen of Simvastatin 34m daily. Labs ordered today.  Follow up in 3 months.  Call sooner if concerns arise.         Genitourinary   CKD (chronic kidney disease), stage III (HCC)    Chronic. Labs ordered today.  Will make recommendations based on lab results.  Patient declines referral to Nephrology. Will reassess at future visits.  Continue current medication regimen.       Relevant Orders   CBC w/Diff     Other   Hyperlipidemia    Chronic.  Controlled.  Continue with current medication regimen on Simvastatin 229m  Labs ordered today.  Return to clinic in 3 months for reevaluation.  Call sooner if concerns arise.        Relevant Orders   Lipid Profile   Anxiety and depression    Chronic.  Controlled without medication.  Does not feel like he needs medication at this time.  Labs ordered today.  Return to clinic in 3 months for reevaluation.  Call sooner if concerns arise.          Follow up plan: Return in about 3 months (around 03/23/2022) for HTN, HLD, DM2 FU.

## 2021-12-21 ENCOUNTER — Ambulatory Visit (INDEPENDENT_AMBULATORY_CARE_PROVIDER_SITE_OTHER): Payer: Medicare Other | Admitting: Nurse Practitioner

## 2021-12-21 ENCOUNTER — Other Ambulatory Visit: Payer: Self-pay | Admitting: Nurse Practitioner

## 2021-12-21 ENCOUNTER — Encounter: Payer: Self-pay | Admitting: Nurse Practitioner

## 2021-12-21 VITALS — BP 130/72 | HR 40 | Temp 97.8°F | Wt 159.0 lb

## 2021-12-21 DIAGNOSIS — D692 Other nonthrombocytopenic purpura: Secondary | ICD-10-CM

## 2021-12-21 DIAGNOSIS — I10 Essential (primary) hypertension: Secondary | ICD-10-CM | POA: Diagnosis not present

## 2021-12-21 DIAGNOSIS — E782 Mixed hyperlipidemia: Secondary | ICD-10-CM

## 2021-12-21 DIAGNOSIS — I700 Atherosclerosis of aorta: Secondary | ICD-10-CM | POA: Diagnosis not present

## 2021-12-21 DIAGNOSIS — I5020 Unspecified systolic (congestive) heart failure: Secondary | ICD-10-CM | POA: Diagnosis not present

## 2021-12-21 DIAGNOSIS — N1832 Chronic kidney disease, stage 3b: Secondary | ICD-10-CM

## 2021-12-21 DIAGNOSIS — I480 Paroxysmal atrial fibrillation: Secondary | ICD-10-CM

## 2021-12-21 DIAGNOSIS — F32A Depression, unspecified: Secondary | ICD-10-CM | POA: Diagnosis not present

## 2021-12-21 DIAGNOSIS — F419 Anxiety disorder, unspecified: Secondary | ICD-10-CM

## 2021-12-21 MED ORDER — IRON (FERROUS SULFATE) 325 (65 FE) MG PO TABS: 325.0000 mg | ORAL_TABLET | Freq: Every day | ORAL | 1 refills | Status: DC

## 2021-12-21 NOTE — Assessment & Plan Note (Signed)
Chronic.  Controlled.  Continue with current medication regimen on Simvastatin 20mg.  Labs ordered today.  Return to clinic in 3 months for reevaluation.  Call sooner if concerns arise.   

## 2021-12-21 NOTE — Assessment & Plan Note (Signed)
Chronic. Labs ordered today.  Will make recommendations based on lab results.  Patient declines referral to Nephrology. Will reassess at future visits.  Continue current medication regimen.  

## 2021-12-21 NOTE — Assessment & Plan Note (Signed)
Chronic.  Controlled without medication.  Does not feel like he needs medication at this time.  Labs ordered today.  Return to clinic in 3 months for reevaluation.  Call sooner if concerns arise.

## 2021-12-21 NOTE — Assessment & Plan Note (Signed)
Chronic. Controlled.  Continue with current medication regimen of Simvastatin 20mg daily. Labs ordered today.  Follow up in 3 months.  Call sooner if concerns arise.  

## 2021-12-21 NOTE — Telephone Encounter (Signed)
Medication Refill - Medication: Iron, Ferrous Sulfate, 325 (65 Fe) MG TABS  Zinc 220 (50 Zn) MG CAPS  Has the patient contacted their pharmacy? Yes.   (Agent: If no, request that the patient contact the pharmacy for the refill. If patient does not wish to contact the pharmacy document the reason why and proceed with request.) (Agent: If yes, when and what did the pharmacy advise?) No refills / call pcp  Preferred Pharmacy (with phone number or street name):  [id number] - [place], [place] - [address] Phone:  [phone]  Fax:  [phone]     Has the patient been seen for an appointment in the last year OR does the patient have an upcoming appointment? Yes.    Agent: Please be advised that RX refills may take up to 3 business days. We ask that you follow-up with your pharmacy.

## 2021-12-21 NOTE — Assessment & Plan Note (Signed)
Chronic, ongoing with rate-controlled at this time.  Continue current medication regimen and adjust as needed.  Patient declines going to Cardiology.  Patient has stopped Eliquis due to hematuria.  Will need to restart Eliquis at next visit.  Labs today.  Follow up in 3 months.  Call sooner if concerns arise.

## 2021-12-21 NOTE — Assessment & Plan Note (Signed)
Reassured patient.  Will monitor in the future.  

## 2021-12-21 NOTE — Telephone Encounter (Signed)
Called to clarify if pt receiving med  from mail order. Family states he is not. [facility]

## 2021-12-21 NOTE — Assessment & Plan Note (Signed)
Chronic, ongoing with rate-controlled at this time.  No longer on Eliquis due to Hematuria.  Continue current medication regimen and adjust as needed.  Patient declines going to Cardiology.  Will reassess at future visits. Labs today.  Follow up in 3 months.  Call sooner if concerns arise.

## 2021-12-21 NOTE — Assessment & Plan Note (Signed)
Chronic.  Controlled.  Elevated on first check and improved on second.  Continue with current medication regimen on Metoprolol 25mg  BID.  Does not check blood pressures at home.  Labs ordered today.  Return to clinic in 3 months for reevaluation.  Call sooner if concerns arise.

## 2021-12-21 NOTE — Telephone Encounter (Signed)
Requested Prescriptions  Pending Prescriptions Disp Refills  . Iron, Ferrous Sulfate, 325 (65 Fe) MG TABS 90 tablet 1    Sig: Take 325 mg by mouth daily.     Endocrinology:  Minerals - Iron Supplementation Failed - 12/21/2021  4:29 PM      Failed - Fe (serum) in normal range and within 360 days    No results found for: "IRON", "IRONPCTSAT"       Failed - Ferritin in normal range and within 360 days    Ferritin  Date Value Ref Range Status  02/15/2020 161 24 - 336 ng/mL Final    Comment:    Performed at [facility], [address] [place], [place] [address]         Passed - HGB in normal range and within 360 days    Hemoglobin  Date Value Ref Range Status  09/05/2021 14.0 13.0 - 17.7 g/dL Final         Passed - HCT in normal range and within 360 days    Hematocrit  Date Value Ref Range Status  09/05/2021 42.4 37.5 - 51.0 % Final         Passed - RBC in normal range and within 360 days    RBC  Date Value Ref Range Status  09/05/2021 4.79 4.14 - 5.80 x10E6/uL Final  07/17/2020 3.87 (L) 4.22 - 5.81 MIL/uL Final         Passed - Valid encounter within last 12 months    Recent Outpatient Visits          Today Senile purpura (HCC)   [facility] Larae Grooms, NP   3 months ago Primary hypertension   [facility] Larae Grooms, NP   6 months ago Hematuria, unspecified type   [facility] Larae Grooms, NP   8 months ago Hematuria, unspecified type   [facility] Larae Grooms, NP   8 months ago Acute cystitis with hematuria   [facility] Larae Grooms, NP      Future Appointments            In 3 months Larae Grooms, NP [facility]   In 4 months  [facility], [facility]

## 2021-12-22 ENCOUNTER — Encounter: Payer: Self-pay | Admitting: Family Medicine

## 2021-12-22 LAB — CBC WITH DIFFERENTIAL/PLATELET
Basophils Absolute: 0.1 10*3/uL (ref 0.0–0.2)
Basos: 1 %
EOS (ABSOLUTE): 0.4 10*3/uL (ref 0.0–0.4)
Eos: 5 %
Hematocrit: 41.3 % (ref 37.5–51.0)
Hemoglobin: 13.7 g/dL (ref 13.0–17.7)
Immature Grans (Abs): 0.1 10*3/uL (ref 0.0–0.1)
Immature Granulocytes: 1 %
Lymphocytes Absolute: 1.9 10*3/uL (ref 0.7–3.1)
Lymphs: 22 %
MCH: 31.1 pg (ref 26.6–33.0)
MCHC: 33.2 g/dL (ref 31.5–35.7)
MCV: 94 fL (ref 79–97)
Monocytes Absolute: 1.1 10*3/uL — ABNORMAL HIGH (ref 0.1–0.9)
Monocytes: 12 %
Neutrophils Absolute: 5.4 10*3/uL (ref 1.4–7.0)
Neutrophils: 59 %
Platelets: 237 10*3/uL (ref 150–450)
RBC: 4.4 x10E6/uL (ref 4.14–5.80)
RDW: 14.2 % (ref 11.6–15.4)
WBC: 8.9 10*3/uL (ref 3.4–10.8)

## 2021-12-22 LAB — LIPID PANEL
Chol/HDL Ratio: 2.2 ratio (ref 0.0–5.0)
Cholesterol, Total: 156 mg/dL (ref 100–199)
HDL: 70 mg/dL (ref >39)
LDL Chol Calc (NIH): 72 mg/dL (ref 0–99)
Triglycerides: 73 mg/dL (ref 0–149)
VLDL Cholesterol Cal: 14 mg/dL (ref 5–40)

## 2021-12-22 LAB — COMPREHENSIVE METABOLIC PANEL
ALT: 42 IU/L (ref 0–44)
AST: 35 IU/L (ref 0–40)
Albumin/Globulin Ratio: 2.3 — ABNORMAL HIGH (ref 1.2–2.2)
Albumin: 4.1 g/dL (ref 3.6–4.6)
Alkaline Phosphatase: 78 IU/L (ref 44–121)
BUN/Creatinine Ratio: 16 (ref 10–24)
BUN: 22 mg/dL (ref 8–27)
Bilirubin Total: 0.3 mg/dL (ref 0.0–1.2)
CO2: 22 mmol/L (ref 20–29)
Calcium: 8.8 mg/dL (ref 8.6–10.2)
Chloride: 104 mmol/L (ref 96–106)
Creatinine, Ser: 1.36 mg/dL — ABNORMAL HIGH (ref 0.76–1.27)
Globulin, Total: 1.8 g/dL (ref 1.5–4.5)
Glucose: 88 mg/dL (ref 70–99)
Potassium: 4.7 mmol/L (ref 3.5–5.2)
Sodium: 139 mmol/L (ref 134–144)
Total Protein: 5.9 g/dL — ABNORMAL LOW (ref 6.0–8.5)
eGFR: 50 mL/min/{1.73_m2} — ABNORMAL LOW (ref >59)

## 2021-12-22 NOTE — Progress Notes (Signed)
Letter printed and mailed.  

## 2021-12-24 ENCOUNTER — Other Ambulatory Visit: Payer: Self-pay | Admitting: Nurse Practitioner

## 2021-12-25 ENCOUNTER — Telehealth: Payer: Self-pay

## 2021-12-25 NOTE — Telephone Encounter (Signed)
Requested medication (s) are due for refill today: No  Requested medication (s) are on the active medication list: Yes  Last refill:  10/26/21  Future visit scheduled: Yes  Notes to clinic:  All meds refilled 10/26/21.    Requested Prescriptions  Pending Prescriptions Disp Refills   simvastatin (ZOCOR) 20 MG tablet [Pharmacy Med Name: Simvastatin 20 MG Oral Tablet] 100 tablet 2    Sig: TAKE 1 TABLET BY MOUTH DAILY AT 6 PM.     Cardiovascular:  Antilipid - Statins Failed - 12/24/2021 11:09 PM      Failed - Lipid Panel in normal range within the last 12 months    Cholesterol, Total  Date Value Ref Range Status  12/21/2021 156 100 - 199 mg/dL Final   LDL Chol Calc (NIH)  Date Value Ref Range Status  12/21/2021 72 0 - 99 mg/dL Final   HDL  Date Value Ref Range Status  12/21/2021 70 >39 mg/dL Final   Triglycerides  Date Value Ref Range Status  12/21/2021 73 0 - 149 mg/dL Final         Passed - Patient is not pregnant      Passed - Valid encounter within last 12 months    Recent Outpatient Visits           4 days ago Senile purpura (HCC)   [facility] Larae Grooms, NP   3 months ago Primary hypertension   [facility], Clydie Braun, NP   6 months ago Hematuria, unspecified type   [facility] Larae Grooms, NP   8 months ago Hematuria, unspecified type   [facility] Larae Grooms, NP   8 months ago Acute cystitis with hematuria   [facility] Larae Grooms, NP       Future Appointments             In 2 months Larae Grooms, NP [facility]   In 4 months  [facility]             amiodarone (PACERONE) 200 MG tablet [employer] Med Name: Amiodarone HCl 200 MG Oral Tablet] 100 tablet 2    Sig: TAKE 1 TABLET BY MOUTH DAILY     Not Delegated - Cardiovascular: Antiarrhythmic Agents - amiodarone Failed - 12/24/2021 11:09 PM      Failed - This  refill cannot be delegated      Failed - Manual Review: Eye exam recommended every 12 months      Failed - TSH in normal range and within 360 days    TSH  Date Value Ref Range Status  05/18/2020 0.781 0.450 - 4.500 uIU/mL Final         Failed - Mg Level in normal range and within 360 days    Magnesium  Date Value Ref Range Status  02/15/2020 2.2 1.7 - 2.4 mg/dL Final    Comment:    Performed at [facility], [address] [place], [place] [address]         Failed - Patient had ECG in the last 180 days      Failed - Last Heart Rate in normal range    Pulse Readings from Last 1 Encounters:  12/21/21 (!) 40         Passed - K in normal range and within 180 days    Potassium  Date Value Ref Range Status  12/21/2021 4.7 3.5 - 5.2 mmol/L Final  Passed - AST in normal range and within 180 days    AST  Date Value Ref Range Status  12/21/2021 35 0 - 40 IU/L Final         Passed - ALT in normal range and within 180 days    ALT  Date Value Ref Range Status  12/21/2021 42 0 - 44 IU/L Final         Passed - Patient is not pregnant      Passed - Last BP in normal range    BP Readings from Last 1 Encounters:  12/21/21 130/72         Passed - Valid encounter within last 6 months    Recent Outpatient Visits           4 days ago Senile purpura (HCC)   [facility] Larae Grooms, NP   3 months ago Primary hypertension   [facility] Larae Grooms, NP   6 months ago Hematuria, unspecified type   [facility] Larae Grooms, NP   8 months ago Hematuria, unspecified type   [facility] Larae Grooms, NP   8 months ago Acute cystitis with hematuria   [facility] Larae Grooms, NP       Future Appointments             In 2 months Larae Grooms, NP [facility]   In 4 months  [facility], [facility]            Passed - Patient had chest  x-ray within the last 6 months       potassium chloride (KLOR-CON) 10 MEQ tablet [Pharmacy Med Name: Potassium Chloride ER 10 MEQ Oral Tablet Extended Release] 200 tablet 2    Sig: TAKE 1 TABLET BY MOUTH TWICE  DAILY     Endocrinology:  Minerals - Potassium Supplementation Failed - 12/24/2021 11:09 PM      Failed - Cr in normal range and within 360 days    Creatinine, Ser  Date Value Ref Range Status  12/21/2021 1.36 (H) 0.76 - 1.27 mg/dL Final         Passed - K in normal range and within 360 days    Potassium  Date Value Ref Range Status  12/21/2021 4.7 3.5 - 5.2 mmol/L Final         Passed - Valid encounter within last 12 months    Recent Outpatient Visits           4 days ago Senile purpura (HCC)   [facility] Larae Grooms, NP   3 months ago Primary hypertension   [facility] Larae Grooms, NP   6 months ago Hematuria, unspecified type   [facility] Larae Grooms, NP   8 months ago Hematuria, unspecified type   [facility] Larae Grooms, NP   8 months ago Acute cystitis with hematuria   [facility] Larae Grooms, NP       Future Appointments             In 2 months Larae Grooms, NP [facility]   In 4 months  [facility]

## 2021-12-25 NOTE — Progress Notes (Signed)
Per clinical pharmacist."Please call patient's DIL [name] in 1 week. We won't know if [facility] is restarted until after 5/31 PCP visit. Please review visit note and if Xarelto or Eliquis was restarted, see info at the top of my note and call [name] to review eligibility and send PAP as appropriate."  I have tried to call [name] patients relative but left a voicemail's with detailed information as well as a call back number.  Rance Muir, RMA Health Concierge

## 2021-12-27 ENCOUNTER — Ambulatory Visit (INDEPENDENT_AMBULATORY_CARE_PROVIDER_SITE_OTHER): Payer: Medicare Other

## 2021-12-27 ENCOUNTER — Telehealth: Payer: Self-pay

## 2021-12-27 ENCOUNTER — Telehealth: Payer: Medicare Other

## 2021-12-27 DIAGNOSIS — I5020 Unspecified systolic (congestive) heart failure: Secondary | ICD-10-CM

## 2021-12-27 DIAGNOSIS — E782 Mixed hyperlipidemia: Secondary | ICD-10-CM

## 2021-12-27 DIAGNOSIS — I480 Paroxysmal atrial fibrillation: Secondary | ICD-10-CM

## 2021-12-27 DIAGNOSIS — I10 Essential (primary) hypertension: Secondary | ICD-10-CM

## 2021-12-27 NOTE — Patient Instructions (Signed)
Visit Information  Thank you for taking time to visit with me today. Please don't hesitate to contact me if I can be of assistance to you before our next scheduled telephone appointment.  Following are the goals we discussed today:  A-fib:  (Status: Goal Met.) Long Term Goal  Goals met and care plan is being closed  Counseled on increased risk of stroke due to Afib and benefits of anticoagulation for stroke prevention           Reviewed importance of adherence to anticoagulant exactly as prescribed. 10-18-2021: The patients daughter in law states that the patient is now taking his Eliquis as directed. Denies any issues with medication compliance. Education and support given. 12-27-2021: Taking medications as directed. Follows up with pcp on a regular basis. Saw recently Advised patient to discuss inability to afford medications or changes in Afib with provider Counseled on bleeding risk associated with AFIB and importance of self-monitoring for signs/symptoms of bleeding Counseled on avoidance of NSAIDs due to increased bleeding risk with anticoagulants Counseled on importance of regular laboratory monitoring as prescribed Counseled on seeking medical attention after a head injury or if there is blood in the urine/stool Afib action plan reviewed Screening for signs and symptoms of depression related to chronic disease state Assessed social determinant of health barriers   Heart Failure Interventions:  (Status: Goal Met.)  Long Term Goal 12-27-2021: Goals met and care plan is being closed     Wt Readings from Last 3 Encounters:  12/21/21 159 lb (72.1 kg)  09/05/21 152 lb 3.2 oz (69 kg)  06/05/21 161 lb 6.4 oz (73.2 kg)    Basic overview and discussion of pathophysiology of Heart Failure reviewed Provided education on low sodium diet. 12-27-2021: The patients daughter in law [name] takes care of the patients food and assist with making sure he is eating well and staying hydrated. Education and  support given Reviewed Heart Failure Action Plan in depth and provided written copy Assessed need for readable accurate scales in home Provided education about placing scale on hard, flat surface Advised patient to weigh each morning after emptying bladder Discussed importance of daily weight and advised patient to weigh and record daily Reviewed role of diuretics in prevention of fluid overload and management of heart failure Discussed the importance of keeping all appointments with provider Provided patient with education about the role of exercise in the management of heart failure Advised patient to discuss changes in heart health, swelling or edema with provider   Hyperlipidemia:  (Status: Goal Met.) Long Term Goal 12-27-2021: Goals met and care plan is being closed       Lab Results  Component Value Date    CHOL 129 09/05/2021    HDL 44 09/05/2021    LDLCALC 66 09/05/2021    TRIG 99 09/05/2021    CHOLHDL 2.9 09/05/2021      Medication review performed; medication list updated in electronic medical record. 12-27-2021: Review of plan of care and goals of care are met. The patient is compliant with medications.  Provider established cholesterol goals reviewed; Counseled on importance of regular laboratory monitoring as prescribed; Provided HLD educational materials; Reviewed role and benefits of statin for ASCVD risk reduction; Discussed strategies to manage statin-induced myalgias; Reviewed importance of limiting foods high in cholesterol;   Hypertension: (Status: Goal Met.) Long Term Goal  12-27-2021: Goals met and care plan is being closed  Last practice recorded BP readings:     BP Readings from Last  3 Encounters:  12/21/21 130/72  09/05/21 132/64  06/05/21 (!) 123/58  Most recent eGFR/CrCl:       Lab Results  Component Value Date    EGFR 50 (L) 12/21/2021    No components found for: CRCL   Evaluation of current treatment plan related to hypertension self management  and patient's adherence to plan as established by provider;   Provided education to patient re: stroke prevention, s/s of heart attack and stroke; Reviewed prescribed diet heart healthy Reviewed medications with patient and discussed importance of compliance;  Discussed plans with patient for ongoing care management follow up and provided patient with direct contact information for care management team; Advised patient, providing education and rationale, to monitor blood pressure daily and record, calling PCP for findings outside established parameters;  Reviewed scheduled/upcoming provider appointments including: 03-23-2022 with the pcp Advised patient to discuss changes in HTN health or heart health  with provider; Provided education on prescribed diet Heart healthy;  Discussed complications of poorly controlled blood pressure such as heart disease, stroke, circulatory complications, vision complications, kidney impairment, sexual dysfunction;    Patient Goals/Self-Care Activities: Take medications as prescribed   Attend all scheduled provider appointments Call pharmacy for medication refills 3-7 days in advance of running out of medications Attend church or other social activities Perform all self care activities independently  Perform IADL's (shopping, preparing meals, housekeeping, managing finances) independently Call provider office for new concerns or questions  Work with the social worker to address care coordination needs and will continue to work with the clinical team to address health care and disease management related needs call the Suicide and Crisis Lifeline: 988 call the [place] National Suicide Prevention Lifeline: (336)847-0509 or TTY: (847) 789-8890 TTY 304-320-2271) to talk to a trained counselor call 1-800-273-TALK (toll free, 24 hour hotline) if experiencing a Mental Health or [place]  call office if I gain more than 2 pounds in one day or 5 pounds in one  week keep legs up while sitting use salt in moderation watch for swelling in feet, ankles and legs every day develop a rescue plan follow rescue plan if symptoms flare-up eat more whole grains, fruits and vegetables, lean meats and healthy fats know when to call the doctorfor changes in HF sx and sx track symptoms and what helps feel better or worse dress right for the weather, hot or cold - check pulse (heart) rate once a day - cut down alcohol use - make a plan to eat healthy - keep all lab appointments - stop my alcohol use - take medicine as prescribed check blood pressure weekly choose a place to take my blood pressure (home, clinic or office, retail store) call doctor for signs and symptoms of high blood pressure develop an action plan for high blood pressure keep all doctor appointments take medications for blood pressure exactly as prescribed report new symptoms to your doctor eat more whole grains, fruits and vegetables, lean meats and healthy fats - call for medicine refill 2 or 3 days before it runs out - take all medications exactly as prescribed - call doctor with any symptoms you believe are related to your medicine - call doctor when you experience any new symptoms - go to all doctor appointments as scheduled - adhere to prescribed diet: heart healthy diet  No further follow up required. The patient has met goals of care and the care plan has been closed.   Please call the care guide team at [phone] if  you need to cancel or reschedule your appointment.   If you are experiencing a Mental Health or [place] or need someone to talk to, please call the Suicide and Crisis Lifeline: 988 call the [place] National Suicide Prevention Lifeline: 762-826-7289 or TTY: (985)611-9623 TTY 681-869-9785) to talk to a trained counselor call 1-800-273-TALK (toll free, 24 hour hotline)   The patient verbalized understanding of instructions, educational materials,  and care plan provided today and DECLINED offer to receive copy of patient instructions, educational materials, and care plan.   Noreene Larsson RN, MSN, [facility] Mobile: [phone]

## 2021-12-27 NOTE — Telephone Encounter (Signed)
  Care Management   Follow Up Note   12/27/2021 Name: Caleb Fleming. MRN: [id number] DOB: 1932-02-02   Referred by: Larae Grooms, NP Reason for referral : Chronic Care Management (RNCM: Follow up for Chronic Disease Management and Care Coordination Needs )   An unsuccessful telephone outreach was attempted today. The patient was referred to the case management team for assistance with care management and care coordination.   Follow Up Plan: A HIPPA compliant phone message was left for the patient providing contact information and requesting a return call.   [name] RN, MSN, CCM Community Care Coordinator [place]  [facility] Mobile: [phone]

## 2021-12-27 NOTE — Chronic Care Management (AMB) (Cosign Needed)
Chronic Care Management   CCM RN Visit Note  12/27/2021 Name: Caleb Fleming. MRN: [id number] DOB: [birth year]  Subjective: Caleb Fleming. is a 88 y.o. year old male who is a primary care patient of Jon Billings, NP. The care management team was consulted for assistance with disease management and care coordination needs.    Engaged with patient by telephone for follow up visit in response to provider referral for case management and/or care coordination services.   Consent to Services:  The patient was given information about Chronic Care Management services, agreed to services, and gave verbal consent prior to initiation of services.  Please see initial visit note for detailed documentation.   Patient agreed to services and verbal consent obtained.   Assessment: Review of patient past medical history, allergies, medications, health status, including review of consultants reports, laboratory and other test data, was performed as part of comprehensive evaluation and provision of chronic care management services.   SDOH (Social Determinants of Health) assessments and interventions performed:    CCM Care Plan  Allergies  Allergen Reactions   Bactrim [Sulfamethoxazole-Trimethoprim] Itching   Codeine     Outpatient Encounter Medications as of 12/27/2021  Medication Sig   acetaminophen (TYLENOL) 325 MG tablet Take 650 mg by mouth every 4 (four) hours as needed.   allopurinol (ZYLOPRIM) 100 MG tablet Take 1 tablet (100 mg total) by mouth daily.   amiodarone (PACERONE) 200 MG tablet TAKE 1 TABLET BY MOUTH DAILY   Iron, Ferrous Sulfate, 325 (65 Fe) MG TABS Take 325 mg by mouth daily.   metoprolol succinate (TOPROL-XL) 25 MG 24 hr tablet Take 1 tablet (25 mg total) by mouth daily.   potassium chloride (KLOR-CON) 10 MEQ tablet TAKE 1 TABLET BY MOUTH TWICE  DAILY   simvastatin (ZOCOR) 20 MG tablet TAKE 1 TABLET BY MOUTH DAILY AT  6 PM.   Zinc 220 (50 Zn) MG CAPS Take 1  capsule by mouth daily.   No facility-administered encounter medications on file as of 12/27/2021.    Patient Active Problem List   Diagnosis Date Noted   Hematuria 04/21/2021   Aortic atherosclerosis ([place]) 10/04/2020   Laceration of scalp 07/21/2020   HFrEF (heart failure with reduced ejection fraction) ([place]) 05/18/2020   CKD (chronic kidney disease), stage III ([place]) 05/18/2020   Senile purpura ([place]) 05/18/2020   AF (paroxysmal atrial fibrillation) ([place]) 02/11/2020   History of COVID-19 02/11/2020   Alcohol abuse 12/25/2018   Benign prostatic hyperplasia 05/26/2015   Gout 05/26/2015   Hyperlipidemia 05/26/2015   Insomnia 05/26/2015   Hypogonadism in male 05/26/2015   Hypertension 05/26/2015   Anxiety and depression 05/26/2015    Conditions to be addressed/monitored:Atrial Fibrillation, CHF, HTN, and HLD  Care Plan : RNCM: General Plan of Care (Adult) for Chronic Disease Management and Care Coordination Needs  Updates made by Vanita Ingles, RN since 12/27/2021 12:00 AM     Problem: RNCM: Development of plan of care for Chronic Disease Management (CHF, Afib, HTN, HLD)   Priority: High     Long-Range Goal: RNCM: Effective Management of plan of care for Chronic Disease Management (CHF, Afib, HTN, HLD)   Start Date: 10/18/2021  Expected End Date: 10/19/2022  Priority: High  Note:   Current Barriers: 12-27-2021: Goals met and care plan is being closed  Knowledge Deficits related to plan of care for management of Atrial Fibrillation, CHF, HTN, and HLD  Care Coordination needs related to Level of care concerns  Chronic Disease Management support and education needs related to Atrial Fibrillation, CHF, HTN, and HLD  RNCM Clinical Goal(s):  Patient will verbalize basic understanding of Atrial Fibrillation, CHF, HTN, and HLD disease process and self health management plan as evidenced by keeping appointments, regular lab work, calling the office for changes and working with the CCM  team to effectively manage health and well being take all medications exactly as prescribed and will call provider for medication related questions as evidenced by compliance with medications and calling for refills before running out of medications    attend all scheduled medical appointments: with pcp and specialist as evidenced by keeping appointments and calling for schedule change needs.         demonstrate improved and ongoing adherence to prescribed treatment plan for Atrial Fibrillation, CHF, HTN, and HLD as evidenced by no acute exacerbations of conditions, stable VS, stable labs, and routine visits with providers demonstrate ongoing self health care management ability effectively manage health and well being as evidenced by working with the CCM team through collaboration with Consulting civil engineer, provider, and care team.   Interventions: 1:1 collaboration with primary care provider regarding development and update of comprehensive plan of care as evidenced by provider attestation and co-signature Inter-disciplinary care team collaboration (see longitudinal plan of care) Evaluation of current treatment plan related to  self management and patient's adherence to plan as established by provider   A-fib:  (Status: Goal Met.) Long Term Goal  Goals met and care plan is being closed  Counseled on increased risk of stroke due to Afib and benefits of anticoagulation for stroke prevention           Reviewed importance of adherence to anticoagulant exactly as prescribed. 10-18-2021: The patients daughter in law states that the patient is now taking his Eliquis as directed. Denies any issues with medication compliance. Education and support given. 12-27-2021: Taking medications as directed. Follows up with pcp on a regular basis. Saw recently Advised patient to discuss inability to afford medications or changes in Afib with provider Counseled on bleeding risk associated with AFIB and importance of  self-monitoring for signs/symptoms of bleeding Counseled on avoidance of NSAIDs due to increased bleeding risk with anticoagulants Counseled on importance of regular laboratory monitoring as prescribed Counseled on seeking medical attention after a head injury or if there is blood in the urine/stool Afib action plan reviewed Screening for signs and symptoms of depression related to chronic disease state Assessed social determinant of health barriers  Heart Failure Interventions:  (Status: Goal Met.)  Long Term Goal 12-27-2021: Goals met and care plan is being closed  Wt Readings from Last 3 Encounters:  12/21/21 159 lb (72.1 kg)  09/05/21 152 lb 3.2 oz (69 kg)  06/05/21 161 lb 6.4 oz (73.2 kg)   Basic overview and discussion of pathophysiology of Heart Failure reviewed Provided education on low sodium diet. 12-27-2021: The patients daughter in law [name] takes care of the patients food and assist with making sure he is eating well and staying hydrated. Education and support given Reviewed Heart Failure Action Plan in depth and provided written copy Assessed need for readable accurate scales in home Provided education about placing scale on hard, flat surface Advised patient to weigh each morning after emptying bladder Discussed importance of daily weight and advised patient to weigh and record daily Reviewed role of diuretics in prevention of fluid overload and management of heart failure Discussed the importance of keeping all appointments with provider  Provided patient with education about the role of exercise in the management of heart failure Advised patient to discuss changes in heart health, swelling or edema with provider  Hyperlipidemia:  (Status: Goal Met.) Long Term Goal 12-27-2021: Goals met and care plan is being closed  Lab Results  Component Value Date   CHOL 129 09/05/2021   HDL 44 09/05/2021   LDLCALC 66 09/05/2021   TRIG 99 09/05/2021   CHOLHDL 2.9 09/05/2021      Medication review performed; medication list updated in electronic medical record. 12-27-2021: Review of plan of care and goals of care are met. The patient is compliant with medications.  Provider established cholesterol goals reviewed; Counseled on importance of regular laboratory monitoring as prescribed; Provided HLD educational materials; Reviewed role and benefits of statin for ASCVD risk reduction; Discussed strategies to manage statin-induced myalgias; Reviewed importance of limiting foods high in cholesterol;  Hypertension: (Status: Goal Met.) Long Term Goal  12-27-2021: Goals met and care plan is being closed  Last practice recorded BP readings:  BP Readings from Last 3 Encounters:  12/21/21 130/72  09/05/21 132/64  06/05/21 (!) 123/58  Most recent eGFR/CrCl:  Lab Results  Component Value Date   EGFR 50 (L) 12/21/2021    No components found for: CRCL  Evaluation of current treatment plan related to hypertension self management and patient's adherence to plan as established by provider;   Provided education to patient re: stroke prevention, s/s of heart attack and stroke; Reviewed prescribed diet heart healthy Reviewed medications with patient and discussed importance of compliance;  Discussed plans with patient for ongoing care management follow up and provided patient with direct contact information for care management team; Advised patient, providing education and rationale, to monitor blood pressure daily and record, calling PCP for findings outside established parameters;  Reviewed scheduled/upcoming provider appointments including: 03-23-2022 with the pcp Advised patient to discuss changes in HTN health or heart health  with provider; Provided education on prescribed diet Heart healthy;  Discussed complications of poorly controlled blood pressure such as heart disease, stroke, circulatory complications, vision complications, kidney impairment, sexual dysfunction;    Patient Goals/Self-Care Activities: Take medications as prescribed   Attend all scheduled provider appointments Call pharmacy for medication refills 3-7 days in advance of running out of medications Attend church or other social activities Perform all self care activities independently  Perform IADL's (shopping, preparing meals, housekeeping, managing finances) independently Call provider office for new concerns or questions  Work with the social worker to address care coordination needs and will continue to work with the clinical team to address health care and disease management related needs call the Suicide and Crisis Lifeline: 988 call the [place] National Suicide Prevention Lifeline: 785-285-9872 or TTY: 616-610-4474 TTY 470-122-7967) to talk to a trained counselor call 1-800-273-TALK (toll free, 24 hour hotline) if experiencing a Mental Health or [place]  call office if I gain more than 2 pounds in one day or 5 pounds in one week keep legs up while sitting use salt in moderation watch for swelling in feet, ankles and legs every day develop a rescue plan follow rescue plan if symptoms flare-up eat more whole grains, fruits and vegetables, lean meats and healthy fats know when to call the doctorfor changes in HF sx and sx track symptoms and what helps feel better or worse dress right for the weather, hot or cold - check pulse (heart) rate once a day - cut down alcohol use - make a  plan to eat healthy - keep all lab appointments - stop my alcohol use - take medicine as prescribed check blood pressure weekly choose a place to take my blood pressure (home, clinic or office, retail store) call doctor for signs and symptoms of high blood pressure develop an action plan for high blood pressure keep all doctor appointments take medications for blood pressure exactly as prescribed report new symptoms to your doctor eat more whole grains, fruits and vegetables,  lean meats and healthy fats - call for medicine refill 2 or 3 days before it runs out - take all medications exactly as prescribed - call doctor with any symptoms you believe are related to your medicine - call doctor when you experience any new symptoms - go to all doctor appointments as scheduled - adhere to prescribed diet: heart healthy diet       Plan:No further follow up required: the patient has met the goals of care and the care plan has been closed. The patients daughter in law has the [facility] number to call for new concerns or educational needs.   Noreene Larsson RN, MSN, [facility] Mobile: [phone]

## 2022-01-05 DIAGNOSIS — I4891 Unspecified atrial fibrillation: Secondary | ICD-10-CM

## 2022-01-05 DIAGNOSIS — I119 Hypertensive heart disease without heart failure: Secondary | ICD-10-CM | POA: Diagnosis not present

## 2022-01-05 DIAGNOSIS — E785 Hyperlipidemia, unspecified: Secondary | ICD-10-CM | POA: Diagnosis not present

## 2022-01-05 DIAGNOSIS — I509 Heart failure, unspecified: Secondary | ICD-10-CM

## 2022-01-15 ENCOUNTER — Telehealth: Payer: Self-pay

## 2022-01-15 NOTE — Chronic Care Management (AMB) (Signed)
Chronic Care Management Pharmacy Assistant   Name: Caleb Fleming.  MRN: [id number] DOB: 14-Dec-1931  Reason for Encounter: Disease State General    Recent office visits:  12/21/21-Caleb Caren Griffins, NP (PCP) General follow up visit. Labs ordered. No longer on Eliquis due to hematuria. Patient declines to go to cardiology. Follow up in 3 months.  Recent consult visits:  None noted  Hospital visits:  None in previous 6 months  Medications: Outpatient Encounter Medications as of 01/15/2022  Medication Sig   acetaminophen (TYLENOL) 325 MG tablet Take 650 mg by mouth every 4 (four) hours as needed.   allopurinol (ZYLOPRIM) 100 MG tablet Take 1 tablet (100 mg total) by mouth daily.   amiodarone (PACERONE) 200 MG tablet TAKE 1 TABLET BY MOUTH DAILY   Iron, Ferrous Sulfate, 325 (65 Fe) MG TABS Take 325 mg by mouth daily.   metoprolol succinate (TOPROL-XL) 25 MG 24 hr tablet Take 1 tablet (25 mg total) by mouth daily.   potassium chloride (KLOR-CON) 10 MEQ tablet TAKE 1 TABLET BY MOUTH TWICE  DAILY   simvastatin (ZOCOR) 20 MG tablet TAKE 1 TABLET BY MOUTH DAILY AT  6 PM.   Zinc 220 (50 Zn) MG CAPS Take 1 capsule by mouth daily.   No facility-administered encounter medications on file as of 01/15/2022.   Contacted Caleb Fleming. for General Review Call   Chart Review:  Have there been any documented new, changed, or discontinued medications since last visit? Yes (If yes, include name, dose, frequency, date) 12/21/21-Caleb Caren Griffins, NP (PCP) No longer on Eliquis due to hematuria.  Has there been any documented recent hospitalizations or ED visits since last visit with Clinical Pharmacist? No   Adherence Review:  Does the Clinical Pharmacist Assistant have access to adherence rates? Yes  Adherence rates for STAR metric medications (List medication(s)/day supply/ last 2 fill dates). See star medicaiton list at the bottom of the note.  Does the patient have >5 day gap  between last estimated fill dates for any of the above medications or other medication gaps? No    Disease State Questions:  Able to connect with Patient? Yes  Did patient have any problems with their health recently? No Note problems and Concerns:Patient states he has no recent health problems and has been doing great.  Have you had any admissions or emergency room visits or worsening of your condition(s) since last visit? No  Have you had any visits with new specialists or providers since your last visit? No Explain:N/a  Have you had any new health care problem(s) since your last visit? No New problem(s) reported:Patient states he has no recent health problems and has been doing great.  Have you run out of any of your medications since you last spoke with clinical pharmacist? No What caused you to run out of your medications?N/a  Are there any medications you are not taking as prescribed? No What kept you from taking your medications as prescribed?N/a  Are you having any issues or side effects with your medications? No Note of issues or side effects: Patient states he has no issues or side effects to any of his medications.  Do you have any other health concerns or questions you want to discuss with your Clinical Pharmacist before your next visit? No  Are there any health concerns that you feel we can do a better job addressing? No  Are you having any problems with any of the following since the last visit: (  select all that apply)  None  Details:  12. Any falls since last visit? No  Details:  13. Any increased or uncontrolled pain since last visit? No  Details:Patient states he has no had any pain.  14. Next visit Type: office       Visit with:Caleb Fleming        Date:03/23/22         Time:9:20am  15. Additional Details? No    Care Gaps: Zoster Vaccines:Never done  Star Rating Drugs: Simvastatin 20 mg Last filled:10/26/21 100 DS  Caleb Fleming,  RMA Health Concierge

## 2022-01-18 ENCOUNTER — Other Ambulatory Visit: Payer: Self-pay | Admitting: Nurse Practitioner

## 2022-01-18 NOTE — Telephone Encounter (Signed)
Requested medication (s) are due for refill today:no  Requested medication (s) are on the active medication list: yes  Last refill:  10/26/21  Future visit scheduled:yes  Notes to clinic:  Unable to refill per protocol, Rx request is too soon. Last refill 10/26/21 for 90 days and 1 refill.     Requested Prescriptions  Pending Prescriptions Disp Refills   metoprolol succinate (TOPROL-XL) 25 MG 24 hr tablet [Pharmacy Med Name: METOPROLOL SUCC ER 25 MG TAB] 90 tablet 1    Sig: Take 1 tablet (25 mg total) by mouth daily.     Cardiovascular:  Beta Blockers Failed - 01/18/2022  3:10 AM      Failed - Last Heart Rate in normal range    Pulse Readings from Last 1 Encounters:  12/21/21 (!) 40         Passed - Last BP in normal range    BP Readings from Last 1 Encounters:  12/21/21 130/72         Passed - Valid encounter within last 6 months    Recent Outpatient Visits           4 weeks ago Senile purpura (HCC)   [facility] Larae Grooms, NP   4 months ago Primary hypertension   [facility] Larae Grooms, NP   7 months ago Hematuria, unspecified type   [facility] Larae Grooms, NP   9 months ago Hematuria, unspecified type   [facility] Larae Grooms, NP   9 months ago Acute cystitis with hematuria   [facility] Larae Grooms, NP       Future Appointments             In 2 months Larae Grooms, NP [facility]   In 3 months  [facility], [facility]

## 2022-02-08 ENCOUNTER — Other Ambulatory Visit: Payer: Self-pay | Admitting: Nurse Practitioner

## 2022-02-09 NOTE — Telephone Encounter (Signed)
Requested medication (s) are due for refill today: no  Requested medication (s) are on the active medication list: yes  Last refill:  10/26/21  Future visit scheduled: yes  Notes to clinic:  Unable to refill per protocol, Rx request is too soon. Last refill 10/26/21 for 90 days and 1 refill.     Requested Prescriptions  Pending Prescriptions Disp Refills   metoprolol succinate (TOPROL-XL) 25 MG 24 hr tablet [Pharmacy Med Name: Metoprolol Succinate ER 25 MG Oral Tablet Extended Release 24 Hour] 100 tablet 2    Sig: TAKE 1 TABLET BY MOUTH DAILY     Cardiovascular:  Beta Blockers Failed - 02/08/2022 10:06 PM      Failed - Last Heart Rate in normal range    Pulse Readings from Last 1 Encounters:  12/21/21 (!) 40         Passed - Last BP in normal range    BP Readings from Last 1 Encounters:  12/21/21 130/72         Passed - Valid encounter within last 6 months    Recent Outpatient Visits           1 month ago Senile purpura (HCC)   [facility] Larae Grooms, NP   5 months ago Primary hypertension   [id number] Larae Grooms, NP   8 months ago Hematuria, unspecified type   [facility] Larae Grooms, NP   9 months ago Hematuria, unspecified type   [facility] Larae Grooms, NP   10 months ago Acute cystitis with hematuria   [facility] Larae Grooms, NP       Future Appointments             In 1 month Larae Grooms, NP [facility], [facility]   In 2 months  [facility], [facility]

## 2022-02-28 IMAGING — CT CT CERVICAL SPINE W/O CM
3 of 4 series · 9 of 33 positions shown, 10 images · non-contrast
Comparison: CT head 02/10/2020

CLINICAL DATA: Unwitnessed fall, laceration, uncontrolled bleeding

EXAM:
CT HEAD WITHOUT CONTRAST
CT CERVICAL SPINE WITHOUT CONTRAST
TECHNIQUE: Multidetector CT imaging of the head and cervical spine was
performed following the standard protocol without intravenous
contrast. Multiplanar CT image reconstructions of the cervical spine
were also generated.

[Series 4: sagittal bone · sagittal · 0.22mm/px · 5 of 70 slices shown]
[im 24/70  bone]
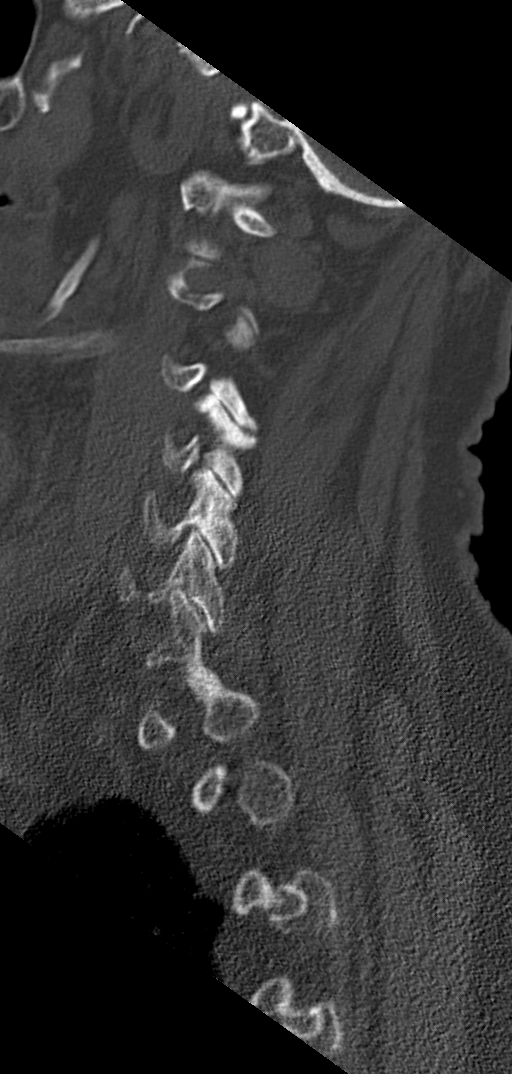
[im 29/70  bone]
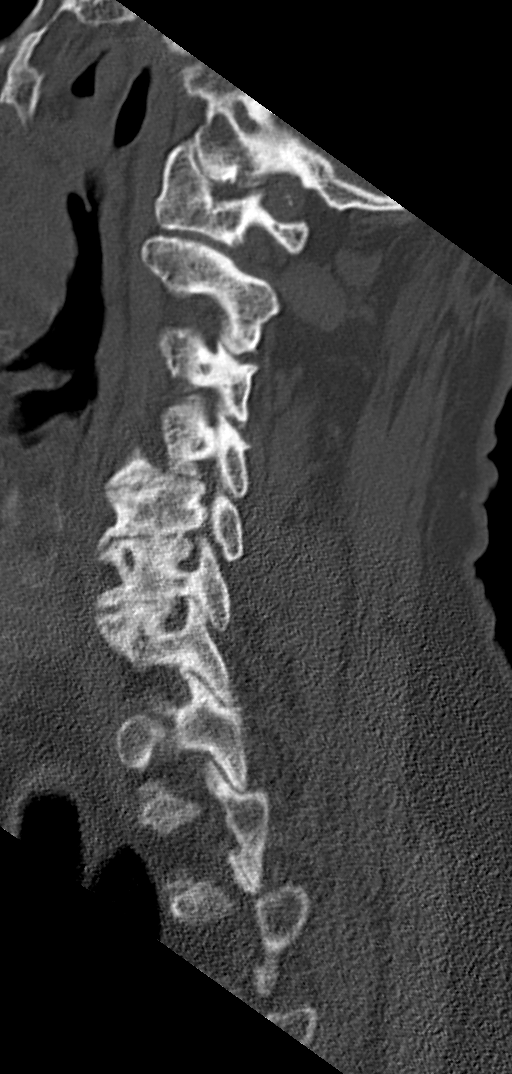
[im 35/70  bone]
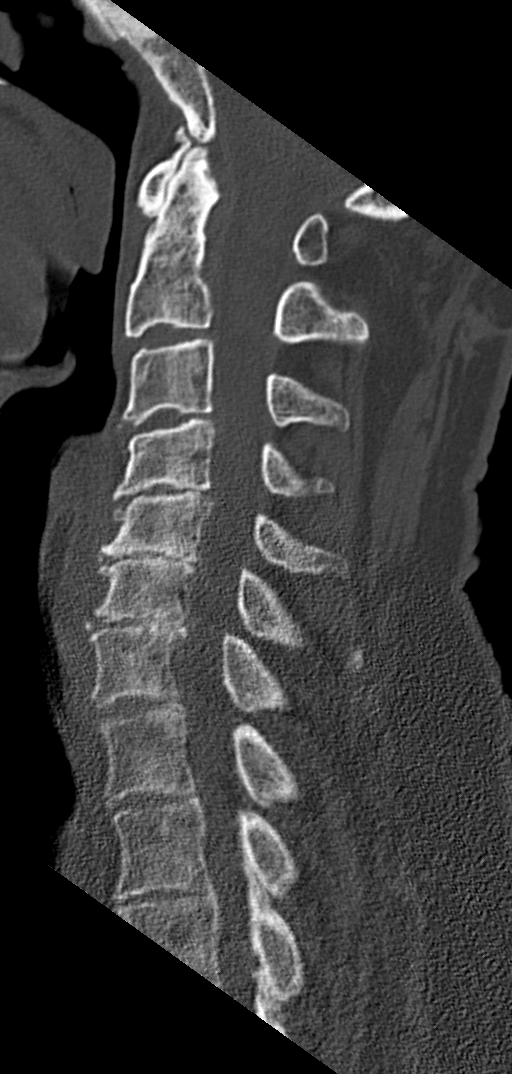
[im 41/70  bone]
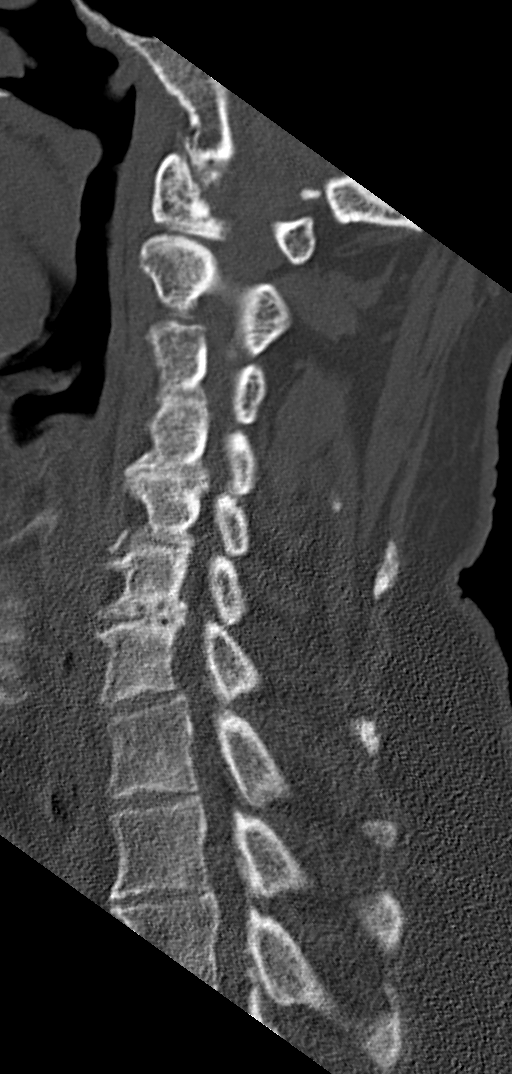
[im 47/70  bone]
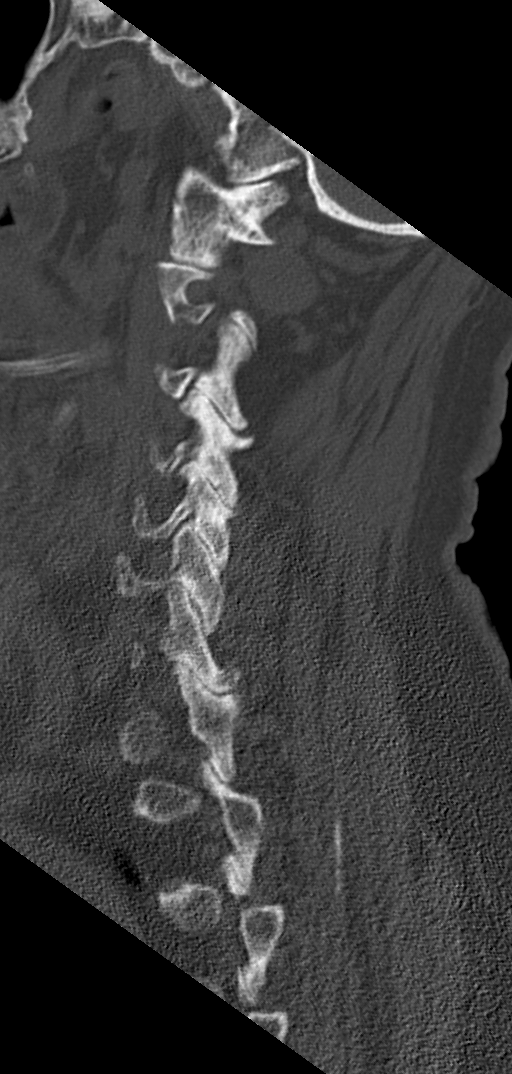

[Series 5: coronal bone · coronal · 0.27mm/px · 3 of 57 slices shown]
[im 16/57  bone]
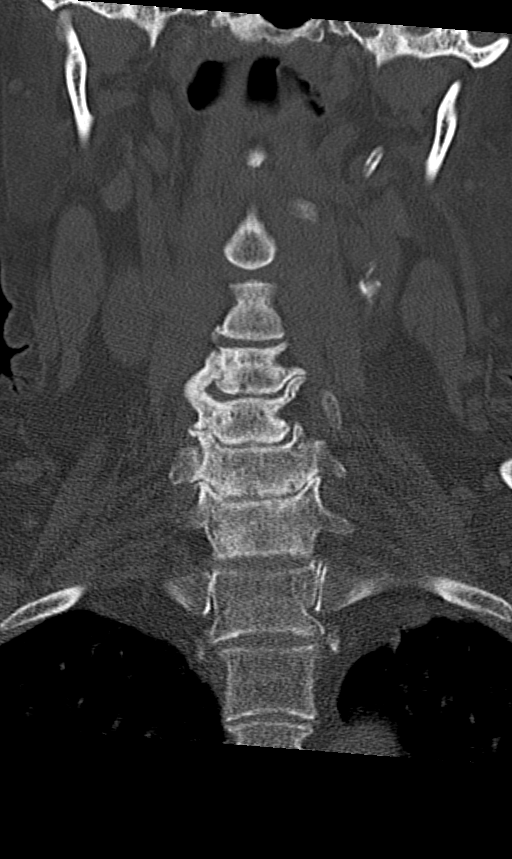
[im 24/57  bone]
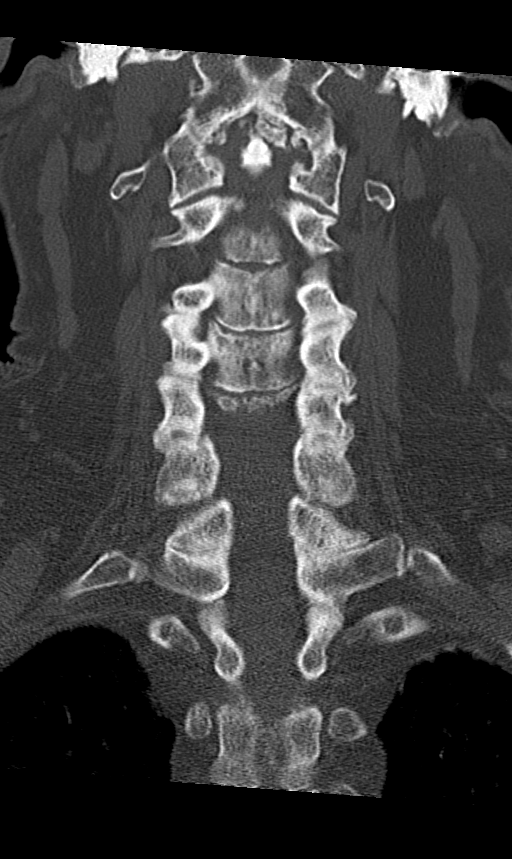
[im 33/57  bone]
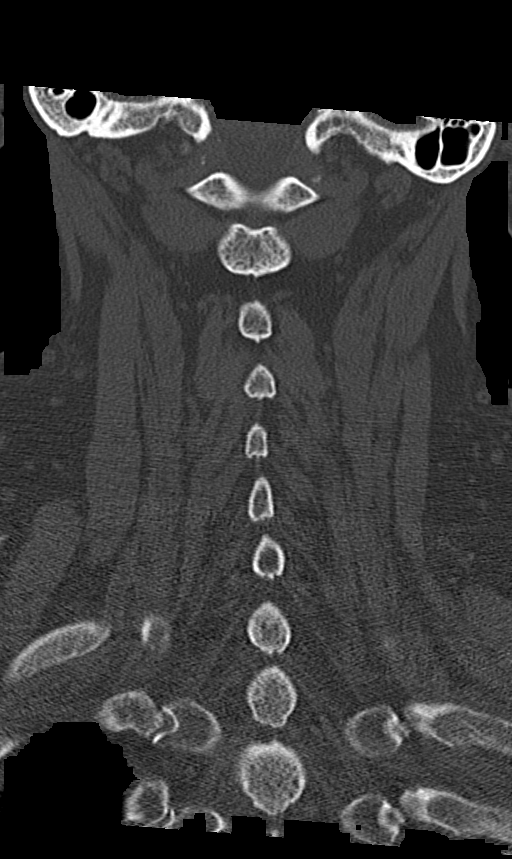

[Series 6: orthogonal bone · axial · 0.22mm/px · z∈[+169,+169]mm · 1 of 118 slices shown, 2 images]
[im 67/118  soft-tissue]
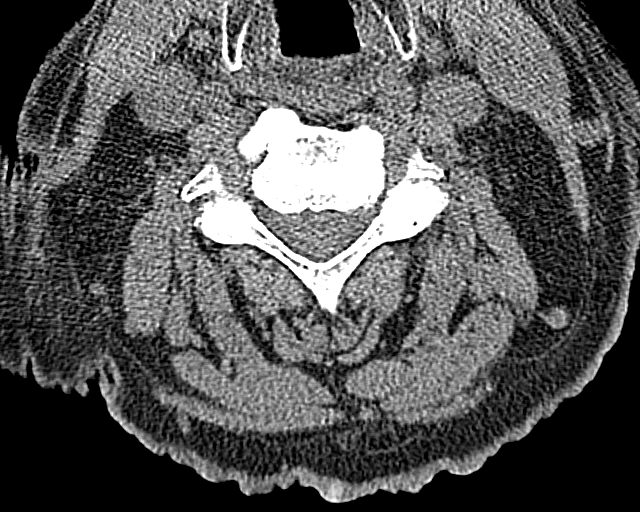
[im 67/118  bone]
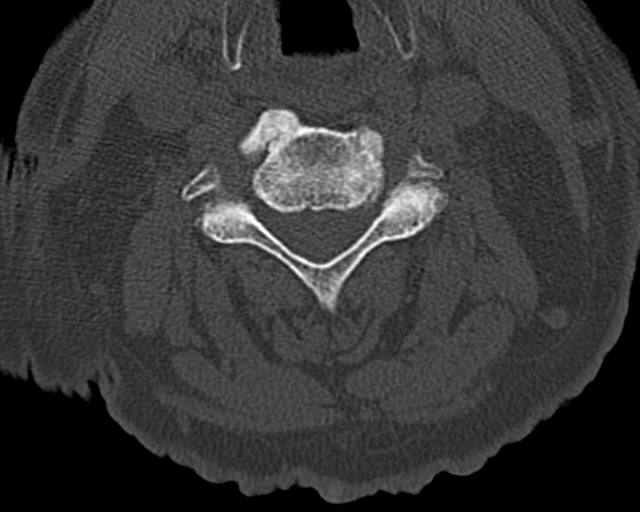

[9 of 33 positions shown; findings below may reference images not displayed]

FINDINGS: CT HEAD FINDINGS

Brain: No evidence of acute infarction, hemorrhage, hydrocephalus,
extra-axial collection, visible mass lesion or mass effect. Stable
diffuse prominence of the ventricles, cisterns and sulci compatible
with frontal predominant parenchymal volume loss. Patchy areas of
white matter hypoattenuation are most compatible with chronic
microvascular angiopathy.

Vascular: Atherosclerotic calcification of the carotid siphons. No
hyperdense vessel.

Skull: Right frontal scalp thickening and swelling with soft tissue
gas and laceration with overlying bandaging material. No subjacent
calvarial fracture. No other significant sites of scalp swelling or
hematoma.

Sinuses/Orbits: Minimal mural thickening in the ethmoids. Remaining
paranasal sinuses and mastoid air cells are predominantly clear.

Other: None.

CT CERVICAL SPINE FINDINGS

Alignment: A cervical stabilization collar is absent at time of
examination. There is slight straightening and reversal of normal
cervical lordosis centered at the C4-5 level. This may be
positional, degenerative or related to muscle spasm. No evidence of
traumatic listhesis. No abnormally widened, perched or jumped
facets. Normal alignment of the craniocervical and atlantoaxial
articulations.

Skull base and vertebrae: No acute skull base fracture. No vertebral
body fracture or height loss. Normal bone mineralization. No
worrisome osseous lesions. Arthrosis at the atlantodental and basion
dens interval with degenerative spurring. Multilevel cervical
spondylitic changes, better detailed below. Enthesopathic
mineralization along the nuchal/supraspinous ligament.

Soft tissues and spinal canal: No pre or paravertebral fluid or
swelling. No visible canal hematoma. Cervical carotid and vertebral
artery atherosclerotic plaque. Airways patent.

Disc levels: Multilevel intervertebral disc height loss with
spondylitic endplate changes. Multilevel posterior disc osteophyte
complex formations are present with some a face mint the ventral
thecal sac and perhaps mild canal stenoses C4-5, C5-6 and C6-7.
Multilevel uncinate spurring facet hypertrophic changes are present
as well resulting in mild-to-moderate multilevel neural foraminal
narrowing with more moderate to severe narrowing bilaterally C5-6
and on the right C6-7.

Upper chest: No acute abnormality in the upper chest or imaged lung
apices.

Other: No concerning thyroid nodules or masses. Additional
calcifications in the proximal great vessels.
IMPRESSION: 1. Right frontal scalp thickening and swelling with soft tissue gas
and laceration with overlying bandaging material. No subjacent
calvarial fracture.
2. No evidence of acute intracranial abnormality.
3. Stable frontal predominant parenchymal volume loss and chronic
microvascular ischemic white matter disease.
4. No evidence of acute fracture or traumatic listhesis of the
cervical spine.
5. Multilevel cervical spondylitic and facet degenerative changes,
as above.
6. Cervical and intracranial atherosclerosis.

## 2022-03-22 NOTE — Progress Notes (Unsigned)
There were no vitals taken for this visit.   Subjective:    Patient ID: Caleb Fleming., male    DOB: Oct 07, 1931, 88 y.o.   MRN: [id number]  HPI: Caleb Fleming. is a 88 y.o. male  No chief complaint on file.  HYPERTENSION / HYPERLIPIDEMIA Satisfied with current treatment? yes Duration of hypertension: years BP monitoring frequency: not checking BP range:  BP medication side effects: no Past BP meds:  Metoprolol Duration of hyperlipidemia: years Cholesterol medication side effects: no Cholesterol supplements: none Past cholesterol medications: simvastatin (zocor) Medication compliance: excellent compliance Aspirin: no Recent stressors: no Recurrent headaches: no Visual changes: no Palpitations: no Dyspnea: no Chest pain: no Lower extremity edema: no Dizzy/lightheaded: no   AFIB Rate Stable.  Denies concerns.  Does not see Cardiology. Not currently on Eliquis due to hematuria.  ANXIETY AND DEPRESSION Patient denies SI/HI in office today. Denies feeling down or anxious.  Feels like everyday is a blessing.  He appreciates getting out of bed everyday.    Relevant past medical, surgical, family and social history reviewed and updated as indicated. Interim medical history since our last visit reviewed. Allergies and medications reviewed and updated.  Review of Systems  Eyes:  Negative for visual disturbance.  Respiratory:  Negative for cough and shortness of breath.   Cardiovascular:  Negative for chest pain and leg swelling.  Neurological:  Negative for light-headedness and headaches.  Psychiatric/Behavioral:  Negative for dysphoric mood and suicidal ideas. The patient is not nervous/anxious.     Per HPI unless specifically indicated above     Objective:    There were no vitals taken for this visit.  Wt Readings from Last 3 Encounters:  12/21/21 159 lb (72.1 kg)  09/05/21 152 lb 3.2 oz (69 kg)  06/05/21 161 lb 6.4 oz (73.2 kg)    Physical  Exam Vitals and nursing note reviewed.  Constitutional:      General: He is not in acute distress.    Appearance: Normal appearance. He is not ill-appearing, toxic-appearing or diaphoretic.  HENT:     Head: Normocephalic.     Right Ear: External ear normal.     Left Ear: External ear normal.     Nose: Nose normal. No congestion or rhinorrhea.     Mouth/Throat:     Mouth: Mucous membranes are moist.  Eyes:     General:        Right eye: No discharge.        Left eye: No discharge.     Extraocular Movements: Extraocular movements intact.     Conjunctiva/sclera: Conjunctivae normal.     Pupils: Pupils are equal, round, and reactive to light.  Cardiovascular:     Rate and Rhythm: Normal rate and regular rhythm.     Heart sounds: No murmur heard. Pulmonary:     Effort: Pulmonary effort is normal. No respiratory distress.     Breath sounds: Normal breath sounds. No wheezing, rhonchi or rales.  Abdominal:     General: Abdomen is flat. Bowel sounds are normal.  Musculoskeletal:     Cervical back: Normal range of motion and neck supple.  Skin:    General: Skin is warm and dry.     Capillary Refill: Capillary refill takes less than 2 seconds.  Neurological:     General: No focal deficit present.     Mental Status: He is alert and oriented to person, place, and time.  Psychiatric:  Mood and Affect: Mood normal.        Behavior: Behavior normal.        Thought Content: Thought content normal.        Judgment: Judgment normal.    Results for orders placed or performed in visit on 12/21/21  Comp Met (CMET)  Result Value Ref Range   Glucose 88 70 - 99 mg/dL   BUN 22 8 - 27 mg/dL   Creatinine, Ser 1.36 (H) 0.76 - 1.27 mg/dL   eGFR 50 (L) >59 mL/min/1.73   BUN/Creatinine Ratio 16 10 - 24   Sodium 139 134 - 144 mmol/L   Potassium 4.7 3.5 - 5.2 mmol/L   Chloride 104 96 - 106 mmol/L   CO2 22 20 - 29 mmol/L   Calcium 8.8 8.6 - 10.2 mg/dL   Total Protein 5.9 (L) 6.0 - 8.5 g/dL    Albumin 4.1 3.6 - 4.6 g/dL   Globulin, Total 1.8 1.5 - 4.5 g/dL   Albumin/Globulin Ratio 2.3 (H) 1.2 - 2.2   Bilirubin Total 0.3 0.0 - 1.2 mg/dL   Alkaline Phosphatase 78 44 - 121 IU/L   AST 35 0 - 40 IU/L   ALT 42 0 - 44 IU/L  CBC w/Diff  Result Value Ref Range   WBC 8.9 3.4 - 10.8 x10E3/uL   RBC 4.40 4.14 - 5.80 x10E6/uL   Hemoglobin 13.7 13.0 - 17.7 g/dL   Hematocrit 41.3 37.5 - 51.0 %   MCV 94 79 - 97 fL   MCH 31.1 26.6 - 33.0 pg   MCHC 33.2 31.5 - 35.7 g/dL   RDW 14.2 11.6 - 15.4 %   Platelets 237 150 - 450 x10E3/uL   Neutrophils 59 Not Estab. %   Lymphs 22 Not Estab. %   Monocytes 12 Not Estab. %   Eos 5 Not Estab. %   Basos 1 Not Estab. %   Neutrophils Absolute 5.4 1.4 - 7.0 x10E3/uL   Lymphocytes Absolute 1.9 0.7 - 3.1 x10E3/uL   Monocytes Absolute 1.1 (H) 0.1 - 0.9 x10E3/uL   EOS (ABSOLUTE) 0.4 0.0 - 0.4 x10E3/uL   Basophils Absolute 0.1 0.0 - 0.2 x10E3/uL   Immature Granulocytes 1 Not Estab. %   Immature Grans (Abs) 0.1 0.0 - 0.1 x10E3/uL  Lipid Profile  Result Value Ref Range   Cholesterol, Total 156 100 - 199 mg/dL   Triglycerides 73 0 - 149 mg/dL   HDL 70 >39 mg/dL   VLDL Cholesterol Cal 14 5 - 40 mg/dL   LDL Chol Calc (NIH) 72 0 - 99 mg/dL   Chol/HDL Ratio 2.2 0.0 - 5.0 ratio      Assessment & Plan:   Problem List Items Addressed This Visit      Cardiovascular and Mediastinum   Hypertension   AF (paroxysmal atrial fibrillation) (HCC)   HFrEF (heart failure with reduced ejection fraction) (HCC)   Senile purpura (HCC) - Primary   Aortic atherosclerosis (HCC)     Genitourinary   CKD (chronic kidney disease), stage III ([place])     Other   Hyperlipidemia     Follow up plan: No follow-ups on file.

## 2022-03-23 ENCOUNTER — Ambulatory Visit (INDEPENDENT_AMBULATORY_CARE_PROVIDER_SITE_OTHER): Payer: Medicare Other | Admitting: Nurse Practitioner

## 2022-03-23 ENCOUNTER — Encounter: Payer: Self-pay | Admitting: Nurse Practitioner

## 2022-03-23 VITALS — BP 140/82 | HR 45 | Temp 97.7°F | Wt 163.7 lb

## 2022-03-23 DIAGNOSIS — I5020 Unspecified systolic (congestive) heart failure: Secondary | ICD-10-CM

## 2022-03-23 DIAGNOSIS — I10 Essential (primary) hypertension: Secondary | ICD-10-CM

## 2022-03-23 DIAGNOSIS — I480 Paroxysmal atrial fibrillation: Secondary | ICD-10-CM

## 2022-03-23 DIAGNOSIS — I700 Atherosclerosis of aorta: Secondary | ICD-10-CM | POA: Diagnosis not present

## 2022-03-23 DIAGNOSIS — N1832 Chronic kidney disease, stage 3b: Secondary | ICD-10-CM | POA: Diagnosis not present

## 2022-03-23 DIAGNOSIS — E782 Mixed hyperlipidemia: Secondary | ICD-10-CM

## 2022-03-23 DIAGNOSIS — D692 Other nonthrombocytopenic purpura: Secondary | ICD-10-CM | POA: Diagnosis not present

## 2022-03-23 NOTE — Assessment & Plan Note (Signed)
Reassured patient.  Will monitor in the future.  

## 2022-03-23 NOTE — Assessment & Plan Note (Signed)
Chronic.  Controlled.  Continue with current medication regimen on Simvastatin 20mg.  Labs ordered today.  Return to clinic in 3 months for reevaluation.  Call sooner if concerns arise.   

## 2022-03-23 NOTE — Assessment & Plan Note (Signed)
Chronic.  Controlled.  Elevated on first check and improved on second.  Continue with current medication regimen on Metoprolol 25mg  BID.  Does not check blood pressures at home.  Labs ordered today.  Return to clinic in 3 months for reevaluation.  Call sooner if concerns arise.

## 2022-03-23 NOTE — Assessment & Plan Note (Signed)
Chronic. Labs ordered today.  Will make recommendations based on lab results.  Patient declines referral to Nephrology. Will reassess at future visits.  Continue current medication regimen.  

## 2022-03-23 NOTE — Assessment & Plan Note (Signed)
Chronic, ongoing with rate-controlled at this time.  HR is low in office today.  Denies symptoms of hypotension.  Continue current medication regimen and adjust as needed.  Patient declines going to Cardiology.  Patient has stopped Eliquis due to hematuria.  Will need to restart Eliquis at next visit.  Labs today.  Follow up in 3 months.  Call sooner if concerns arise.

## 2022-03-23 NOTE — Assessment & Plan Note (Signed)
Chronic. Controlled.  Continue with current medication regimen of Simvastatin 20mg daily. Labs ordered today.  Follow up in 3 months.  Call sooner if concerns arise.  

## 2022-03-23 NOTE — Assessment & Plan Note (Signed)
Chronic, ongoing with rate-controlled at this time.  Continue current medication regimen and adjust as needed.  Patient declines going to Cardiology.  Patient has stopped Eliquis due to hematuria.  Labs today.  Follow up in 3 months.  Call sooner if concerns arise.  

## 2022-03-24 LAB — LIPID PANEL
Chol/HDL Ratio: 2.4 ratio (ref 0.0–5.0)
Cholesterol, Total: 139 mg/dL (ref 100–199)
HDL: 57 mg/dL (ref >39)
LDL Chol Calc (NIH): 68 mg/dL (ref 0–99)
Triglycerides: 72 mg/dL (ref 0–149)
VLDL Cholesterol Cal: 14 mg/dL (ref 5–40)

## 2022-03-24 LAB — COMPREHENSIVE METABOLIC PANEL
ALT: 37 IU/L (ref 0–44)
AST: 33 IU/L (ref 0–40)
Albumin/Globulin Ratio: 1.9 (ref 1.2–2.2)
Albumin: 3.7 g/dL (ref 3.7–4.7)
Alkaline Phosphatase: 96 IU/L (ref 44–121)
BUN/Creatinine Ratio: 19 (ref 10–24)
BUN: 25 mg/dL (ref 8–27)
Bilirubin Total: 0.4 mg/dL (ref 0.0–1.2)
CO2: 23 mmol/L (ref 20–29)
Calcium: 8.4 mg/dL — ABNORMAL LOW (ref 8.6–10.2)
Chloride: 102 mmol/L (ref 96–106)
Creatinine, Ser: 1.3 mg/dL — ABNORMAL HIGH (ref 0.76–1.27)
Globulin, Total: 2 g/dL (ref 1.5–4.5)
Glucose: 82 mg/dL (ref 70–99)
Potassium: 4.3 mmol/L (ref 3.5–5.2)
Sodium: 140 mmol/L (ref 134–144)
Total Protein: 5.7 g/dL — ABNORMAL LOW (ref 6.0–8.5)
eGFR: 53 mL/min/{1.73_m2} — ABNORMAL LOW (ref >59)

## 2022-03-26 NOTE — Progress Notes (Signed)
Please let patient know that his lab work looks good.  No concerns at this time.  Continue with current medication regimen.  Follow up as discussed.

## 2022-04-08 ENCOUNTER — Other Ambulatory Visit: Payer: Self-pay | Admitting: Nurse Practitioner

## 2022-04-09 ENCOUNTER — Ambulatory Visit (INDEPENDENT_AMBULATORY_CARE_PROVIDER_SITE_OTHER): Payer: Medicare Other

## 2022-04-09 NOTE — Telephone Encounter (Signed)
Requested medication (s) are due for refill today: no  Requested medication (s) are on the active medication list: yes  Last refill:  12/25/2021 #100 with 2 RF  Notes to clinic:  This is already with a mail order pharm, also not delegated.      Requested Prescriptions  Pending Prescriptions Disp Refills   amiodarone (PACERONE) 200 MG tablet [Pharmacy Med Name: AMIODARONE HCL 200 MG TABLET] 90 tablet 1    Sig: TAKE 1 TABLET BY MOUTH EVERY DAY     Not Delegated - Cardiovascular: Antiarrhythmic Agents - amiodarone Failed - 04/08/2022  9:11 AM      Failed - This refill cannot be delegated      Failed - Manual Review: Eye exam recommended every 12 months      Failed - TSH in normal range and within 360 days    TSH  Date Value Ref Range Status  05/18/2020 0.781 0.450 - 4.500 uIU/mL Final         Failed - Mg Level in normal range and within 360 days    Magnesium  Date Value Ref Range Status  02/15/2020 2.2 1.7 - 2.4 mg/dL Final    Comment:    Performed at [facility], [address] [place], [place] [address]         Failed - Patient had ECG in the last 180 days      Failed - Last BP in normal range    BP Readings from Last 1 Encounters:  03/23/22 (!) 140/82         Failed - Patient had chest x-ray within the last 6 months      Passed - K in normal range and within 180 days    Potassium  Date Value Ref Range Status  03/23/2022 4.3 3.5 - 5.2 mmol/L Final         Passed - AST in normal range and within 180 days    AST  Date Value Ref Range Status  03/23/2022 33 0 - 40 IU/L Final         Passed - ALT in normal range and within 180 days    ALT  Date Value Ref Range Status  03/23/2022 37 0 - 44 IU/L Final         Passed - Patient is not pregnant      Passed - Last Heart Rate in normal range    Pulse Readings from Last 1 Encounters:  03/23/22 (!) 45         Passed - Valid encounter within last 6 months    Recent Outpatient Visits           2  weeks ago Senile purpura ([place])   [place], Karen, NP   3 months ago Senile purpura ([place])   [facility] Jon Billings, NP   7 months ago Primary hypertension   [facility] Jon Billings, NP   10 months ago Hematuria, unspecified type   [facility] Jon Billings, NP   11 months ago Hematuria, unspecified type   [facility] Jon Billings, NP       Future Appointments             In 2 weeks  [facility] [place]   In 2 months Jon Billings, NP [facility], [place]

## 2022-04-09 NOTE — Patient Instructions (Signed)
Visit Information   Goals Addressed   None    Patient Care Plan: RNCM: Heart Failure (Adult)  Completed 10/18/2021   Problem Identified: RNCM: Symptom Exacerbation (Heart Failure) Resolved 10/18/2021  Priority: Medium     Long-Range Goal: RNCM: Symptom Exacerbation Prevented or Minimized Completed 10/18/2021  Start Date: 10/26/2020  Expected End Date: 10/26/2021  Recent Progress: On track  Priority: Medium  Note:   Current Barriers: Resolving, duplicate goal Knowledge deficits related to basic heart failure pathophysiology and self care management Lacks social connections Does not maintain contact with provider office Does not contact provider office for questions/concerns Lack of scale in home Financial strain EF% 35-40% Nurse Case Manager Clinical Goal(s):  patient will weigh self daily and record patient will verbalize understanding of Heart Failure Action Plan and when to call doctor patient will take all Heart Failure mediations as prescribed Interventions:  Collaboration with Jon Billings, NP regarding development and update of comprehensive plan of care as evidenced by provider attestation and co-signature Inter-disciplinary care team collaboration (see longitudinal plan of care) Basic overview and discussion of pathophysiology of Heart Failure. 02-15-2021: The patient DIL helps manage the patients medications. He is compliant with medications at this time. States he is not having any swelling in his feet or legs. She is monitoring his meals and does not add salt to his foods. She states compliance with a heart healthy diet.  Provided written and verbal education on low sodium diet. 04-19-2021: Education and review. The patient is compliant with a heart healthy diet. The DIL closely monitors his dietary habits.  Reviewed Heart Failure Action Plan in depth and provided written copy Assessed for scales in home- has scales but does not weigh daily Discussed importance of  daily weight Reviewed role of diuretics in prevention of fluid overload Self-Care Activities:  Takes Heart Failure Medications as prescribed Weighs daily and record (notifying MD of 3 lb weight gain over night or 5 lb in a week) Verbalizes understanding of and follows CHF Action Plan Adheres to low sodium diet  Patient Goals:  - Take Heart Failure Medications as prescribed - Weigh daily and record (notify MD with 3 lb weight gain over night or 5 lb in a week) - Follow CHF Action Plan - Adhere to low sodium diet - eat more whole grains, fruits and vegetables, lean meats and healthy fats - follow rescue plan if symptoms flare-up - know when to call the doctor - track symptoms and what helps feel better or worse - dress right for the weather, hot or cold - avoid heavy exercise on very hot days - drink water to stay hydrated during exercise - follow activity or exercise plan - make an activity or exercise plan - pace activity allowing for rest - warm up and cool down for 10 minutes before and after exercise - call office if I gain more than 2 pounds in one day or 5 pounds in one week - keep legs up while sitting - use salt in moderation - watch for swelling in feet, ankles and legs every day - weigh myself daily barriers to lifestyle changes reviewed and addressed - barriers to treatment reviewed and addressed - cognitive screening completed and reviewed - depression screen reviewed - health literacy screening completed or reviewed - healthy lifestyle promoted - medication-adherence assessment completed - rescue (action) plan developed - rescue (action) plan reviewed - self-awareness of signs/symptoms of worsening disease encouraged    Notes: lives with son and daughter in Sports coach  who help manage his care Follow Up Plan: Telephone follow up appointment with care management team member scheduled for: 06-21-2021 at 1145 am    Task: RNCM: Identify and Minimize Risk of Heart Failure  Exacerbation Completed 02/15/2021  Outcome: Positive  Note:   Care Management Activities:    - barriers to lifestyle changes reviewed and addressed - barriers to treatment reviewed and addressed - cognitive screening completed and reviewed - depression screen reviewed - health literacy screening completed or reviewed - healthy lifestyle promoted - medication-adherence assessment completed - rescue (action) plan developed - rescue (action) plan reviewed - self-awareness of signs/symptoms of worsening disease encouraged    Notes: lives with son and daughter in law who help manage his care    Patient Care Plan: RNCM: Hypertension (Adult)  Completed 10/18/2021   Problem Identified: RNCM: Hypertension (Hypertension) Resolved 10/18/2021  Priority: Medium     Long-Range Goal: RNCM: Hypertension Monitored Completed 10/18/2021  Start Date: 10/26/2020  Expected End Date: 10/26/2021  Recent Progress: On track  Priority: Medium  Note:   Objective: Resolving, duplicate goal Last practice recorded BP readings:  BP Readings from Last 3 Encounters:  04/10/21 (!) 144/67  03/01/21 130/78  11/29/20 124/63   Most recent eGFR/CrCl: No results found for: EGFR  No components found for: CRCL Current Barriers:  Knowledge Deficits related to basic understanding of hypertension pathophysiology and self care management Knowledge Deficits related to understanding of medications prescribed for management of hypertension Limited Social Support Unable to independently manage HTN Unable to self administer medications as prescribed Lacks social connections Does not maintain contact with provider office Does not contact provider office for questions/concerns Case Manager Clinical Goal(s):  patient will verbalize understanding of plan for hypertension management patient will attend all scheduled medical appointments: 06-05-2021 at 10:40am patient will demonstrate improved adherence to prescribed treatment  plan for hypertension as evidenced by taking all medications as prescribed, monitoring and recording blood pressure as directed, adhering to low sodium/DASH diet patient will demonstrate improved health management independence as evidenced by checking blood pressure as directed and notifying PCP if SBP>160 or DBP > 90, taking all medications as prescribe, and adhering to a low sodium diet as discussed. patient will verbalize basic understanding of hypertension disease process and self health management plan as evidenced by compliance with medications, compliance with heart healthy diet, and working with CCM team for management of health and well being.  Interventions:  Collaboration with Jon Billings, NP regarding development and update of comprehensive plan of care as evidenced by provider attestation and co-signature Inter-disciplinary care team collaboration (see longitudinal plan of care) Evaluation of current treatment plan related to hypertension self management and patient's adherence to plan as established by provider. 02-15-2021: Spoke to the patients DIL [name], she states the patient is doing well. Only thing new right now is he is having a gout flare up in his foot. She has his medications refilled and reviewed dietary restrictions to follow. Advised the DIL to call the office for unresolved gout flare up. The patient has stable blood pressures. Was asleep at the time of the call. DIL states he has been doing well. Has follow up with pcp on 03-01-2021. 04-19-2021: The patient is recovering from a UTI. The patient will have a follow up UA on 04-24-2021 for evaluation and further recommendations if needed. The patients DIL states he is compliant with his medications and eating well. Did have some elevations of his blood pressure but feels that was due to  UTI and not feeling his usual self. Will continue to monitor.  Provided education to patient re: stroke prevention, s/s of heart attack and stroke,  DASH diet, complications of uncontrolled blood pressure. 04-19-2021: Review of dietary restrictions and staying hydrated. The patient drinks a lot of water. The patient is compliant with a heart healthy diet.  Reviewed medications with patient and discussed importance of compliance. 02-15-2021: Refills picked up today. Denies any issues with medications compliance. 04-19-2021: Is compliant with medications regimen  Discussed plans with patient for ongoing care management follow up and provided patient with direct contact information for care management team Advised patient, providing education and rationale, to monitor blood pressure daily and record, calling PCP for findings outside established parameters.  Reviewed scheduled/upcoming provider appointments including: [phone] at 1040 am Self-Care Activities: - Self administers medications as prescribed Attends all scheduled provider appointments Calls provider office for new concerns, questions, or BP outside discussed parameters Checks BP and records as discussed Follows a low sodium diet/DASH diet Patient Goals: - check blood pressure weekly - choose a place to take my blood pressure (home, clinic or office, retail store) - write blood pressure results in a log or diary - agree on reward when goals are met - agree to work together to make changes - ask questions to understand - have a family meeting to talk about healthy habits - learn about high blood pressure - blood pressure trends reviewed - depression screen reviewed - home or ambulatory blood pressure monitoring encouraged  Follow Up Plan: Telephone follow up appointment with care management team member scheduled for: 06-21-2021 at 11:45 am    Task: RNCM: Identify and Monitor Blood Pressure Elevation Completed 02/15/2021  Outcome: Positive  Note:   Care Management Activities:    - blood pressure trends reviewed - depression screen reviewed - home or ambulatory blood pressure  monitoring encouraged        Patient Care Plan: RNCM: Atrial Fibrillation (Adult)  Completed 10/18/2021   Problem Identified: RNCM: Dysrhythmia (Atrial Fibrillation) Resolved 10/18/2021  Priority: Medium     Long-Range Goal: RNCM: Heart Rate and Rhythm Monitored and Managed/AFIB Completed 10/18/2021  Start Date: 10/26/2020  Expected End Date: 10/28/2021  Recent Progress: On track  Priority: Medium  Note:   Current Barriers: Resolving, duplicate goal Knowledge deficits related to self health management of chronic afib Chronic Disease Management support and education needs related to effective management of AFIB Lacks caregiver support.  Unable to independently manage AFIB Lacks social connections Does not maintain contact with provider office Does not contact provider office for questions/concerns Nurse Case Manager Clinical Goal(s):  patient will take all medications exactly as prescribed and will call provider for medication related questions patient will verbalize understanding of Afib Action Plan and when to call doctor patient will verbalize understanding of plan for effective management of AFIB patient will work with [facility] and pcp to address needs related to AFIB management  patient will attend all scheduled medical appointments: 06-05-2021 at 10:40 am patient will demonstrate improved adherence to prescribed treatment plan for AFIB  as evidenced byrate controlled, compliance with medications, and working with CCM team to optimize health and well being. Interventions:  Collaboration with Jon Billings, NP regarding development and update of comprehensive plan of care as evidenced by provider attestation and co-signature Inter-disciplinary care team collaboration (see longitudinal plan of care) Basic overview and discussion of afib disease state Medications reviewed. 04-19-2021: Compliant with medications. DIL [name] assist with medications management  Afib action  plan  reviewed Evaluation of current treatment plan related to Afib and patient's adherence to plan as established by provider. 04-19-2021: [name] states that the patient is doing well and denies any issue with AFIB exacerbation.  Advised patient to call the office for changes in conditions or questions  Provided education to patient re: sx /sx of uncontrolled heart rate, to monitor for changes in conditions, calling the office for changes in breathing or other issues impacting care of chronic conditions  Reviewed scheduled/upcoming provider appointments including:  Discussed plans with patient for ongoing care management follow up and provided patient with direct contact information for care management team Self-Care Activities: Patient verbalizes understanding of plan to effective management of AFIB Self administers medications as prescribed Attends all scheduled provider appointments Calls pharmacy for medication refills Attends church or other social activities Performs ADL's independently Performs IADL's independently Calls provider office for new concerns or questions Patient Goals: - check pulse (heart) rate once a day - cut down alcohol use - make a plan to eat healthy - keep all lab appointments - stop my alcohol use - take medicine as prescribed - activity or exercise based on tolerance encouraged - barriers to lifestyle changes reviewed and addressed - reassurance provided - rescue (action) plan developed - response to pharmacologic therapy monitored - screen for functional limitations reviewed - rescue (action) plan use encouraged Follow Up Plan: Telephone follow up appointment with care management team member scheduled for: 06-21-2021 at 1145    Task: RNCM: Alleviate Barriers to Dysrhythmia Management Completed 02/15/2021  Outcome: Positive  Note:   Care Management Activities:    - activity or exercise based on tolerance encouraged - barriers to lifestyle changes reviewed and  addressed - reassurance provided - rescue (action) plan developed - response to pharmacologic therapy monitored - screen for functional limitations reviewed - rescue (action) plan use encouraged        Patient Care Plan: RNCM: General Plan of Care (Adult) for Chronic Disease Management and Care Coordination Needs     Problem Identified: RNCM: Development of plan of care for Chronic Disease Management (CHF, Afib, HTN, HLD)   Priority: High     Long-Range Goal: RNCM: Effective Management of plan of care for Chronic Disease Management (CHF, Afib, HTN, HLD)   Start Date: 10/18/2021  Expected End Date: 10/19/2022  Priority: High  Note:   Current Barriers: 12-27-2021: Goals met and care plan is being closed  Knowledge Deficits related to plan of care for management of Atrial Fibrillation, CHF, HTN, and HLD  Care Coordination needs related to Level of care concerns  Chronic Disease Management support and education needs related to Atrial Fibrillation, CHF, HTN, and HLD  RNCM Clinical Goal(s):  Patient will verbalize basic understanding of Atrial Fibrillation, CHF, HTN, and HLD disease process and self health management plan as evidenced by keeping appointments, regular lab work, calling the office for changes and working with the CCM team to effectively manage health and well being take all medications exactly as prescribed and will call provider for medication related questions as evidenced by compliance with medications and calling for refills before running out of medications    attend all scheduled medical appointments: with pcp and specialist as evidenced by keeping appointments and calling for schedule change needs.         demonstrate improved and ongoing adherence to prescribed treatment plan for Atrial Fibrillation, CHF, HTN, and HLD as evidenced by no acute exacerbations of conditions, stable VS, stable labs, and  routine visits with providers demonstrate ongoing self health care  management ability effectively manage health and well being as evidenced by working with the CCM team through collaboration with Consulting civil engineer, provider, and care team.   Interventions: 1:1 collaboration with primary care provider regarding development and update of comprehensive plan of care as evidenced by provider attestation and co-signature Inter-disciplinary care team collaboration (see longitudinal plan of care) Evaluation of current treatment plan related to  self management and patient's adherence to plan as established by provider   A-fib:  (Status: Goal Met.) Long Term Goal  Goals met and care plan is being closed  Counseled on increased risk of stroke due to Afib and benefits of anticoagulation for stroke prevention           Reviewed importance of adherence to anticoagulant exactly as prescribed. 10-18-2021: The patients daughter in law states that the patient is now taking his Eliquis as directed. Denies any issues with medication compliance. Education and support given. 12-27-2021: Taking medications as directed. Follows up with pcp on a regular basis. Saw recently Advised patient to discuss inability to afford medications or changes in Afib with provider Counseled on bleeding risk associated with AFIB and importance of self-monitoring for signs/symptoms of bleeding Counseled on avoidance of NSAIDs due to increased bleeding risk with anticoagulants Counseled on importance of regular laboratory monitoring as prescribed Counseled on seeking medical attention after a head injury or if there is blood in the urine/stool Afib action plan reviewed Screening for signs and symptoms of depression related to chronic disease state Assessed social determinant of health barriers  Heart Failure Interventions:  (Status: Goal Met.)  Long Term Goal 12-27-2021: Goals met and care plan is being closed  Wt Readings from Last 3 Encounters:  12/21/21 159 lb (72.1 kg)  09/05/21 152 lb 3.2 oz (69 kg)   06/05/21 161 lb 6.4 oz (73.2 kg)   Basic overview and discussion of pathophysiology of Heart Failure reviewed Provided education on low sodium diet. 12-27-2021: The patients daughter in law [name] takes care of the patients food and assist with making sure he is eating well and staying hydrated. Education and support given Reviewed Heart Failure Action Plan in depth and provided written copy Assessed need for readable accurate scales in home Provided education about placing scale on hard, flat surface Advised patient to weigh each morning after emptying bladder Discussed importance of daily weight and advised patient to weigh and record daily Reviewed role of diuretics in prevention of fluid overload and management of heart failure Discussed the importance of keeping all appointments with provider Provided patient with education about the role of exercise in the management of heart failure Advised patient to discuss changes in heart health, swelling or edema with provider  Hyperlipidemia:  (Status: Goal Met.) Long Term Goal 12-27-2021: Goals met and care plan is being closed  Lab Results  Component Value Date   CHOL 129 09/05/2021   HDL 44 09/05/2021   LDLCALC 66 09/05/2021   TRIG 99 09/05/2021   CHOLHDL 2.9 09/05/2021     Medication review performed; medication list updated in electronic medical record. 12-27-2021: Review of plan of care and goals of care are met. The patient is compliant with medications.  Provider established cholesterol goals reviewed; Counseled on importance of regular laboratory monitoring as prescribed; Provided HLD educational materials; Reviewed role and benefits of statin for ASCVD risk reduction; Discussed strategies to manage statin-induced myalgias; Reviewed importance of limiting foods high in cholesterol;  Hypertension: (Status: Goal Met.) Long Term Goal  12-27-2021: Goals met and care plan is being closed  Last practice recorded BP readings:  BP Readings  from Last 3 Encounters:  12/21/21 130/72  09/05/21 132/64  06/05/21 (!) 123/58  Most recent eGFR/CrCl:  Lab Results  Component Value Date   EGFR 50 (L) 12/21/2021    No components found for: CRCL  Evaluation of current treatment plan related to hypertension self management and patient's adherence to plan as established by provider;   Provided education to patient re: stroke prevention, s/s of heart attack and stroke; Reviewed prescribed diet heart healthy Reviewed medications with patient and discussed importance of compliance;  Discussed plans with patient for ongoing care management follow up and provided patient with direct contact information for care management team; Advised patient, providing education and rationale, to monitor blood pressure daily and record, calling PCP for findings outside established parameters;  Reviewed scheduled/upcoming provider appointments including: 03-23-2022 with the pcp Advised patient to discuss changes in HTN health or heart health  with provider; Provided education on prescribed diet Heart healthy;  Discussed complications of poorly controlled blood pressure such as heart disease, stroke, circulatory complications, vision complications, kidney impairment, sexual dysfunction;   Patient Goals/Self-Care Activities: Take medications as prescribed   Attend all scheduled provider appointments Call pharmacy for medication refills 3-7 days in advance of running out of medications Attend church or other social activities Perform all self care activities independently  Perform IADL's (shopping, preparing meals, housekeeping, managing finances) independently Call provider office for new concerns or questions  Work with the social worker to address care coordination needs and will continue to work with the clinical team to address health care and disease management related needs call the Suicide and Crisis Lifeline: 988 call the [place] National Suicide  Prevention Lifeline: 540-177-0561 or TTY: 934-472-9549 TTY 213 778 0939) to talk to a trained counselor call 1-800-273-TALK (toll free, 24 hour hotline) if experiencing a Mental Health or [place]  call office if I gain more than 2 pounds in one day or 5 pounds in one week keep legs up while sitting use salt in moderation watch for swelling in feet, ankles and legs every day develop a rescue plan follow rescue plan if symptoms flare-up eat more whole grains, fruits and vegetables, lean meats and healthy fats know when to call the doctorfor changes in HF sx and sx track symptoms and what helps feel better or worse dress right for the weather, hot or cold - check pulse (heart) rate once a day - cut down alcohol use - make a plan to eat healthy - keep all lab appointments - stop my alcohol use - take medicine as prescribed check blood pressure weekly choose a place to take my blood pressure (home, clinic or office, retail store) call doctor for signs and symptoms of high blood pressure develop an action plan for high blood pressure keep all doctor appointments take medications for blood pressure exactly as prescribed report new symptoms to your doctor eat more whole grains, fruits and vegetables, lean meats and healthy fats - call for medicine refill 2 or 3 days before it runs out - take all medications exactly as prescribed - call doctor with any symptoms you believe are related to your medicine - call doctor when you experience any new symptoms - go to all doctor appointments as scheduled - adhere to prescribed diet: heart healthy diet      Patient Care Plan: ccm pharmacy care  plan     Problem Identified: HLD CHF HTN CKD GAD/MDD      Long-Range Goal: disease management   Start Date: 12/07/2022  Recent Progress: On track  Note:   Current Barriers:  Unable to independently afford treatment regimen  Pharmacist Clinical Goal(s):  Patient will verbalize  ability to afford treatment regimen through collaboration with PharmD and provider.   Interventions: 1:1 collaboration with Jon Billings, NP regarding development and update of comprehensive plan of care as evidenced by provider attestation and co-signature Inter-disciplinary care team collaboration (see longitudinal plan of care) Comprehensive medication review performed; medication list updated in electronic medical record  Hyperlipidemia: (LDL goal < 70) -Controlled -Current treatment: Simvastatin 20 mg once daily  Appropriate, Effective, Safe, Accessible -Medications previously tried:   -Current dietary patterns: lots of water, eating eggs, breakfast bowls. Eats most meals without salt.  -Current exercise habits: active outside, fills feeders for birds, squirrel  -Educated on Cholesterol goals;  -Recommended to continue current medication  Atrial Fibrillation (Goal: prevent stroke and major bleeding) -Controlled -Current treatment: Rate control:  Metoprolol Succ 88m Appropriate, Effective, Safe, Accessible Amiodarone 2058mAppropriate, Effective, Safe, Accessible Anticoagulation:  N/A -Medications previously tried: Eliquis (Bloody urine) -Home BP and HR readings: BP <1<829ystolic at home, HR not provided  -Counseled on increased risk of stroke due to Afib and benefits of anticoagulation for stroke prevention; seeking medical attention after a head injury or if there is blood in the urine/stool; -Recommended to continue current medication   Patient Goals/Self-Care Activities Patient will:  - take medications as prescribed as evidenced by patient report and record review  Gout (Goal: Prevent gout flares) -Controlled -Current treatment  Allopurinol 10090mppropriate, Effective, Safe, Accessible -Medications previously tried: N/A  -We discussed:  Counseled patient on low purine diet plan. Counseled patient to reduce consumption of high-fructose corn syrup, sweetened  soft drinks, fruit juices, meat, and seafood. -Recommended to continue current medication   Patient's [facility] pharmacy [facility]:  [id number] [id number]: [phone]: [phone] Constablearm.D. - [phone]      Mr. BradCephas given information about Chronic Care Management services today including:  CCM service includes personalized support from designated clinical staff supervised by his physician, including individualized plan of care and coordination with other care providers 24/7 contact phone numbers for assistance for urgent and routine care needs. Standard insurance, coinsurance, copays and deductibles apply for chronic care management only during months in which we provide at least 20 minutes of these services. Most insurances cover these services at 100%, however patients may be responsible for any copay, coinsurance and/or deductible if applicable. This service may help you avoid the need for more expensive face-to-face services. Only one practitioner may furnish and bill the service in a calendar month. The patient may stop CCM services at any time (effective at the end of the month) by phone call to the office staff.  Patient agreed to services and verbal consent obtained.   The patient verbalized understanding of instructions, educational materials, and care plan provided today and DECLINED offer to receive copy of patient instructions, educational materials, and care plan.  CPP F/U PRN  NathLane HackerH [place]

## 2022-04-09 NOTE — Progress Notes (Signed)
Chronic Care Management Pharmacy Note  04/09/2022 Name:  Caleb Fleming. MRN:  [id number] DOB:  15-Jun-1932  Summary: None  Subjective: Caleb Platt. is an 88 y.o. year old male who is a primary patient of Jon Billings, NP.  The CCM team was consulted for assistance with disease management and care coordination needs.    Engaged with patient by telephone for follow up visit in response to provider referral for pharmacy case management and/or care coordination services.   Consent to Services:  The patient was given information about Chronic Care Management services, agreed to services, and gave verbal consent prior to initiation of services.  Please see initial visit note for detailed documentation.   Patient Care Team: Jon Billings, NP as PCP - General Lane Hacker, [facility] (Pharmacist)  Hospital visits: None in previous 6 months  Objective:  Lab Results  Component Value Date   CREATININE 1.30 (H) 03/23/2022   CREATININE 1.36 (H) 12/21/2021   CREATININE 1.25 09/05/2021    No results found for: "HGBA1C" Last diabetic Eye exam: No results found for: "HMDIABEYEEXA"  Last diabetic Foot exam: No results found for: "HMDIABFOOTEX"      Component Value Date/Time   CHOL 139 03/23/2022 0952   CHOL 156 12/21/2021 0913   CHOL 129 09/05/2021 0842   TRIG 72 03/23/2022 0952   TRIG 73 12/21/2021 0913   TRIG 99 09/05/2021 0842   HDL 57 03/23/2022 0952   HDL 70 12/21/2021 0913   HDL 44 09/05/2021 0842   CHOLHDL 2.4 03/23/2022 0952   LDLCALC 68 03/23/2022 0952   LDLCALC 72 12/21/2021 0913   LDLCALC 66 09/05/2021 0842       Latest Ref Rng & Units 03/23/2022    9:52 AM 12/21/2021    9:13 AM 09/05/2021    8:42 AM  Hepatic Function  Total Protein 6.0 - 8.5 g/dL 5.7  5.9  5.8   Albumin 3.7 - 4.7 g/dL 3.7  4.1  3.8   AST 0 - 40 IU/L 33  35  63   ALT 0 - 44 IU/L 37  42  75   Alk Phosphatase 44 - 121 IU/L 96  78  88   Total Bilirubin 0.0 - 1.2 mg/dL 0.4   0.3  0.3     Lab Results  Component Value Date/Time   TSH 0.781 05/18/2020 11:55 AM   TSH 1.608 12/09/2018 03:21 AM       Latest Ref Rng & Units 12/21/2021    9:13 AM 09/05/2021    8:42 AM 06/05/2021   11:22 AM  CBC  WBC 3.4 - 10.8 x10E3/uL 8.9  7.7  6.9   Hemoglobin 13.0 - 17.7 g/dL 13.7  14.0  9.7   Hematocrit 37.5 - 51.0 % 41.3  42.4  31.9   Platelets 150 - 450 x10E3/uL 237  246  226     No results found for: "VD25OH"  Clinical ASCVD:  The ASCVD Risk score (Arnett DK, et al., 2019) failed to calculate for the following reasons:   The 2019 ASCVD risk score is only valid for ages 42 to 46   Social History   Tobacco Use  Smoking Status Former  Smokeless Tobacco Current   Types: Chew   BP Readings from Last 3 Encounters:  03/23/22 (!) 140/82  12/21/21 130/72  09/05/21 132/64   Pulse Readings from Last 3 Encounters:  03/23/22 (!) 45  12/21/21 (!) 40  09/05/21 (!) 45   Wt Readings  from Last 3 Encounters:  03/23/22 163 lb 11.2 oz (74.3 kg)  12/21/21 159 lb (72.1 kg)  09/05/21 152 lb 3.2 oz (69 kg)   BMI Readings from Last 3 Encounters:  03/23/22 26.34 kg/m  12/21/21 25.59 kg/m  09/05/21 24.49 kg/m    Assessment: Review of patient past medical history, allergies, medications, health status, including review of consultants reports, laboratory and other test data, was performed as part of comprehensive evaluation and provision of chronic care management services.   SDOH:  (Social Determinants of Health) assessments and interventions performed: Yes.  SDOH Interventions    Flowsheet Row Chronic Care Management from 04/09/2022 in [place] [facility] from 12/05/2021 in [place] [facility] from 02/15/2021 in [place] [facility] from 08/28/2019 in [facility] Interventions      Food Insecurity Interventions -- Intervention Not Indicated -- --  Transportation  Interventions Intervention Not Indicated Intervention Not Indicated -- --  Financial Strain Interventions Intervention Not Indicated -- -- --  Physical Activity Interventions -- -- Other (Comments)  [limited mobility, no falls] Other (Comments)  [the patient does not do any structued activity, support and education]  Social Connections Interventions -- -- Other (Comment)  [lives with family, good support system] --      Care Gaps: -none noted  STAR Meds: N/a   CCM Care Plan  Allergies  Allergen Reactions   Bactrim [Sulfamethoxazole-Trimethoprim] Itching   Codeine     Medications Reviewed Today     Reviewed by Lane Hacker, [facility] (Pharmacist) on 04/09/22 at [place] List Status: <None>   Medication Order Taking? Sig Documenting Provider Last Dose Status Informant  acetaminophen (TYLENOL) 325 MG tablet [id number] No Take 650 mg by mouth every 4 (four) hours as needed. [provider] Taking Active Other  allopurinol (ZYLOPRIM) 100 MG tablet [id number] No Take 1 tablet (100 mg total) by mouth daily. Jon Billings, NP Taking Active   amiodarone (PACERONE) 200 MG tablet [id number] No TAKE 1 TABLET BY MOUTH DAILY Cannady, Jolene T, NP Taking Active   Iron, Ferrous Sulfate, 325 (65 Fe) MG TABS [id number] No Take 325 mg by mouth daily. Jon Billings, NP Taking Active   metoprolol succinate (TOPROL-XL) 25 MG 24 hr tablet [id number] No Take 1 tablet (25 mg total) by mouth daily. Jon Billings, NP Taking Active   potassium chloride (KLOR-CON) 10 MEQ tablet [id number] No TAKE 1 TABLET BY MOUTH TWICE  DAILY Cannady, Jolene T, NP Taking Active   simvastatin (ZOCOR) 20 MG tablet [id number] No TAKE 1 TABLET BY MOUTH DAILY AT  6 PM. Cannady, Jolene T, NP Taking Active   Zinc 220 (50 Zn) MG CAPS [id number] No Take 1 capsule by mouth daily. [provider] Taking Active             Patient Active Problem List   Diagnosis Date Noted   Hematuria 04/21/2021   Aortic  atherosclerosis ([place]) 10/04/2020   Laceration of scalp 07/21/2020   HFrEF (heart failure with reduced ejection fraction) ([place]) 05/18/2020   CKD (chronic kidney disease), stage III ([place]) 05/18/2020   Senile purpura ([place]) 05/18/2020   AF (paroxysmal atrial fibrillation) ([place]) 02/11/2020   History of COVID-19 02/11/2020   Alcohol abuse 12/25/2018   Benign prostatic hyperplasia 05/26/2015   Gout 05/26/2015   Hyperlipidemia 05/26/2015   Insomnia 05/26/2015   Hypogonadism in male 05/26/2015   Hypertension 05/26/2015   Anxiety and depression 05/26/2015  Immunization History  Administered Date(s) Administered   Fluad Quad(high Dose 65+) 04/12/2021   Influenza, High Dose Seasonal PF 05/27/2017, 03/24/2018   Influenza-Unspecified 09/16/2019   PFIZER(Purple Top)SARS-COV-2 Vaccination 01/29/2020   Pneumococcal Conjugate-13 09/23/2019   Pneumococcal Polysaccharide-23 03/13/2006, 05/09/2015   Td 03/13/2006   Tdap 05/09/2015, 07/17/2020   Zoster, Live 02/28/2007    Conditions to be addressed/monitored: HLD CHF HTN CKD GAD/MDD  Care Plan : ccm pharmacy care plan  Updates made by Lane Hacker, [place] since 04/09/2022 12:00 AM     Problem: HLD CHF HTN CKD GAD/MDD      Long-Range Goal: disease management   Start Date: 12/07/2022  Recent Progress: On track  Note:   Current Barriers:  Unable to independently afford treatment regimen  Pharmacist Clinical Goal(s):  Patient will verbalize ability to afford treatment regimen through collaboration with PharmD and provider.   Interventions: 1:1 collaboration with Jon Billings, NP regarding development and update of comprehensive plan of care as evidenced by provider attestation and co-signature Inter-disciplinary care team collaboration (see longitudinal plan of care) Comprehensive medication review performed; medication list updated in electronic medical record  Hyperlipidemia: (LDL goal < 70) -Controlled -Current  treatment: Simvastatin 20 mg once daily  Appropriate, Effective, Safe, Accessible -Medications previously tried:   -Current dietary patterns: lots of water, eating eggs, breakfast bowls. Eats most meals without salt.  -Current exercise habits: active outside, fills feeders for birds, squirrel  -Educated on Cholesterol goals;  -Recommended to continue current medication  Atrial Fibrillation (Goal: prevent stroke and major bleeding) -Controlled -Current treatment: Rate control:  Metoprolol Succ 35m Appropriate, Effective, Safe, Accessible Amiodarone 2019mAppropriate, Effective, Safe, Accessible Anticoagulation:  N/A -Medications previously tried: Eliquis (Bloody urine) -Home BP and HR readings: BP <1<450ystolic at home, HR not provided  -Counseled on increased risk of stroke due to Afib and benefits of anticoagulation for stroke prevention; seeking medical attention after a head injury or if there is blood in the urine/stool; -Recommended to continue current medication   Patient Goals/Self-Care Activities Patient will:  - take medications as prescribed as evidenced by patient report and record review  Gout (Goal: Prevent gout flares) -Controlled -Current treatment  Allopurinol 1008mppropriate, Effective, Safe, Accessible -Medications previously tried: N/A  -We discussed:  Counseled patient on low purine diet plan. Counseled patient to reduce consumption of high-fructose corn syrup, sweetened soft drinks, fruit juices, meat, and seafood. -Recommended to continue current medication   Patient's preferred pharmacy is:  [id number] [id number]: [phone]: [phone] Constablearm.D. - [phone]     Pt endorses 100% compliance  Follow Up:  Patient agrees to Care Plan and Follow-up. Plan: HC 1 week pt call (see med assistance). Pharmacist 74m t47m Future Appointments  Date Time  Provider [place]19/2023  8:30 AM CFP NURSEStillwaterCFP PEC  [facility]15/2023 10:00 AM HoldsJon BillingsCFP-CFP PEC    [place] Constablerm.D. - [phone]

## 2022-04-13 ENCOUNTER — Other Ambulatory Visit: Payer: Self-pay | Admitting: Nurse Practitioner

## 2022-04-13 NOTE — Telephone Encounter (Signed)
Unable to refill per protocol, request is too soon. Last refill was 12/25/21 for 200 and 3 RF.E-Prescribing Status: Receipt confirmed by pharmacy (12/25/2021 4:46 PM EDT). Will refuse request.   Requested Prescriptions  Pending Prescriptions Disp Refills  . potassium chloride (KLOR-CON) 10 MEQ tablet [Pharmacy Med Name: POTASSIUM CL ER 10 MEQ TABLET] 180 tablet 1    Sig: TAKE 1 TABLET BY MOUTH TWICE A DAY     Endocrinology:  Minerals - Potassium Supplementation Failed - 04/13/2022  2:25 AM      Failed - Cr in normal range and within 360 days    Creatinine, Ser  Date Value Ref Range Status  03/23/2022 1.30 (H) 0.76 - 1.27 mg/dL Final         Passed - K in normal range and within 360 days    Potassium  Date Value Ref Range Status  03/23/2022 4.3 3.5 - 5.2 mmol/L Final         Passed - Valid encounter within last 12 months    Recent Outpatient Visits          3 weeks ago Senile purpura ([place])   [facility] Jon Billings, NP   3 months ago Senile purpura ([place])   [facility] Jon Billings, NP   7 months ago Primary hypertension   [facility] Jon Billings, NP   10 months ago Hematuria, unspecified type   [facility] Jon Billings, NP   11 months ago Hematuria, unspecified type   [facility] Jon Billings, NP      Future Appointments            In 1 week  [facility], [place]   In 2 months Jon Billings, NP [facility], [place]

## 2022-04-26 ENCOUNTER — Ambulatory Visit: Payer: Medicare Other

## 2022-04-27 ENCOUNTER — Ambulatory Visit: Payer: Medicare Other

## 2022-04-28 ENCOUNTER — Other Ambulatory Visit: Payer: Self-pay | Admitting: Nurse Practitioner

## 2022-05-01 NOTE — Telephone Encounter (Signed)
Requested Prescriptions  Pending Prescriptions Disp Refills  . metoprolol succinate (TOPROL-XL) 25 MG 24 hr tablet [Pharmacy Med Name: Metoprolol Succinate ER 25 MG Oral Tablet Extended Release 24 Hour] 90 tablet 1    Sig: TAKE 1 TABLET BY MOUTH DAILY     Cardiovascular:  Beta Blockers Failed - 04/28/2022 10:19 PM      Failed - Last BP in normal range    BP Readings from Last 1 Encounters:  03/23/22 (!) 140/82         Passed - Last Heart Rate in normal range    Pulse Readings from Last 1 Encounters:  03/23/22 (!) 45         Passed - Valid encounter within last 6 months    Recent Outpatient Visits          1 month ago Senile purpura ([place])   [place], Karen, NP   4 months ago Senile purpura ([place])   [facility] Jon Billings, NP   7 months ago Primary hypertension   [facility] Jon Billings, NP   11 months ago Hematuria, unspecified type   [facility] Jon Billings, NP   1 year ago Hematuria, unspecified type   [facility] Jon Billings, NP      Future Appointments            In 1 month Jon Billings, NP [facility], [place]

## 2022-05-08 DIAGNOSIS — E785 Hyperlipidemia, unspecified: Secondary | ICD-10-CM

## 2022-05-08 DIAGNOSIS — I480 Paroxysmal atrial fibrillation: Secondary | ICD-10-CM

## 2022-05-09 ENCOUNTER — Telehealth: Payer: Self-pay

## 2022-05-09 NOTE — Progress Notes (Signed)
Chronic Care Management Pharmacy Assistant   Name: Caleb Fleming.  MRN: [id number] DOB: 04-Apr-1932   Reason for Encounter: Disease State   Conditions to be addressed/monitored: HTN   Recent office visits:  None since last coordination call  Recent consult visits:  None since the last coordination call  Hospital visits:  None in previous 6 months  Medications: Outpatient Encounter Medications as of 05/09/2022  Medication Sig   amiodarone (PACERONE) 200 MG tablet TAKE 1 TABLET BY MOUTH EVERY DAY   acetaminophen (TYLENOL) 325 MG tablet Take 650 mg by mouth every 4 (four) hours as needed.   allopurinol (ZYLOPRIM) 100 MG tablet Take 1 tablet (100 mg total) by mouth daily.   Iron, Ferrous Sulfate, 325 (65 Fe) MG TABS Take 325 mg by mouth daily.   metoprolol succinate (TOPROL-XL) 25 MG 24 hr tablet TAKE 1 TABLET BY MOUTH DAILY   potassium chloride (KLOR-CON) 10 MEQ tablet TAKE 1 TABLET BY MOUTH TWICE  DAILY   simvastatin (ZOCOR) 20 MG tablet TAKE 1 TABLET BY MOUTH DAILY AT  6 PM.   Zinc 220 (50 Zn) MG CAPS Take 1 capsule by mouth daily.   No facility-administered encounter medications on file as of 05/09/2022.   Reviewed chart prior to disease state call. Spoke with patient regarding BP  Recent Office Vitals: BP Readings from Last 3 Encounters:  03/23/22 (!) 140/82  12/21/21 130/72  09/05/21 132/64   Pulse Readings from Last 3 Encounters:  03/23/22 (!) 45  12/21/21 (!) 40  09/05/21 (!) 45    Wt Readings from Last 3 Encounters:  03/23/22 163 lb 11.2 oz (74.3 kg)  12/21/21 159 lb (72.1 kg)  09/05/21 152 lb 3.2 oz (69 kg)     Kidney Function Lab Results  Component Value Date/Time   CREATININE 1.30 (H) 03/23/2022 09:52 AM   CREATININE 1.36 (H) 12/21/2021 09:13 AM   GFRNONAA 50 (L) 08/31/2020 01:29 PM   GFRNONAA 43 (L) 07/17/2020 01:52 AM   GFRAA 58 (L) 08/31/2020 01:29 PM       Latest Ref Rng & Units 03/23/2022    9:52 AM 12/21/2021    9:13 AM 09/05/2021     8:42 AM  BMP  Glucose 70 - 99 mg/dL 82  88  84   BUN 8 - 27 mg/dL 25  22  15    Creatinine 0.76 - 1.27 mg/dL  6.29  5.28   BUN/Creat Ratio 10 - 24 19  16  12    Sodium 134 - 144 mmol/L 140  139  140   Potassium 3.5 - 5.2 mmol/L 4.3  4.7  3.9   Chloride 96 - 106 mmol/L 102  104  106   CO2 20 - 29 mmol/L 23  22  21    Calcium 8.6 - 10.2 mg/dL 8.4  8.8  8.6     Current antihypertensive regimen:  Metoprolol succinate 25 mg 1 tab daily  How often are you checking your Blood Pressure? Spoke with patient daughter [name] who states that patient is not checking his blood pressure. She states that he is not able and they just keep and eye on him to make sure he is not having any dizzy spells.  Current home BP readings: NA  What recent interventions/DTPs have been made by any provider to improve Blood Pressure control since last CPP Visit: None noted since last coordination call  Any recent hospitalizations or ED visits since last visit with CPP? None in last 6  months  What diet changes have been made to improve Blood Pressure Control?  [name] states that the patient has not made any changes to his diet but that he does add salt to his meals  What exercise is being done to improve your Blood Pressure Control?  [name] states that the patient does not exercise at all  Adherence Review: Is the patient currently on ACE/ARB medication? No Does the patient have >5 day gap between last estimated fill dates? No    Care Gaps: Colonoscopy-NA Diabetic Foot Exam-NA Ophthalmology-NA Dexa Scan - NA Annual Well Visit - NA Micro albumin-NA Hemoglobin A1c- NA  Star Rating Drugs: Simvastatin 20 mg Last filled:03/20/22 90 ds, 01/04/22 100 ds  [facility] [phone]

## 2022-05-24 ENCOUNTER — Other Ambulatory Visit: Payer: Self-pay | Admitting: Nurse Practitioner

## 2022-05-24 NOTE — Telephone Encounter (Signed)
Requested medication (s) are due for refill today:   Yes  Requested medication (s) are on the active medication list:   Yes  Future visit scheduled:   Yes   Last ordered: 10/26/2021 #90, 2 refills  Returned because uric acid due so unable to refill per protocol.   Requested Prescriptions  Pending Prescriptions Disp Refills   allopurinol (ZYLOPRIM) 100 MG tablet [Pharmacy Med Name: Allopurinol 100 MG Oral Tablet] 100 tablet 2    Sig: TAKE 1 TABLET BY MOUTH DAILY     Endocrinology:  Gout Agents - allopurinol Failed - 05/24/2022  6:09 AM      Failed - Uric Acid in normal range and within 360 days    Uric Acid  Date Value Ref Range Status  11/20/2019 10.0 (H) 3.8 - 8.4 mg/dL Final    Comment:               Therapeutic target for gout patients: <6.0         Failed - Cr in normal range and within 360 days    Creatinine, Ser  Date Value Ref Range Status  03/23/2022 1.30 (H) 0.76 - 1.27 mg/dL Final         Passed - Valid encounter within last 12 months    Recent Outpatient Visits           2 months ago Senile purpura (HCC)   [facility] Larae Grooms, NP   5 months ago Senile purpura (HCC)   [facility] Larae Grooms, NP   8 months ago Primary hypertension   [facility] Larae Grooms, NP   11 months ago Hematuria, unspecified type   [facility] Larae Grooms, NP   1 year ago Hematuria, unspecified type   [facility] Larae Grooms, NP       Future Appointments             In 4 weeks Larae Grooms, NP [facility], P[facility]            Passed - CBC within normal limits and completed in the last 12 months    WBC  Date Value Ref Range Status  12/21/2021 8.9 3.4 - 10.8 x10E3/uL Final  07/17/2020 12.9 (H) 4.0 - 10.5 K/uL Final   RBC  Date Value Ref Range Status  12/21/2021 4.40 4.14 - 5.80 x10E6/uL Final  07/17/2020 3.87 (L) 4.22 - 5.81 MIL/uL Final   Hemoglobin  Date  Value Ref Range Status  12/21/2021 13.7 13.0 - 17.7 g/dL Final   Hematocrit  Date Value Ref Range Status  12/21/2021 41.3 37.5 - 51.0 % Final   MCHC  Date Value Ref Range Status  12/21/2021 33.2 31.5 - 35.7 g/dL Final  16/04/9603 54.0 30.0 - 36.0 g/dL Final   [facility]  Date Value Ref Range Status  12/21/2021 31.1 26.6 - 33.0 pg Final  07/17/2020 31.0 26.0 - 34.0 pg Final   MCV  Date Value Ref Range Status  12/21/2021 94 79 - 97 fL Final   No results found for: "PLTCOUNTKUC", "LABPLAT", "POCPLA" RDW  Date Value Ref Range Status  12/21/2021 14.2 11.6 - 15.4 % Final

## 2022-06-12 ENCOUNTER — Other Ambulatory Visit: Payer: Self-pay | Admitting: Nurse Practitioner

## 2022-06-12 NOTE — Telephone Encounter (Signed)
Requested medications are due for refill today.  yes  Requested medications are on the active medications list.  yes  Last refill. 12/21/2021 #90 1 rf  Future visit scheduled.   yes  Notes to clinic.  Missing labs.    Requested Prescriptions  Pending Prescriptions Disp Refills   ferrous sulfate 325 (65 FE) MG tablet [Pharmacy Med Name: FERROUS SULFATE 325 MG TABLET] 90 tablet 1    Sig: TAKE 1 TABLET (325MG ) BY MOUTH EVERY DAY     Endocrinology:  Minerals - Iron Supplementation Failed - 06/12/2022  1:14 AM      Failed - Fe (serum) in normal range and within 360 days    No results found for: "IRON", "IRONPCTSAT"       Failed - Ferritin in normal range and within 360 days    Ferritin  Date Value Ref Range Status  02/15/2020 161 24 - 336 ng/mL Final    Comment:    Performed at [facility], [address] [place], [place]         Passed - HGB in normal range and within 360 days    Hemoglobin  Date Value Ref Range Status  12/21/2021 13.7 13.0 - 17.7 g/dL Final         Passed - HCT in normal range and within 360 days    Hematocrit  Date Value Ref Range Status  12/21/2021 41.3 37.5 - 51.0 % Final         Passed - RBC in normal range and within 360 days    RBC  Date Value Ref Range Status  12/21/2021 4.40 4.14 - 5.80 x10E6/uL Final  07/17/2020 3.87 (L) 4.22 - 5.81 MIL/uL Final         Passed - Valid encounter within last 12 months    Recent Outpatient Visits           2 months ago Senile purpura (HCC)   [facility], [facility]   5 months ago Senile purpura (HCC)   [facility], NP   9 months ago Primary hypertension   [facility], NP   1 year ago Hematuria, unspecified type   [facility], NP   1 year ago Hematuria, unspecified type   [facility], NP       Future Appointments             In 1  week Larae Grooms, NP [facility], P[facility]

## 2022-06-18 ENCOUNTER — Telehealth: Payer: Self-pay

## 2022-06-18 NOTE — Progress Notes (Signed)
Chronic Care Management Pharmacy Assistant   Name: Caleb Fleming.  MRN: [id number] DOB: Dec 13, 1931    Reason for Encounter: Disease State   Conditions to be addressed/monitored: HTN   Recent office visits:  None since last coordination call on 05/09/22  Recent consult visits:  None since last coordination call on 05/09/22  Hospital visits:  None in previous 6 months  Medications: Outpatient Encounter Medications as of 06/18/2022  Medication Sig   amiodarone (PACERONE) 200 MG tablet TAKE 1 TABLET BY MOUTH EVERY DAY   acetaminophen (TYLENOL) 325 MG tablet Take 650 mg by mouth every 4 (four) hours as needed.   allopurinol (ZYLOPRIM) 100 MG tablet Take 1 tablet (100 mg total) by mouth daily.   ferrous sulfate 325 (65 FE) MG tablet TAKE 1 TABLET (325MG ) BY MOUTH EVERY DAY   metoprolol succinate (TOPROL-XL) 25 MG 24 hr tablet TAKE 1 TABLET BY MOUTH DAILY   potassium chloride (KLOR-CON) 10 MEQ tablet TAKE 1 TABLET BY MOUTH TWICE  DAILY   simvastatin (ZOCOR) 20 MG tablet TAKE 1 TABLET BY MOUTH DAILY AT  6 PM.   Zinc 220 (50 Zn) MG CAPS Take 1 capsule by mouth daily.   No facility-administered encounter medications on file as of 06/18/2022.   Reviewed chart prior to disease state call. Spoke with patient regarding BP  Recent Office Vitals: BP Readings from Last 3 Encounters:  03/23/22 (!) 140/82  12/21/21 130/72  09/05/21 132/64   Pulse Readings from Last 3 Encounters:  03/23/22 (!) 45  12/21/21 (!) 40  09/05/21 (!) 45    Wt Readings from Last 3 Encounters:  03/23/22 163 lb 11.2 oz (74.3 kg)  12/21/21 159 lb (72.1 kg)  09/05/21 152 lb 3.2 oz (69 kg)     Kidney Function Lab Results  Component Value Date/Time   CREATININE 1.30 (H) 03/23/2022 09:52 AM   CREATININE 1.36 (H) 12/21/2021 09:13 AM   GFRNONAA 50 (L) 08/31/2020 01:29 PM   GFRNONAA 43 (L) 07/17/2020 01:52 AM   GFRAA 58 (L) 08/31/2020 01:29 PM       Latest Ref Rng & Units 03/23/2022    9:52 AM  12/21/2021    9:13 AM 09/05/2021    8:42 AM  BMP  Glucose 70 - 99 mg/dL 82  88  84   BUN 8 - 27 mg/dL 25  22  15    Creatinine 0.76 - 1.27 mg/dL 09/07/2021   1.65   BUN/Creat Ratio 10 - 24 19  16  12    Sodium 134 - 144 mmol/L 140  139  140   Potassium 3.5 - 5.2 mmol/L 4.3  4.7  3.9   Chloride 96 - 106 mmol/L 102  104  106   CO2 20 - 29 mmol/L 23  22  21    Calcium 8.6 - 10.2 mg/dL 8.4  8.8  8.6    Note: Unsuccessful outbound call made today to assist with:  DM Adherence  Outreach Attempt:  3rd Attempt, to reach patient.  A HIPAA compliant voice message was left requesting a return call.  Instructed patient to call back at  earliest convenience.  Current antihypertensive regimen:  Metoprolol succinate 25 mg 1 tab daily   Adherence Review: Is the patient currently on ACE/ARB medication? No Does the patient have >5 day gap between last estimated fill dates? No  Care Gaps: Colonoscopy-NA Diabetic Foot Exam-NA Ophthalmology-NA Dexa Scan - NA Annual Well Visit - 04/24/21 (Medicare) Micro albumin-NA Hemoglobin A1c- NA  Star Rating Drugs: Simvastatin 20 mg Last filled:05/22/22 100 ds, 03/20/22 90 ds   [facility] Concierge [phone]

## 2022-06-21 ENCOUNTER — Other Ambulatory Visit: Payer: Self-pay | Admitting: Nurse Practitioner

## 2022-06-21 NOTE — Telephone Encounter (Signed)
Unable to refill per protocol, Rx request is too soon. Last refill 12/25/21 for 100 and 2 refills. Will refuse.  Requested Prescriptions  Pending Prescriptions Disp Refills   simvastatin (ZOCOR) 20 MG tablet [Pharmacy Med Name: SIMVASTATIN 20 MG TABLET] 90 tablet 4    Sig: TAKE 1 TABLET BY MOUTH DAILY AT 6 PM.     Cardiovascular:  Antilipid - Statins Failed - 06/21/2022 12:48 AM      Failed - Lipid Panel in normal range within the last 12 months    Cholesterol, Total  Date Value Ref Range Status  03/23/2022 139 100 - 199 mg/dL Final   LDL Chol Calc (NIH)  Date Value Ref Range Status  03/23/2022 68 0 - 99 mg/dL Final   HDL  Date Value Ref Range Status  03/23/2022 57 >39 mg/dL Final   Triglycerides  Date Value Ref Range Status  03/23/2022 72 0 - 149 mg/dL Final         Passed - Patient is not pregnant      Passed - Valid encounter within last 12 months    Recent Outpatient Visits           3 months ago Senile purpura (HCC)   [facility] Larae Grooms, NP   6 months ago Senile purpura (HCC)   [facility] Larae Grooms, NP   9 months ago Primary hypertension   [facility] Larae Grooms, NP   1 year ago Hematuria, unspecified type   [facility] Larae Grooms, NP   1 year ago Hematuria, unspecified type   [facility] Larae Grooms, NP       Future Appointments             Tomorrow Larae Grooms, NP [facility]

## 2022-06-21 NOTE — Progress Notes (Signed)
BP 138/68   Pulse (!) 47   Temp 97.7 F (36.5 C) (Oral)   Wt 160 lb (72.6 kg)   SpO2 97%   BMI 25.75 kg/m    Subjective:    Patient ID: Caleb Platt., male    DOB: [birth year], 88 y.o.   MRN: [id number]  HPI: Caleb Fleming. is a 88 y.o. male presenting on 06/22/2022 for comprehensive medical examination. Current medical complaints include:none  He currently lives with: Interim Problems from his last visit: no  HYPERTENSION / HYPERLIPIDEMIA Does not see Cardiology and does not want to. Satisfied with current treatment? yes Duration of hypertension: years BP monitoring frequency: not checking BP range:  BP medication side effects: no Past BP meds: Metoprolol Duration of hyperlipidemia: years Cholesterol medication side effects: no Cholesterol supplements: none Past cholesterol medications: simvastatin (zocor) Medication compliance: excellent compliance Aspirin: no Recent stressors: no Recurrent headaches: no Visual changes: no Palpitations: no Dyspnea: no Chest pain: no Lower extremity edema: no Dizzy/lightheaded: no   AFIB Rate Stable.  Denies concerns.  Does not see Cardiology.   ANXIETY AND DEPRESSION Patient denies SI/HI in office today. Denies feeling down or anxious.  Feels like everyday is a blessing.  Happy he got out of the bed today.   Functional Status Survey: Is the patient deaf or have difficulty hearing?: Yes Does the patient have difficulty seeing, even when wearing glasses/contacts?: Yes Does the patient have difficulty concentrating, remembering, or making decisions?: No Does the patient have difficulty walking or climbing stairs?: No Does the patient have difficulty dressing or bathing?: No Does the patient have difficulty doing errands alone such as visiting a doctor's office or shopping?: No  FALL RISK:    12/21/2021    8:42 AM 04/24/2021    8:24 AM 03/01/2021   10:32 AM 07/28/2020    8:44 AM 07/21/2020    8:12 AM  [place] in the past year? 0 0 0 1 1  Number falls in past yr: 0  0 1 0  Injury with Fall? 0  0 1 1  Risk for fall due to : No Fall Risks Medication side effect;Impaired balance/gait No Fall Risks  History of fall(s)  Follow up Falls evaluation completed Falls evaluation completed;Education provided;Falls prevention discussed       Depression Screen    04/24/2021    8:25 AM 03/01/2021   10:32 AM 02/15/2021   11:32 AM 07/28/2020    8:45 AM 07/21/2020    8:12 AM  Depression screen PHQ 2/9  Decreased Interest 0 0 0 0 0  Down, Depressed, Hopeless 0 0 0 0 0  PHQ - 2 Score 0 0 0 0 0  Altered sleeping  0     Tired, decreased energy  0     Change in appetite  0     Feeling bad or failure about yourself   0     Trouble concentrating  0     Moving slowly or fidgety/restless  0     Suicidal thoughts  0     PHQ-9 Score  0     Difficult doing work/chores  Not difficult at all       Advanced Directives Does not have an [place].  Does not want to make one.  Past Medical History:  Past Medical History:  Diagnosis Date   Anxiety    BPH (benign prostatic hypertrophy)    Depression    ED (  erectile dysfunction)    Gout    Hyperlipidemia    Hypertension    Insomnia     Surgical History:  History reviewed. No pertinent surgical history.  Medications:  Current Outpatient Medications on File Prior to Visit  Medication Sig   allopurinol (ZYLOPRIM) 100 MG tablet Take 1 tablet (100 mg total) by mouth daily.   amiodarone (PACERONE) 200 MG tablet TAKE 1 TABLET BY MOUTH EVERY DAY   ferrous sulfate 325 (65 FE) MG tablet TAKE 1 TABLET (325MG) BY MOUTH EVERY DAY   potassium chloride (KLOR-CON) 10 MEQ tablet TAKE 1 TABLET BY MOUTH TWICE  DAILY   simvastatin (ZOCOR) 20 MG tablet TAKE 1 TABLET BY MOUTH DAILY AT  6 PM.   Zinc 220 (50 Zn) MG CAPS Take 1 capsule by mouth daily.   No current facility-administered medications on file prior to visit.    Allergies:  Allergies   Allergen Reactions   Bactrim [Sulfamethoxazole-Trimethoprim] Itching   Codeine     Social History:  Social History   Socioeconomic History   Marital status: Widowed    Spouse name: Not on file   Number of children: Not on file   Years of education: Not on file   Highest education level: Not on file  Occupational History   Not on file  Tobacco Use   Smoking status: Former   Smokeless tobacco: Current    Types: Chew  Vaping Use   Vaping Use: Never used  Substance and Sexual Activity   Alcohol use: Yes    Comment: rarely   Drug use: Never   Sexual activity: Not on file  Other Topics Concern   Not on file  Social History Narrative   Not on file   Social Determinants of Health   Financial Resource Strain: Low Risk  (04/09/2022)   Overall Financial Resource Strain (CARDIA)    Difficulty of Paying Living Expenses: Not hard at all  Food Insecurity: No Food Insecurity (12/05/2021)   Hunger Vital Sign    Worried About Running Out of Food in the Last Year: Never true    [place] in the Last Year: Never true  Transportation Needs: No Transportation Needs (04/09/2022)   PRAPARE - Hydrologist (Medical): No    Lack of Transportation (Non-Medical): No  Physical Activity: Inactive (04/24/2021)   Exercise Vital Sign    Days of Exercise per Week: 0 days    Minutes of Exercise per Session: 0 min  Stress: No Stress Concern Present (04/24/2021)   [place]    Feeling of Stress : Not at all  Social Connections: Socially Isolated (02/15/2021)   Social Connection and Isolation Panel [NHANES]    Frequency of Communication with Friends and Family: More than three times a week    Frequency of Social Gatherings with Friends and Family: More than three times a week    Attends Religious Services: Never    Marine scientist or Organizations: No    Attends Archivist Meetings:  Never    Marital Status: Widowed  Intimate Partner Violence: Not At Risk (02/15/2021)   Humiliation, Afraid, Rape, and Kick questionnaire    Fear of Current or Ex-Partner: No    Emotionally Abused: No    Physically Abused: No    Sexually Abused: No   Social History   Tobacco Use  Smoking Status Former  Smokeless Tobacco Current   Types:  Chew   Social History   Substance and Sexual Activity  Alcohol Use Yes   Comment: rarely    Family History:  Family History  Problem Relation Age of Onset   Cancer Mother    Cancer Father     Past medical history, surgical history, medications, allergies, family history and social history reviewed with patient today and changes made to appropriate areas of the chart.   Review of Systems  Eyes:  Negative for blurred vision and double vision.  Respiratory:  Negative for shortness of breath.   Cardiovascular:  Negative for chest pain, palpitations and leg swelling.  Neurological:  Negative for dizziness and headaches.  Psychiatric/Behavioral:  Negative for depression.    All other ROS negative except what is listed above and in the HPI.      Objective:    BP 138/68   Pulse (!) 47   Temp 97.7 F (36.5 C) (Oral)   Wt 160 lb (72.6 kg)   SpO2 97%   BMI 25.75 kg/m   Wt Readings from Last 3 Encounters:  06/22/22 160 lb (72.6 kg)  03/23/22 163 lb 11.2 oz (74.3 kg)  12/21/21 159 lb (72.1 kg)    No results found.  Physical Exam Vitals and nursing note reviewed.  Constitutional:      General: He is not in acute distress.    Appearance: Normal appearance. He is not ill-appearing, toxic-appearing or diaphoretic.  HENT:     Head: Normocephalic.     Right Ear: External ear normal.     Left Ear: External ear normal.     Nose: Nose normal. No congestion or rhinorrhea.     Mouth/Throat:     Mouth: Mucous membranes are moist.  Eyes:     General:        Right eye: No discharge.        Left eye: No discharge.     Extraocular  Movements: Extraocular movements intact.     Conjunctiva/sclera: Conjunctivae normal.     Pupils: Pupils are equal, round, and reactive to light.  Cardiovascular:     Rate and Rhythm: Normal rate and regular rhythm.     Heart sounds: No murmur heard. Pulmonary:     Effort: Pulmonary effort is normal. No respiratory distress.     Breath sounds: Normal breath sounds. No wheezing, rhonchi or rales.  Abdominal:     General: Abdomen is flat. Bowel sounds are normal.  Musculoskeletal:     Cervical back: Normal range of motion and neck supple.  Skin:    General: Skin is warm and dry.     Capillary Refill: Capillary refill takes less than 2 seconds.  Neurological:     General: No focal deficit present.     Mental Status: He is alert and oriented to person, place, and time.  Psychiatric:        Mood and Affect: Mood normal.        Behavior: Behavior normal.        Thought Content: Thought content normal.        Judgment: Judgment normal.         No data to display          Cognitive Testing - 6-CIT  Correct? Score   What year is it? yes 0 Yes = 0    No = 4  What month is it? yes 0 Yes = 0    No = 3  Remember:     Pia [address]au,  [place], [place]     What time is it? yes 0 Yes = 0    No = 3  Count backwards from 20 to 1 yes 0 Correct = 0    1 error = 2   More than 1 error = 4  Say the months of the year in reverse. yes 0 Correct = 0    1 error = 2   More than 1 error = 4  What address did I ask you to remember? no 4 Correct = 0  1 error = 2    2 error = 4    3 error = 6    4 error = 8    All wrong = 10       TOTAL SCORE  4/28   Interpretation:  Normal  Normal (0-7) Abnormal (8-28)    Results for orders placed or performed in visit on 03/23/22  Comp Met (CMET)  Result Value Ref Range   Glucose 82 70 - 99 mg/dL   BUN 25 8 - 27 mg/dL   Creatinine, Ser 1.30 (H) 0.76 - 1.27 mg/dL   eGFR 53 (L) >59 mL/min/1.73   BUN/Creatinine Ratio 19 10 - 24   Sodium 140 134 - 144  mmol/L   Potassium 4.3 3.5 - 5.2 mmol/L   Chloride 102 96 - 106 mmol/L   CO2 23 20 - 29 mmol/L   Calcium 8.4 (L) 8.6 - 10.2 mg/dL   Total Protein 5.7 (L) 6.0 - 8.5 g/dL   Albumin 3.7 3.7 - 4.7 g/dL   Globulin, Total 2.0 1.5 - 4.5 g/dL   Albumin/Globulin Ratio 1.9 1.2 - 2.2   Bilirubin Total 0.4 0.0 - 1.2 mg/dL   Alkaline Phosphatase 96 44 - 121 IU/L   AST 33 0 - 40 IU/L   ALT 37 0 - 44 IU/L  Lipid Profile  Result Value Ref Range   Cholesterol, Total 139 100 - 199 mg/dL   Triglycerides 72 0 - 149 mg/dL   HDL 57 >39 mg/dL   VLDL Cholesterol Cal 14 5 - 40 mg/dL   LDL Chol Calc (NIH) 68 0 - 99 mg/dL   Chol/HDL Ratio 2.4 0.0 - 5.0 ratio      Assessment & Plan:   Problem List Items Addressed This Visit       Cardiovascular and Mediastinum   Hypertension    Chronic.  Controlled.  Elevated on first check and improved on second.  HR in the 40s are visit today.  Will stop Metoprolol and have patient follow up in one month.  Can add ACE/ARB if needed at next visit.  Does not check blood pressures at home.  Labs ordered today.  Return to clinic in 1 month for reevaluation.  Call sooner if concerns arise.        Relevant Orders   Comprehensive metabolic panel   AF (paroxysmal atrial fibrillation) (HCC)    Chronic.  Ongoing.  HR in the 40s during visit today.  Will stop Metoprolol.  Continue with Amiodarone.  Patient declines seeing Cardiology.  Follow up in 1 month.  Call sooner if concerns arise.       HFrEF (heart failure with reduced ejection fraction) (HCC)    Chronic, ongoing with rate-controlled at this time.  Continue current medication regimen and adjust as needed.  Patient declines going to Cardiology.  Patient has stopped Eliquis due to hematuria.  Labs today.  Follow up in 3 months.  Call sooner  if concerns arise.       Senile purpura ([place])    Reassured patient.  Will monitor in the future.       Aortic atherosclerosis (HCC)    Chronic. Controlled.  Continue with  current medication regimen of Simvastatin 66m daily. Labs ordered today.  Follow up in 3 months.  Call sooner if concerns arise.         Genitourinary   CKD (chronic kidney disease), stage III (HCC)    Chronic. Labs ordered today.  Will make recommendations based on lab results.  Patient declines referral to Nephrology. Will reassess at future visits.  Continue current medication regimen.       Relevant Orders   CBC with Differential/Platelet     Other   Hyperlipidemia    Chronic.  Controlled.  Continue with current medication regimen on Simvastatin 279m  Labs ordered today.  Return to clinic in 3 months for reevaluation.  Call sooner if concerns arise.        Relevant Orders   Lipid panel   Anxiety and depression    Chronic.  Controlled without medication.  Does not feel like he needs medication at this time.  Will reassess at future visits.  Labs ordered today.  Return to clinic in 3 months for reevaluation.  Call sooner if concerns arise.        Other Visit Diagnoses     Encounter for annual wellness exam in Medicare patient    -  Primary   Need for influenza vaccination       Relevant Orders   Flu Vaccine QUAD High Dose(Fluad) (Completed)        Preventative Services:  Health Risk Assessment and Personalized Prevention Plan: reviewed Bone Mass Measurements: NA CVD Screening: Up to date Colon Cancer Screening: NA Depression Screening: Up to date Diabetes Screening: Up to date Glaucoma Screening: Needs eye exam Hepatitis B vaccine: NA Hepatitis C screening: Up to date HIV Screening: Up to date Flu Vaccine: Up to date Lung cancer Screening: NA Obesity Screening: Up to date Pneumonia Vaccines (2): Up to date STI Screening: NA PSA screening: Up to date  Discussed aspirin prophylaxis for myocardial infarction prevention and decision was it was not indicated  LABORATORY TESTING:  Health maintenance labs ordered today as discussed above.   The natural history  of prostate cancer and ongoing controversy regarding screening and potential treatment outcomes of prostate cancer has been discussed with the patient. The meaning of a false positive PSA and a false negative PSA has been discussed. He indicates understanding of the limitations of this screening test and wishes to proceed with screening PSA testing.   IMMUNIZATIONS:   - Tdap: Tetanus vaccination status reviewed: last tetanus booster within 10 years. - Influenza: Up to date - Pneumovax: Up to date - Prevnar: Up to date - Zostavax vaccine: Not applicable  SCREENING: - Colonoscopy: Not applicable  Discussed with patient purpose of the colonoscopy is to detect colon cancer at curable precancerous or early stages   - AAA Screening: Not applicable  -Hearing Test: Not applicable  -Spirometry: Not applicable   PATIENT COUNSELING:    Sexuality: Discussed sexually transmitted diseases, partner selection, use of condoms, avoidance of unintended pregnancy  and contraceptive alternatives.   Advised to avoid cigarette smoking.  I discussed with the patient that most people either abstain from alcohol or drink within safe limits (<=14/week and <=4 drinks/occasion for males, <=7/weeks and <= 3 drinks/occasion for females) and that the risk  for alcohol disorders and other health effects rises proportionally with the number of drinks per week and how often a drinker exceeds daily limits.  Discussed cessation/primary prevention of drug use and availability of treatment for abuse.   Diet: Encouraged to adjust caloric intake to maintain  or achieve ideal body weight, to reduce intake of dietary saturated fat and total fat, to limit sodium intake by avoiding high sodium foods and not adding table salt, and to maintain adequate dietary potassium and calcium preferably from fresh fruits, vegetables, and low-fat dairy products.    stressed the importance of regular exercise  Injury prevention: Discussed  safety belts, safety helmets, smoke detector, smoking near bedding or upholstery.   Dental health: Discussed importance of regular tooth brushing, flossing, and dental visits.   Follow up plan: NEXT PREVENTATIVE PHYSICAL DUE IN 1 YEAR. Return in about 1 month (around 07/23/2022) for BP Check and HR check .

## 2022-06-22 ENCOUNTER — Ambulatory Visit (INDEPENDENT_AMBULATORY_CARE_PROVIDER_SITE_OTHER): Payer: Medicare Other | Admitting: Nurse Practitioner

## 2022-06-22 ENCOUNTER — Encounter: Payer: Self-pay | Admitting: Nurse Practitioner

## 2022-06-22 VITALS — BP 138/68 | HR 47 | Temp 97.7°F | Wt 160.0 lb

## 2022-06-22 DIAGNOSIS — F32A Depression, unspecified: Secondary | ICD-10-CM

## 2022-06-22 DIAGNOSIS — I700 Atherosclerosis of aorta: Secondary | ICD-10-CM

## 2022-06-22 DIAGNOSIS — D692 Other nonthrombocytopenic purpura: Secondary | ICD-10-CM

## 2022-06-22 DIAGNOSIS — I5020 Unspecified systolic (congestive) heart failure: Secondary | ICD-10-CM

## 2022-06-22 DIAGNOSIS — E782 Mixed hyperlipidemia: Secondary | ICD-10-CM | POA: Diagnosis not present

## 2022-06-22 DIAGNOSIS — Z23 Encounter for immunization: Secondary | ICD-10-CM

## 2022-06-22 DIAGNOSIS — Z0000 Encounter for general adult medical examination without abnormal findings: Secondary | ICD-10-CM

## 2022-06-22 DIAGNOSIS — N1832 Chronic kidney disease, stage 3b: Secondary | ICD-10-CM | POA: Diagnosis not present

## 2022-06-22 DIAGNOSIS — I129 Hypertensive chronic kidney disease with stage 1 through stage 4 chronic kidney disease, or unspecified chronic kidney disease: Secondary | ICD-10-CM

## 2022-06-22 DIAGNOSIS — I10 Essential (primary) hypertension: Secondary | ICD-10-CM | POA: Diagnosis not present

## 2022-06-22 DIAGNOSIS — F419 Anxiety disorder, unspecified: Secondary | ICD-10-CM

## 2022-06-22 DIAGNOSIS — I480 Paroxysmal atrial fibrillation: Secondary | ICD-10-CM

## 2022-06-22 NOTE — Assessment & Plan Note (Signed)
Chronic, ongoing with rate-controlled at this time.  Continue current medication regimen and adjust as needed.  Patient declines going to Cardiology.  Patient has stopped Eliquis due to hematuria.  Labs today.  Follow up in 3 months.  Call sooner if concerns arise.

## 2022-06-22 NOTE — Assessment & Plan Note (Signed)
Chronic. Labs ordered today.  Will make recommendations based on lab results.  Patient declines referral to Nephrology. Will reassess at future visits.  Continue current medication regimen.

## 2022-06-22 NOTE — Assessment & Plan Note (Signed)
Chronic.  Ongoing.  HR in the 40s during visit today.  Will stop Metoprolol.  Continue with Amiodarone.  Patient declines seeing Cardiology.  Follow up in 1 month.  Call sooner if concerns arise.

## 2022-06-22 NOTE — Assessment & Plan Note (Signed)
Chronic.  Controlled.  Elevated on first check and improved on second.  HR in the 40s are visit today.  Will stop Metoprolol and have patient follow up in one month.  Can add ACE/ARB if needed at next visit.  Does not check blood pressures at home.  Labs ordered today.  Return to clinic in 1 month for reevaluation.  Call sooner if concerns arise.

## 2022-06-22 NOTE — Assessment & Plan Note (Signed)
Chronic.  Controlled without medication.  Does not feel like he needs medication at this time.  Will reassess at future visits.  Labs ordered today.  Return to clinic in 3 months for reevaluation.  Call sooner if concerns arise.

## 2022-06-22 NOTE — Assessment & Plan Note (Signed)
Chronic.  Controlled.  Continue with current medication regimen on Simvastatin 20mg .  Labs ordered today.  Return to clinic in 3 months for reevaluation.  Call sooner if concerns arise.

## 2022-06-22 NOTE — Assessment & Plan Note (Signed)
Reassured patient.  Will monitor in the future.

## 2022-06-22 NOTE — Assessment & Plan Note (Signed)
Chronic. Controlled.  Continue with current medication regimen of Simvastatin 20mg  daily. Labs ordered today.  Follow up in 3 months.  Call sooner if concerns arise.

## 2022-06-23 LAB — COMPREHENSIVE METABOLIC PANEL
ALT: 25 IU/L (ref 0–44)
AST: 23 IU/L (ref 0–40)
Albumin/Globulin Ratio: 1.8 (ref 1.2–2.2)
Albumin: 4 g/dL (ref 3.7–4.7)
Alkaline Phosphatase: 91 IU/L (ref 44–121)
BUN/Creatinine Ratio: 17 (ref 10–24)
BUN: 24 mg/dL (ref 8–27)
Bilirubin Total: 0.4 mg/dL (ref 0.0–1.2)
CO2: 21 mmol/L (ref 20–29)
Calcium: 9 mg/dL (ref 8.6–10.2)
Chloride: 106 mmol/L (ref 96–106)
Creatinine, Ser: 1.42 mg/dL — ABNORMAL HIGH (ref 0.76–1.27)
Globulin, Total: 2.2 g/dL (ref 1.5–4.5)
Glucose: 84 mg/dL (ref 70–99)
Potassium: 4.3 mmol/L (ref 3.5–5.2)
Sodium: 140 mmol/L (ref 134–144)
Total Protein: 6.2 g/dL (ref 6.0–8.5)
eGFR: 48 mL/min/{1.73_m2} — ABNORMAL LOW (ref >59)

## 2022-06-23 LAB — CBC WITH DIFFERENTIAL/PLATELET
Basophils Absolute: 0.1 10*3/uL (ref 0.0–0.2)
Basos: 1 %
EOS (ABSOLUTE): 0.5 10*3/uL — ABNORMAL HIGH (ref 0.0–0.4)
Eos: 5 %
Hematocrit: 43.4 % (ref 37.5–51.0)
Hemoglobin: 14.4 g/dL (ref 13.0–17.7)
Immature Grans (Abs): 0.1 10*3/uL (ref 0.0–0.1)
Immature Granulocytes: 1 %
Lymphocytes Absolute: 1.9 10*3/uL (ref 0.7–3.1)
Lymphs: 17 %
MCH: 31 pg (ref 26.6–33.0)
MCHC: 33.2 g/dL (ref 31.5–35.7)
MCV: 94 fL (ref 79–97)
Monocytes Absolute: 1.3 10*3/uL — ABNORMAL HIGH (ref 0.1–0.9)
Monocytes: 12 %
Neutrophils Absolute: 7.4 10*3/uL — ABNORMAL HIGH (ref 1.4–7.0)
Neutrophils: 64 %
Platelets: 239 10*3/uL (ref 150–450)
RBC: 4.64 x10E6/uL (ref 4.14–5.80)
RDW: 12.8 % (ref 11.6–15.4)
WBC: 11.2 10*3/uL — ABNORMAL HIGH (ref 3.4–10.8)

## 2022-06-23 LAB — LIPID PANEL
Chol/HDL Ratio: 2.6 ratio (ref 0.0–5.0)
Cholesterol, Total: 157 mg/dL (ref 100–199)
HDL: 60 mg/dL (ref >39)
LDL Chol Calc (NIH): 83 mg/dL (ref 0–99)
Triglycerides: 73 mg/dL (ref 0–149)
VLDL Cholesterol Cal: 14 mg/dL (ref 5–40)

## 2022-06-25 NOTE — Progress Notes (Signed)
Please let patient know that his lab work looks good.  Remind him to stop the Metoprolol.  His kidney function remains stable.  His white blood cell count is slightly elevated.  We will recheck this at his next visit.  Continue with current medication regimen.  Follow up as discussed.

## 2022-07-12 ENCOUNTER — Other Ambulatory Visit: Payer: Self-pay | Admitting: Nurse Practitioner

## 2022-07-13 NOTE — Telephone Encounter (Signed)
Unable to refill per protocol, Rx expired. Medication was discontinued 06/22/22, course completed.  Requested Prescriptions  Pending Prescriptions Disp Refills   metoprolol succinate (TOPROL-XL) 25 MG 24 hr tablet [Pharmacy Med Name: Metoprolol Succinate ER 25 MG Oral Tablet Extended Release 24 Hour] 100 tablet 2    Sig: TAKE 1 TABLET BY MOUTH DAILY     Cardiovascular:  Beta Blockers Passed - 07/12/2022 10:46 PM      Passed - Last BP in normal range    BP Readings from Last 1 Encounters:  06/22/22 138/68         Passed - Last Heart Rate in normal range    Pulse Readings from Last 1 Encounters:  06/22/22 (!) 47         Passed - Valid encounter within last 6 months    Recent Outpatient Visits           3 weeks ago Encounter for annual wellness exam in Medicare patient   [place], Santiago Glad, NP   3 months ago Senile purpura ([place])   [facility] Jon Billings, NP   6 months ago Senile purpura ([place])   [place], NP   10 months ago Primary hypertension   [facility] Jon Billings, NP   1 year ago Hematuria, unspecified type   [facility] Jon Billings, NP       Future Appointments             In 1 week Jon Billings, NP [facility] [place]

## 2022-07-15 ENCOUNTER — Other Ambulatory Visit: Payer: Self-pay | Admitting: Nurse Practitioner

## 2022-07-16 NOTE — Telephone Encounter (Signed)
Requested Prescriptions  Pending Prescriptions Disp Refills   allopurinol (ZYLOPRIM) 100 MG tablet [Pharmacy Med Name: Allopurinol 100 MG Oral Tablet] 100 tablet 0    Sig: TAKE 1 TABLET BY MOUTH DAILY     Endocrinology:  Gout Agents - allopurinol Failed - 07/15/2022 10:57 PM      Failed - Uric Acid in normal range and within 360 days    Uric Acid  Date Value Ref Range Status  11/20/2019 10.0 (H) 3.8 - 8.4 mg/dL Final    Comment:               Therapeutic target for gout patients: <6.0         Failed - Cr in normal range and within 360 days    Creatinine, Ser  Date Value Ref Range Status  06/22/2022 1.42 (H) 0.76 - 1.27 mg/dL Final         Passed - Valid encounter within last 12 months    Recent Outpatient Visits           3 weeks ago Encounter for annual wellness exam in Medicare patient   [facility] Jon Billings, NP   3 months ago Senile purpura ([place])   [facility] Jon Billings, NP   6 months ago Senile purpura ([place])   [facility] Jon Billings, NP   10 months ago Primary hypertension   [facility] Jon Billings, NP   1 year ago Hematuria, unspecified type   [facility] Jon Billings, NP       Future Appointments             In 1 week Jon Billings, NP [facility], [facility]            Passed - CBC within normal limits and completed in the last 12 months    WBC  Date Value Ref Range Status  06/22/2022 11.2 (H) 3.4 - 10.8 x10E3/uL Final  07/17/2020 12.9 (H) 4.0 - 10.5 K/uL Final   RBC  Date Value Ref Range Status  06/22/2022 4.64 4.14 - 5.80 x10E6/uL Final  07/17/2020 3.87 (L) 4.22 - 5.81 MIL/uL Final   Hemoglobin  Date Value Ref Range Status  06/22/2022 14.4 13.0 - 17.7 g/dL Final   Hematocrit  Date Value Ref Range Status  06/22/2022 43.4 37.5 - 51.0 % Final   MCHC  Date Value Ref Range Status  06/22/2022 33.2 31.5 - 35.7 g/dL Final  07/17/2020 33.2  30.0 - 36.0 g/dL Final   [facility]  Date Value Ref Range Status  06/22/2022 31.0 26.6 - 33.0 pg Final  07/17/2020 31.0 26.0 - 34.0 pg Final   MCV  Date Value Ref Range Status  06/22/2022 94 79 - 97 fL Final   No results found for: "PLTCOUNTKUC", "LABPLAT", "POCPLA" RDW  Date Value Ref Range Status  06/22/2022 12.8 11.6 - 15.4 % Final

## 2022-07-24 ENCOUNTER — Ambulatory Visit: Payer: Self-pay

## 2022-07-24 ENCOUNTER — Encounter: Payer: Self-pay | Admitting: Nurse Practitioner

## 2022-07-24 ENCOUNTER — Ambulatory Visit (INDEPENDENT_AMBULATORY_CARE_PROVIDER_SITE_OTHER): Payer: Medicare Other | Admitting: Nurse Practitioner

## 2022-07-24 VITALS — BP 202/72 | HR 53 | Temp 97.8°F | Wt 165.5 lb

## 2022-07-24 DIAGNOSIS — R319 Hematuria, unspecified: Secondary | ICD-10-CM | POA: Diagnosis not present

## 2022-07-24 DIAGNOSIS — I10 Essential (primary) hypertension: Secondary | ICD-10-CM | POA: Diagnosis not present

## 2022-07-24 MED ORDER — VALSARTAN 40 MG PO TABS: 40.0000 mg | ORAL_TABLET | Freq: Every day | ORAL | 0 refills | Status: DC

## 2022-07-24 NOTE — Telephone Encounter (Signed)
According to chart, patient is to be taking Valsartan along with other meds correct?

## 2022-07-24 NOTE — Telephone Encounter (Signed)
  Chief Complaint: Medication question Symptoms: none Frequency:  Pertinent Negatives: Patient denies  Disposition: [] ED /[] Urgent Care (no appt availability in office) / [] Appointment(In office/virtual)/ []  [facility]/ [] Home Care/ [] Refused Recommended Disposition /[] [facility]/ [x]  Follow-up with PCP Additional Notes: PT was seen in office today. Pt was given Valsartan. PT is unsure if Valsartan is to replace other BP medications or be added. Please advise. Pt states that he does not know how the provider knows which medicine is actually working.  Pt would like a call back.    Summary: Med Management   Pt stated his BP medication was changed today, and he wanted to ask PCP if he is supposed to take the medication she gave him today with the medication he has left at home. When I asked what the names of the medications were, he stated that he couldn't really see the names.   Pt seeking clinical advice.     Reason for Disposition  [1] Caller has URGENT medicine question about med that PCP or specialist prescribed AND [2] triager unable to answer question  Answer Assessment - Initial Assessment Questions 1. NAME of MEDICINE: "What medicine(s) are you calling about?"     BP medications 2. QUESTION: "What is your question?" (e.g., double dose of medicine, side effect)     What should he be taking now 3. PRESCRIBER: "Who prescribed the medicine?" Reason: if prescribed by specialist, call should be referred to that group.     Jon Billings 4. SYMPTOMS: "Do you have any symptoms?" If Yes, ask: "What symptoms are you having?"  "How bad are the symptoms (e.g., mild, moderate, severe)      5. PREGNANCY:  "Is there any chance that you are pregnant?" "When was your last menstrual period?"  Protocols used: Medication Question Call-A-AH

## 2022-07-24 NOTE — Assessment & Plan Note (Signed)
Will check CBC at visit today.  Patient stopped his iron supplement.  Will make recommendations based on lab results.

## 2022-07-24 NOTE — Telephone Encounter (Signed)
Called and LVM asking for patient to please return my call.   OK for [facility] to speak to patient and let him know Karen's message.

## 2022-07-24 NOTE — Progress Notes (Signed)
BP (!) 202/72 (BP Location: Left Arm, Cuff Size: Small)   Pulse (!) 53   Temp 97.8 F (36.6 C) (Oral)   Wt 165 lb 8 oz (75.1 kg)   SpO2 98%   BMI 26.63 kg/m    Subjective:    Patient ID: Caleb Shiver., male    DOB: 06-Sep-1931, 89 y.o.   MRN: [id number]  HPI: Caleb Fleming is a 89 y.o. male  Chief Complaint  Patient presents with   Hypertension   HYPERTENSION with Chronic Kidney Disease Hypertension status: uncontrolled  Satisfied with current treatment? yes Duration of hypertension: years BP monitoring frequency:  not checking BP range:  BP medication side effects:  no Medication compliance: excellent compliance Previous BP meds:valsartan Aspirin: no Recurrent headaches: yes Visual changes: no Palpitations: no Dyspnea: no Chest pain: no Lower extremity edema: no Dizzy/lightheaded: no  Patient has been having headaches in the morning.  He stopped his ferrous sulfate and headaches went away.    Relevant past medical, surgical, family and social history reviewed and updated as indicated. Interim medical history since our last visit reviewed. Allergies and medications reviewed and updated.  Review of Systems  Eyes:  Negative for visual disturbance.  Respiratory:  Negative for shortness of breath.   Cardiovascular:  Negative for chest pain and leg swelling.  Neurological:  Positive for headaches. Negative for light-headedness.    Per HPI unless specifically indicated above     Objective:    BP (!) 202/72 (BP Location: Left Arm, Cuff Size: Small)   Pulse (!) 53   Temp 97.8 F (36.6 C) (Oral)   Wt 165 lb 8 oz (75.1 kg)   SpO2 98%   BMI 26.63 kg/m   Wt Readings from Last 3 Encounters:  07/24/22 165 lb 8 oz (75.1 kg)  06/22/22 160 lb (72.6 kg)  03/23/22 163 lb 11.2 oz (74.3 kg)    Physical Exam Vitals and nursing note reviewed.  Constitutional:      General: He is not in acute distress.    Appearance: Normal appearance. He is not  ill-appearing, toxic-appearing or diaphoretic.  HENT:     Head: Normocephalic.     Right Ear: External ear normal.     Left Ear: External ear normal.     Nose: Nose normal. No congestion or rhinorrhea.     Mouth/Throat:     Mouth: Mucous membranes are moist.  Eyes:     General:        Right eye: No discharge.        Left eye: No discharge.     Extraocular Movements: Extraocular movements intact.     Conjunctiva/sclera: Conjunctivae normal.     Pupils: Pupils are equal, round, and reactive to light.  Cardiovascular:     Rate and Rhythm: Normal rate and regular rhythm.     Heart sounds: Murmur heard.  Pulmonary:     Effort: Pulmonary effort is normal. No respiratory distress.     Breath sounds: Normal breath sounds. No wheezing, rhonchi or rales.  Abdominal:     General: Abdomen is flat. Bowel sounds are normal.  Musculoskeletal:     Cervical back: Normal range of motion and neck supple.  Skin:    General: Skin is warm and dry.     Capillary Refill: Capillary refill takes less than 2 seconds.  Neurological:     General: No focal deficit present.     Mental Status: He is alert and oriented to  person, place, and time.  Psychiatric:        Mood and Affect: Mood normal.        Behavior: Behavior normal.        Thought Content: Thought content normal.        Judgment: Judgment normal.     Results for orders placed or performed in visit on 06/22/22  Lipid panel  Result Value Ref Range   Cholesterol, Total 157 100 - 199 mg/dL   Triglycerides 73 0 - 149 mg/dL   HDL 60 >39 mg/dL   VLDL Cholesterol Cal 14 5 - 40 mg/dL   LDL Chol Calc (NIH) 83 0 - 99 mg/dL   Chol/HDL Ratio 2.6 0.0 - 5.0 ratio  CBC with Differential/Platelet  Result Value Ref Range   WBC 11.2 (H) 3.4 - 10.8 x10E3/uL   RBC 4.64 4.14 - 5.80 x10E6/uL   Hemoglobin 14.4 13.0 - 17.7 g/dL   Hematocrit 43.4 37.5 - 51.0 %   MCV 94 79 - 97 fL   MCH 31.0 26.6 - 33.0 pg   MCHC 33.2 31.5 - 35.7 g/dL   RDW 12.8 11.6 -  15.4 %   Platelets 239 150 - 450 x10E3/uL   Neutrophils 64 Not Estab. %   Lymphs 17 Not Estab. %   Monocytes 12 Not Estab. %   Eos 5 Not Estab. %   Basos 1 Not Estab. %   Neutrophils Absolute 7.4 (H) 1.4 - 7.0 x10E3/uL   Lymphocytes Absolute 1.9 0.7 - 3.1 x10E3/uL   Monocytes Absolute 1.3 (H) 0.1 - 0.9 x10E3/uL   EOS (ABSOLUTE) 0.5 (H) 0.0 - 0.4 x10E3/uL   Basophils Absolute 0.1 0.0 - 0.2 x10E3/uL   Immature Granulocytes 1 Not Estab. %   Immature Grans (Abs) 0.1 0.0 - 0.1 x10E3/uL  Comprehensive metabolic panel  Result Value Ref Range   Glucose 84 70 - 99 mg/dL   BUN 24 8 - 27 mg/dL   Creatinine, Ser 1.42 (H) 0.76 - 1.27 mg/dL   eGFR 48 (L) >59 mL/min/1.73   BUN/Creatinine Ratio 17 10 - 24   Sodium 140 134 - 144 mmol/L   Potassium 4.3 3.5 - 5.2 mmol/L   Chloride 106 96 - 106 mmol/L   CO2 21 20 - 29 mmol/L   Calcium 9.0 8.6 - 10.2 mg/dL   Total Protein 6.2 6.0 - 8.5 g/dL   Albumin 4.0 3.7 - 4.7 g/dL   Globulin, Total 2.2 1.5 - 4.5 g/dL   Albumin/Globulin Ratio 1.8 1.2 - 2.2   Bilirubin Total 0.4 0.0 - 1.2 mg/dL   Alkaline Phosphatase 91 44 - 121 IU/L   AST 23 0 - 40 IU/L   ALT 25 0 - 44 IU/L      Assessment & Plan:   Problem List Items Addressed This Visit       Cardiovascular and Mediastinum   Hypertension - Primary    Chronic.  Controlled.  HR improved at visit today.  Will add Valsartan 40mg  daily.  Side effects and benefits of medications discussed during visit.  Does not check blood pressures at home.  Labs ordered today.  Return to clinic in 1 month for reevaluation.  Call sooner if concerns arise.        Relevant Medications   valsartan (DIOVAN) 40 MG tablet     Other   Hematuria    Will check CBC at visit today.  Patient stopped his iron supplement.  Will make recommendations based on lab results.  Relevant Orders   CBC w/Diff     Follow up plan: Return in about 1 month (around 08/24/2022).

## 2022-07-24 NOTE — Telephone Encounter (Signed)
Valsartan will be added to his regimen.

## 2022-07-24 NOTE — Assessment & Plan Note (Addendum)
Chronic.  Controlled.  HR improved at visit today.  Will add Valsartan 40mg  daily.  Side effects and benefits of medications discussed during visit.  Does not check blood pressures at home.  Labs ordered today.  Return to clinic in 1 month for reevaluation.  Call sooner if concerns arise.

## 2022-07-25 LAB — CBC WITH DIFFERENTIAL/PLATELET
Basophils Absolute: 0.1 10*3/uL (ref 0.0–0.2)
Basos: 1 %
EOS (ABSOLUTE): 0.5 10*3/uL — ABNORMAL HIGH (ref 0.0–0.4)
Eos: 6 %
Hematocrit: 41.4 % (ref 37.5–51.0)
Hemoglobin: 13.7 g/dL (ref 13.0–17.7)
Immature Grans (Abs): 0.1 10*3/uL (ref 0.0–0.1)
Immature Granulocytes: 1 %
Lymphocytes Absolute: 1.7 10*3/uL (ref 0.7–3.1)
Lymphs: 19 %
MCH: 31 pg (ref 26.6–33.0)
MCHC: 33.1 g/dL (ref 31.5–35.7)
MCV: 94 fL (ref 79–97)
Monocytes Absolute: 1.1 10*3/uL — ABNORMAL HIGH (ref 0.1–0.9)
Monocytes: 12 %
Neutrophils Absolute: 5.3 10*3/uL (ref 1.4–7.0)
Neutrophils: 61 %
Platelets: 251 10*3/uL (ref 150–450)
RBC: 4.42 x10E6/uL (ref 4.14–5.80)
RDW: 13.2 % (ref 11.6–15.4)
WBC: 8.7 10*3/uL (ref 3.4–10.8)

## 2022-07-25 NOTE — Progress Notes (Signed)
Please let patient know that his lab work looks good.  He does not need to resume the Ferrous Sulfate at this time.

## 2022-07-25 NOTE — Telephone Encounter (Signed)
Called and LVM asking for patient to please return my call.    OK for [facility] to speak to patient and advise about medication.

## 2022-08-24 ENCOUNTER — Encounter: Payer: Self-pay | Admitting: Nurse Practitioner

## 2022-08-24 ENCOUNTER — Ambulatory Visit (INDEPENDENT_AMBULATORY_CARE_PROVIDER_SITE_OTHER): Payer: Medicare Other | Admitting: Nurse Practitioner

## 2022-08-24 VITALS — BP 198/68 | HR 57 | Temp 97.5°F | Wt 164.0 lb

## 2022-08-24 DIAGNOSIS — I10 Essential (primary) hypertension: Secondary | ICD-10-CM

## 2022-08-24 MED ORDER — VALSARTAN 80 MG PO TABS: 80.0000 mg | ORAL_TABLET | Freq: Every day | ORAL | 1 refills | Status: DC

## 2022-08-24 NOTE — Progress Notes (Signed)
BP (!) 198/68   Pulse (!) 57   Temp (!) 97.5 F (36.4 C) (Oral)   Wt 164 lb (74.4 kg)   SpO2 97%   BMI 26.39 kg/m    Subjective:    Patient ID: Caleb Fleming., male    DOB: 1931-12-09, 89 y.o.   MRN: [id number]  HPI: Gualberto Gane is a 89 y.o. male  Chief Complaint  Patient presents with   Hyperlipidemia   Hypertension   HYPERTENSION with Chronic Kidney Disease Hypertension status: uncontrolled  Satisfied with current treatment? no Duration of hypertension: years BP monitoring frequency:  not checking BP range:  BP medication side effects:  no Medication compliance: excellent compliance Previous BP meds:valsartan Aspirin: no Recurrent headaches: no Visual changes: no Palpitations: no Dyspnea: no Chest pain: no Lower extremity edema: no Dizzy/lightheaded: no  Relevant past medical, surgical, family and social history reviewed and updated as indicated. Interim medical history since our last visit reviewed. Allergies and medications reviewed and updated.  Review of Systems  Eyes:  Negative for visual disturbance.  Respiratory:  Negative for shortness of breath.   Cardiovascular:  Negative for chest pain and leg swelling.  Neurological:  Negative for light-headedness and headaches.    Per HPI unless specifically indicated above     Objective:    BP (!) 198/68   Pulse (!) 57   Temp (!) 97.5 F (36.4 C) (Oral)   Wt 164 lb (74.4 kg)   SpO2 97%   BMI 26.39 kg/m   Wt Readings from Last 3 Encounters:  08/24/22 164 lb (74.4 kg)  07/24/22 165 lb 8 oz (75.1 kg)  06/22/22 160 lb (72.6 kg)    Physical Exam Vitals and nursing note reviewed.  Constitutional:      General: He is not in acute distress.    Appearance: Normal appearance. He is not ill-appearing, toxic-appearing or diaphoretic.  HENT:     Head: Normocephalic.     Right Ear: External ear normal.     Left Ear: External ear normal.     Nose: Nose normal. No congestion or rhinorrhea.      Mouth/Throat:     Mouth: Mucous membranes are moist.  Eyes:     General:        Right eye: No discharge.        Left eye: No discharge.     Extraocular Movements: Extraocular movements intact.     Conjunctiva/sclera: Conjunctivae normal.     Pupils: Pupils are equal, round, and reactive to light.  Cardiovascular:     Rate and Rhythm: Normal rate and regular rhythm.     Heart sounds: No murmur heard. Pulmonary:     Effort: Pulmonary effort is normal. No respiratory distress.     Breath sounds: Normal breath sounds. No wheezing, rhonchi or rales.  Abdominal:     General: Abdomen is flat. Bowel sounds are normal.  Musculoskeletal:     Cervical back: Normal range of motion and neck supple.  Skin:    General: Skin is warm and dry.     Capillary Refill: Capillary refill takes less than 2 seconds.  Neurological:     General: No focal deficit present.     Mental Status: He is alert and oriented to person, place, and time.  Psychiatric:        Mood and Affect: Mood normal.        Behavior: Behavior normal.        Thought Content:  Thought content normal.        Judgment: Judgment normal.     Results for orders placed or performed in visit on 07/24/22  CBC w/Diff  Result Value Ref Range   WBC 8.7 3.4 - 10.8 x10E3/uL   RBC 4.42 4.14 - 5.80 x10E6/uL   Hemoglobin 13.7 13.0 - 17.7 g/dL   Hematocrit 41.4 37.5 - 51.0 %   MCV 94 79 - 97 fL   MCH 31.0 26.6 - 33.0 pg   MCHC 33.1 31.5 - 35.7 g/dL   RDW 13.2 11.6 - 15.4 %   Platelets 251 150 - 450 x10E3/uL   Neutrophils 61 Not Estab. %   Lymphs 19 Not Estab. %   Monocytes 12 Not Estab. %   Eos 6 Not Estab. %   Basos 1 Not Estab. %   Neutrophils Absolute 5.3 1.4 - 7.0 x10E3/uL   Lymphocytes Absolute 1.7 0.7 - 3.1 x10E3/uL   Monocytes Absolute 1.1 (H) 0.1 - 0.9 x10E3/uL   EOS (ABSOLUTE) 0.5 (H) 0.0 - 0.4 x10E3/uL   Basophils Absolute 0.1 0.0 - 0.2 x10E3/uL   Immature Granulocytes 1 Not Estab. %   Immature Grans (Abs) 0.1 0.0 -  0.1 x10E3/uL      Assessment & Plan:   Problem List Items Addressed This Visit       Cardiovascular and Mediastinum   Hypertension - Primary    Chronic. Not well controlled.  Will increase Valsartan to 67m daily.  Continue with other medications.  Follow up in 1 month. Call sooner if concerns arise.       Relevant Medications   valsartan (DIOVAN) 80 MG tablet     Follow up plan: Return in about 1 month (around 09/22/2022) for BP Check.

## 2022-08-24 NOTE — Assessment & Plan Note (Signed)
Chronic. Not well controlled.  Will increase Valsartan to 53m daily.  Continue with other medications.  Follow up in 1 month. Call sooner if concerns arise.

## 2022-09-07 ENCOUNTER — Other Ambulatory Visit: Payer: Self-pay | Admitting: Nurse Practitioner

## 2022-09-07 ENCOUNTER — Telehealth: Payer: Self-pay

## 2022-09-07 NOTE — Telephone Encounter (Signed)
Attempted to contact patient at both hoe/cell number- [facility] and [facility] is at [facility]- no answer and unable to leave VM- will send 90 day supply in case patient is changing pharmacy Requested Prescriptions  Pending Prescriptions Disp Refills   allopurinol (ZYLOPRIM) 100 MG tablet [Pharmacy Med Name: ALLOPURINOL 100 MG TABLET] 90 tablet 2    Sig: TAKE 1 [place]     Endocrinology:  Gout Agents - allopurinol Failed - 09/07/2022  1:49 AM      Failed - Uric Acid in normal range and within 360 days    Uric Acid  Date Value Ref Range Status  11/20/2019 10.0 (H) 3.8 - 8.4 mg/dL Final    Comment:               Therapeutic target for gout patients: <6.0         Failed - Cr in normal range and within 360 days    Creatinine, Ser  Date Value Ref Range Status  06/22/2022 1.42 (H) 0.76 - 1.27 mg/dL Final         Passed - Valid encounter within last 12 months    Recent Outpatient Visits           2 weeks ago Primary hypertension   [place], Karen, NP   1 month ago Primary hypertension   [place], Karen, NP   2 months ago Encounter for annual wellness exam in Medicare patient   [place], Karen, NP   5 months ago Senile purpura ([place])   [place] Jon Billings, NP   8 months ago Senile purpura [facility])   [place] Jon Billings, NP       Future Appointments             In 2 weeks Jon Billings, NP [place], [facility]            Passed - CBC within normal limits and completed in the last 12 months    WBC  Date Value Ref Range Status  07/24/2022 8.7 3.4 - 10.8 x10E3/uL Final  07/17/2020 12.9 (H) 4.0 - 10.5 K/uL Final   RBC  Date Value Ref Range Status  07/24/2022 4.42 4.14 - 5.80 x10E6/uL Final  07/17/2020 3.87 (L) 4.22 - 5.81 MIL/uL Final    Hemoglobin  Date Value Ref Range Status  07/24/2022 13.7 13.0 - 17.7 g/dL Final   Hematocrit  Date Value Ref Range Status  07/24/2022 41.4 37.5 - 51.0 % Final   MCHC  Date Value Ref Range Status  07/24/2022 33.1 31.5 - 35.7 g/dL Final  07/17/2020 33.2 30.0 - 36.0 g/dL Final   [facility]  Date Value Ref Range Status  07/24/2022 31.0 26.6 - 33.0 pg Final  07/17/2020 31.0 26.0 - 34.0 pg Final   MCV  Date Value Ref Range Status  07/24/2022 94 79 - 97 fL Final   No results found for: "PLTCOUNTKUC", "LABPLAT", "POCPLA" RDW  Date Value Ref Range Status  07/24/2022 13.2 11.6 - 15.4 % Final

## 2022-09-07 NOTE — Progress Notes (Cosign Needed)
Care Management & Coordination Services Pharmacy Team  Reason for Encounter: Hypertension  Contacted patient to discuss hypertension disease state. Unsuccessful outreach. Left voicemail for patient to return call.   Recent office visits:  08/24/22-Karen Mathis Dad, NP (PCP) Seen for hyperlipidemia and hypertension. Increase valsartan to 80 mg daily. Follow up in 1 month. 07/24/22-Karen Mathis Dad, NP (PCP) Seen for hypertension. Will start on Valsartan 40 mg. Labs ordered. Follow up in 1 month. 06/22/22-Karen Mathis Dad, NP (PCP) Seen for annual wellness exam. Stop Metoprolol. Labs ordered. Flu vaccine given. Follow up in 1 month.  Recent consult visits:  None noted  Hospital visits:  None in previous 6 months  Medications: Outpatient Encounter Medications as of 09/07/2022  Medication Sig   allopurinol (ZYLOPRIM) 100 MG tablet TAKE 1 TABLET BY MOUTH DAILY   amiodarone (PACERONE) 200 MG tablet TAKE 1 TABLET BY MOUTH EVERY DAY   ferrous sulfate 325 (65 FE) MG tablet TAKE 1 TABLET (325MG ) BY MOUTH EVERY DAY   potassium chloride (KLOR-CON) 10 MEQ tablet TAKE 1 TABLET BY MOUTH TWICE  DAILY   simvastatin (ZOCOR) 20 MG tablet TAKE 1 TABLET BY MOUTH DAILY AT  6 PM.   valsartan (DIOVAN) 80 MG tablet Take 1 tablet (80 mg total) by mouth daily.   Zinc 220 (50 Zn) MG CAPS Take 1 capsule by mouth daily.   No facility-administered encounter medications on file as of 09/07/2022.    Recent Office Vitals: BP Readings from Last 3 Encounters:  08/24/22 (!) 198/68  07/24/22 (!) 202/72  06/22/22 138/68   Pulse Readings from Last 3 Encounters:  08/24/22 (!) 57  07/24/22 (!) 53  06/22/22 (!) 47    Wt Readings from Last 3 Encounters:  08/24/22 164 lb (74.4 kg)  07/24/22 165 lb 8 oz (75.1 kg)  06/22/22 160 lb (72.6 kg)     Kidney Function Lab Results  Component Value Date/Time   CREATININE 1.42 (H) 06/22/2022 10:30 AM   CREATININE 1.30 (H) 03/23/2022 09:52 AM   GFRNONAA 50 (L) 08/31/2020  01:29 PM   GFRNONAA 43 (L) 07/17/2020 01:52 AM   GFRAA 58 (L) 08/31/2020 01:29 PM       Latest Ref Rng & Units 06/22/2022   10:30 AM 03/23/2022    9:52 AM 12/21/2021    9:13 AM  BMP  Glucose 70 - 99 mg/dL 84  82  88   BUN 8 - 27 mg/dL 24  25  22    Creatinine 0.76 - 1.27 mg/dL 1.42  1.30  1.36   BUN/Creat Ratio 10 - 24 17  19  16    Sodium 134 - 144 mmol/L 140  140  139   Potassium 3.5 - 5.2 mmol/L 4.3  4.3  4.7   Chloride 96 - 106 mmol/L 106  102  104   CO2 20 - 29 mmol/L 21  23  22    Calcium 8.6 - 10.2 mg/dL 9.0  8.4  8.8   Current antihypertensive regimen:  Valsartan 80 mg take 1 tablet daily   Adherence Review: Is the patient currently on ACE/ARB medication? Yes Does the patient have >5 day gap between last estimated fill dates? No  Star Rating Drugs:  Valsartan 80 mg Last filled:07/24/22 90 DS Simvastatin 20 mg Last filled:08/25/22 100 DS, 05/22/22 100 DS   Myriam Elta [place], RMA

## 2022-09-27 ENCOUNTER — Ambulatory Visit (INDEPENDENT_AMBULATORY_CARE_PROVIDER_SITE_OTHER): Payer: Medicare Other | Admitting: Nurse Practitioner

## 2022-09-27 ENCOUNTER — Encounter: Payer: Self-pay | Admitting: Nurse Practitioner

## 2022-09-27 VITALS — BP 140/86 | HR 50 | Temp 97.5°F | Wt 167.0 lb

## 2022-09-27 DIAGNOSIS — M7918 Myalgia, other site: Secondary | ICD-10-CM | POA: Diagnosis not present

## 2022-09-27 DIAGNOSIS — I10 Essential (primary) hypertension: Secondary | ICD-10-CM | POA: Diagnosis not present

## 2022-09-27 MED ORDER — VALSARTAN 80 MG PO TABS: 80.0000 mg | ORAL_TABLET | Freq: Every day | ORAL | 1 refills | Status: DC

## 2022-09-27 NOTE — Progress Notes (Signed)
BP (!) 140/86   Pulse (!) 50   Temp (!) 97.5 F (36.4 C) (Oral)   Wt 167 lb (75.8 kg)   SpO2 99%   BMI 26.87 kg/m    Subjective:    Patient ID: Caleb Platt., male    DOB: [birth year], 89 y.o.   MRN: [id number]  HPI: Caleb Fleming is a 89 y.o. male  Chief Complaint  Patient presents with   Hypertension   HYPERTENSION with Chronic Kidney Disease Hypertension status: controlled  Satisfied with current treatment? yes Duration of hypertension: years BP monitoring frequency:  not checking BP range:  BP medication side effects:  no Medication compliance: excellent compliance Previous BP meds:valsartan Aspirin: no Recurrent headaches: no Visual changes: no Palpitations: no Dyspnea: no Chest pain: no Lower extremity edema: no Dizzy/lightheaded: no  Patient states he is having lower extremity pain.  Left leg is worse than right but having it in both legs.    Relevant past medical, surgical, family and social history reviewed and updated as indicated. Interim medical history since our last visit reviewed. Allergies and medications reviewed and updated.  Review of Systems  Eyes:  Negative for visual disturbance.  Respiratory:  Negative for shortness of breath.   Cardiovascular:  Negative for chest pain and leg swelling.  Musculoskeletal:  Positive for myalgias.  Neurological:  Negative for light-headedness and headaches.    Per HPI unless specifically indicated above     Objective:    BP (!) 140/86   Pulse (!) 50   Temp (!) 97.5 F (36.4 C) (Oral)   Wt 167 lb (75.8 kg)   SpO2 99%   BMI 26.87 kg/m   Wt Readings from Last 3 Encounters:  09/27/22 167 lb (75.8 kg)  08/24/22 164 lb (74.4 kg)  07/24/22 165 lb 8 oz (75.1 kg)    Physical Exam Vitals and nursing note reviewed.  Constitutional:      General: He is not in acute distress.    Appearance: Normal appearance. He is not ill-appearing, toxic-appearing or diaphoretic.  HENT:     Head:  Normocephalic.     Right Ear: External ear normal.     Left Ear: External ear normal.     Nose: Nose normal. No congestion or rhinorrhea.     Mouth/Throat:     Mouth: Mucous membranes are moist.  Eyes:     General:        Right eye: No discharge.        Left eye: No discharge.     Extraocular Movements: Extraocular movements intact.     Conjunctiva/sclera: Conjunctivae normal.     Pupils: Pupils are equal, round, and reactive to light.  Cardiovascular:     Rate and Rhythm: Normal rate and regular rhythm.     Heart sounds: No murmur heard. Pulmonary:     Effort: Pulmonary effort is normal. No respiratory distress.     Breath sounds: Normal breath sounds. No wheezing, rhonchi or rales.  Abdominal:     General: Abdomen is flat. Bowel sounds are normal.  Musculoskeletal:     Cervical back: Normal range of motion and neck supple.  Skin:    General: Skin is warm and dry.     Capillary Refill: Capillary refill takes less than 2 seconds.  Neurological:     General: No focal deficit present.     Mental Status: He is alert and oriented to person, place, and time.  Psychiatric:  Mood and Affect: Mood normal.        Behavior: Behavior normal.        Thought Content: Thought content normal.        Judgment: Judgment normal.     Results for orders placed or performed in visit on 07/24/22  CBC w/Diff  Result Value Ref Range   WBC 8.7 3.4 - 10.8 x10E3/uL   RBC 4.42 4.14 - 5.80 x10E6/uL   Hemoglobin 13.7 13.0 - 17.7 g/dL   Hematocrit 41.4 37.5 - 51.0 %   MCV 94 79 - 97 fL   MCH 31.0 26.6 - 33.0 pg   MCHC 33.1 31.5 - 35.7 g/dL   RDW 13.2 11.6 - 15.4 %   Platelets 251 150 - 450 x10E3/uL   Neutrophils 61 Not Estab. %   Lymphs 19 Not Estab. %   Monocytes 12 Not Estab. %   Eos 6 Not Estab. %   Basos 1 Not Estab. %   Neutrophils Absolute 5.3 1.4 - 7.0 x10E3/uL   Lymphocytes Absolute 1.7 0.7 - 3.1 x10E3/uL   Monocytes Absolute 1.1 (H) 0.1 - 0.9 x10E3/uL   EOS (ABSOLUTE) 0.5 (H)  0.0 - 0.4 x10E3/uL   Basophils Absolute 0.1 0.0 - 0.2 x10E3/uL   Immature Granulocytes 1 Not Estab. %   Immature Grans (Abs) 0.1 0.0 - 0.1 x10E3/uL      Assessment & Plan:   Problem List Items Addressed This Visit       Cardiovascular and Mediastinum   Hypertension - Primary    Chronic. Improved but not well controlled.  Patient only taking Valsartn 40mg .  Sent in new prescription to take 80mg  daily.  Follow up in 2 months.  Call sooner if concerns arise.       Relevant Medications   valsartan (DIOVAN) 80 MG tablet   Other Visit Diagnoses     Myalgia, lower leg       Suspect pain is due to arthritis. Patient does not have a ride to get xray. Will stop simvastatin to rule out that as a cause. If not improved, order xray of knees.         Follow up plan: Return in about 2 months (around 11/27/2022) for HTN, HLD, DM2 FU.

## 2022-09-27 NOTE — Assessment & Plan Note (Signed)
Chronic. Improved but not well controlled.  Patient only taking Valsartn 40mg .  Sent in new prescription to take 80mg  daily.  Follow up in 2 months.  Call sooner if concerns arise.

## 2022-09-29 ENCOUNTER — Other Ambulatory Visit: Payer: Self-pay | Admitting: Nurse Practitioner

## 2022-10-01 NOTE — Telephone Encounter (Signed)
Requested Prescriptions  Refused Prescriptions Disp Refills   metoprolol succinate (TOPROL-XL) 25 MG 24 hr tablet [Pharmacy Med Name: Metoprolol Succinate ER 25 MG Oral Tablet Extended Release 24 Hour] 80 tablet 3    Sig: TAKE 1 TABLET BY MOUTH DAILY     Cardiovascular:  Beta Blockers Failed - 09/29/2022 10:44 PM      Failed - Last BP in normal range    BP Readings from Last 1 Encounters:  09/27/22 (!) 140/86         Passed - Last Heart Rate in normal range    Pulse Readings from Last 1 Encounters:  09/27/22 (!) 50         Passed - Valid encounter within last 6 months    Recent Outpatient Visits           4 days ago Primary hypertension   [place], Karen, NP   1 month ago Primary hypertension   [place], Karen, NP   2 months ago Primary hypertension   [place], Karen, NP   3 months ago Encounter for annual wellness exam in Medicare patient   [place], NP   6 months ago Senile purpura [facility])   [place] Jon Billings, NP       Future Appointments             In 1 month Jon Billings, NP [place]

## 2022-10-08 DEATH — deceased

## 2022-10-10 ENCOUNTER — Telehealth: Payer: Self-pay | Admitting: Nurse Practitioner

## 2022-10-10 NOTE — Telephone Encounter (Signed)
Copied from [facility] [id number]. Topic: General - Deceased Patient >> 10/31/2022  2:26 PM Marcellus Scott wrote: Reason for CRM: [name] stated she wanted to let PCP know pt passed away 11/11/22.  Please advise.

## 2022-10-10 NOTE — Telephone Encounter (Signed)
Called and spoke with [name] and gave my condolences.

## 2022-10-18 ENCOUNTER — Other Ambulatory Visit: Payer: Self-pay | Admitting: Nurse Practitioner

## 2022-10-19 ENCOUNTER — Other Ambulatory Visit: Payer: Self-pay | Admitting: Nurse Practitioner

## 2022-10-29 ENCOUNTER — Other Ambulatory Visit: Payer: Self-pay | Admitting: Nurse Practitioner

## 2022-11-27 ENCOUNTER — Ambulatory Visit: Payer: Medicare Other | Admitting: Nurse Practitioner

## 2022-12-02 ENCOUNTER — Other Ambulatory Visit: Payer: Self-pay | Admitting: Nurse Practitioner
